# Patient Record
Sex: Female | Born: 1960 | Race: White | Hispanic: No | Marital: Single | Smoking: Never smoker
Health system: Southern US, Community
[De-identification: ages and names within clinical notes are randomized; demographics above are authoritative.]

## PROBLEM LIST (undated history)

## (undated) DIAGNOSIS — M1990 Unspecified osteoarthritis, unspecified site: Secondary | ICD-10-CM

## (undated) DIAGNOSIS — G35 Multiple sclerosis: Secondary | ICD-10-CM

## (undated) DIAGNOSIS — Z789 Other specified health status: Secondary | ICD-10-CM

## (undated) DIAGNOSIS — R350 Frequency of micturition: Secondary | ICD-10-CM

## (undated) DIAGNOSIS — R413 Other amnesia: Secondary | ICD-10-CM

## (undated) DIAGNOSIS — K269 Duodenal ulcer, unspecified as acute or chronic, without hemorrhage or perforation: Secondary | ICD-10-CM

## (undated) DIAGNOSIS — N319 Neuromuscular dysfunction of bladder, unspecified: Secondary | ICD-10-CM

## (undated) DIAGNOSIS — R269 Unspecified abnormalities of gait and mobility: Secondary | ICD-10-CM

## (undated) HISTORY — DX: Frequency of micturition: R35.0

## (undated) HISTORY — DX: Unspecified abnormalities of gait and mobility: R26.9

## (undated) HISTORY — DX: Neuromuscular dysfunction of bladder, unspecified: N31.9

## (undated) HISTORY — DX: Multiple sclerosis: G35

## (undated) HISTORY — DX: Other amnesia: R41.3

## (undated) HISTORY — PX: GALLBLADDER SURGERY: SHX652

## (undated) HISTORY — PX: CHOLECYSTECTOMY: SHX55

## (undated) HISTORY — DX: Unspecified osteoarthritis, unspecified site: M19.90

---

## 2013-04-19 DIAGNOSIS — G3184 Mild cognitive impairment, so stated: Secondary | ICD-10-CM | POA: Insufficient documentation

## 2013-04-19 DIAGNOSIS — R300 Dysuria: Secondary | ICD-10-CM | POA: Insufficient documentation

## 2013-07-05 ENCOUNTER — Ambulatory Visit: Payer: Self-pay | Admitting: Family Medicine

## 2013-09-24 ENCOUNTER — Encounter: Payer: Self-pay | Admitting: Neurology

## 2013-09-25 ENCOUNTER — Telehealth: Payer: Self-pay | Admitting: Neurology

## 2013-09-25 NOTE — Telephone Encounter (Signed)
ERROR

## 2013-09-26 ENCOUNTER — Ambulatory Visit (INDEPENDENT_AMBULATORY_CARE_PROVIDER_SITE_OTHER): Payer: Medicare HMO | Admitting: Neurology

## 2013-09-26 ENCOUNTER — Encounter: Payer: Self-pay | Admitting: Neurology

## 2013-09-26 VITALS — BP 118/80 | HR 66 | Ht 65.0 in | Wt 159.0 lb

## 2013-09-26 DIAGNOSIS — R269 Unspecified abnormalities of gait and mobility: Secondary | ICD-10-CM | POA: Insufficient documentation

## 2013-09-26 DIAGNOSIS — G35 Multiple sclerosis: Secondary | ICD-10-CM | POA: Insufficient documentation

## 2013-09-26 HISTORY — DX: Multiple sclerosis: G35

## 2013-09-26 HISTORY — DX: Unspecified abnormalities of gait and mobility: R26.9

## 2013-09-26 NOTE — Progress Notes (Signed)
Reason for visit: Multiple sclerosis  Adrienne Strickland is a 52 y.o. female  History of present illness:  Adrienne Strickland is a 52 year old left-handed white female with a history of multiple sclerosis that was initially diagnosed in 1995 following a bout of optic neuritis affecting the left eye. The patient indicates that she was initially placed on Avonex, but then she was changed to Betaseron because of some progression. The patient could not tolerate Betaseron, and she was switched to Copaxone, and she was having issues with progression on this medication as well. Eventually in November 2006, the patient was placed on Tysabri. 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 The patient went off the medication at that time, and within 3-4 months, the patient began having significant problems with progression of symptoms. The patient underwent MRI evaluation of the brain showing ring-enhancing lesions, and the patient had MRI evaluation of the cervical spine that revealed spinal cord lesions as well with some question of cord compression at C4 level. Cord signal was noted at this level as well. The patient has cortical atrophy and ventriculomegaly associated with her disease. Currently, the patient is on Tecfidera, and she has been on this medication for several months as she did not feel well on Gilenya that was started in January 2014. The patient has had some cognitive changes off of Tysabri, along with fatigue, spasticity the legs, and worsening of gait. The patient notes that her right leg will go into spasms, but she is only taking baclofen on an as-needed basis. The patient is using a cane in and outside the house, with occasional falls. The patient has some urinary incontinence, and she has blurring of vision  bilaterally. The patient reports no new numbness or weakness of the face, arms, or legs. The patient takes Ampyra for her gait. The patient is sent to this office for a second opinion regarding the treatment of her multiple sclerosis.  Past Medical History  Diagnosis Date  . MS (multiple sclerosis)   . Arthritis   . Multiple sclerosis 09/26/2013  . Abnormality of gait 09/26/2013  . Memory difficulty   . Neurogenic bladder     Past Surgical History  Procedure Laterality Date  . Gallbladder surgery      Family History  Problem Relation Age of Onset  . Seizures Mother   . Emphysema Father   . Heart disease Brother     Social history:  reports that she has never smoked. She has never used smokeless tobacco. She reports that  drinks alcohol. She reports that she does not use illicit drugs.  Medications:  Current Outpatient Prescriptions on File Prior to Visit  Medication Sig Dispense Refill  . baclofen (LIORESAL) 10 MG tablet Take 10 mg by mouth every 6 (six) hours.       Marland Kitchen dalfampridine (AMPYRA) 10 MG TB12 Take 10 mg by mouth 2 (two) times daily.      . Dimethyl Fumarate (TECFIDERA) 240 MG CPDR Take 240 mg by mouth 2 (two) times daily.       No current facility-administered medications on file prior to visit.      Allergies  Allergen Reactions  . Sulfa Antibiotics Other (See Comments)  . Tetanus Toxoids     ROS:  Out of a complete 14 system  review of symptoms, the patient complains only of the following symptoms, and all other reviewed systems are negative.  Fatigue Swelling in the legs Incontinence of bladder Flushing Runny nose  Blood pressure 118/80, pulse 66, height 5\' 5"  (1.651 m), weight 159 lb (72.122 kg).  Physical Exam  General: The patient is alert and cooperative at the time of the examination.  Head: Pupils are equal, round, and reactive to light. Discs are flat bilaterally. Some optic atrophy is seen on the right.  Neck: The neck is supple, no  carotid bruits are noted.  Respiratory: The respiratory examination is clear.  Cardiovascular: The cardiovascular examination reveals a regular rate and rhythm, no obvious murmurs or rubs are noted.  Skin: Extremities are with 1+ edema ankles bilaterally.  Neurologic Exam  Mental status:  Cranial nerves: Facial symmetry is present. There is good sensation of the face to pinprick and soft touch bilaterally. The strength of the facial muscles and the muscles to head turning and shoulder shrug are normal bilaterally. Speech is well enunciated, no aphasia or dysarthria is noted. Extraocular movements are full. Visual fields are full. Titubations of the head and upper body are noted.  Motor: The motor testing reveals 5 over 5 strength of all 4 extremities. Good symmetric motor tone is noted throughout.  Sensory: Sensory testing is intact to pinprick, soft touch, vibration sensation, and position sense on all 4 extremities, with the exception that there is some decrease in vibration sensation and position sensation of the right foot. No evidence of extinction is noted.  Coordination: Cerebellar testing reveals a mild intention tremor, dysmetria involving finger-nose-finger bilaterally, and heel-to-shin bilaterally.  Gait and station: Gait is associated with a wide-based, unsteady stance. The patient uses a cane for ambulation. Tandem gait is slightly unsteady. Romberg is negative. No drift is seen.  Reflexes: Deep tendon reflexes are symmetric, but are slightly increased bilaterally. Toes are upgoing bilaterally.   Assessment/Plan:  1. Multiple sclerosis  2. Memory disorder  3. Gait disorder  The patient has significant deficits associated with her multiple sclerosis with a very active MRI evaluation of the brain and cervical spinal cord in April 2014. The patient is now on Tecfidera, and she is tolerating the medication fairly well. The severe exacerbation seen previously this year may in  part have been related to the withdrawal from Tysabri. The patient will be sent for MRI evaluation of the brain and cervical spinal cord. If the patient does truly have spinal cord compression, surgery may be indicated. The patient is at high risk for PML with Tysabri therapy. Even so, Tysabri could be considered if the patient has significant worsening of disease, and she fully understands the risks involved with the use of this medication, and she is willing to accept these risks. The patient will followup through this office if needed.  Marlan Palau MD 09/26/2013 7:19 PM  [facility] [address] [place], [place] [address]  Phone [phone] Fax [phone]

## 2013-10-26 ENCOUNTER — Ambulatory Visit
Admission: RE | Admit: 2013-10-26 | Discharge: 2013-10-26 | Disposition: A | Discharge status: Home / Self Care | Payer: Medicare Other | Source: Ambulatory Visit | Attending: Neurology | Admitting: Neurology

## 2013-10-26 ENCOUNTER — Ambulatory Visit
Admission: RE | Admit: 2013-10-26 | Discharge: 2013-10-26 | Disposition: A | Discharge status: Home / Self Care | Payer: Medicare HMO | Source: Ambulatory Visit | Attending: Neurology | Admitting: Neurology

## 2013-10-26 DIAGNOSIS — R269 Unspecified abnormalities of gait and mobility: Secondary | ICD-10-CM

## 2013-10-26 DIAGNOSIS — G35 Multiple sclerosis: Secondary | ICD-10-CM

## 2013-10-26 MED ORDER — GADOBENATE DIMEGLUMINE 529 MG/ML IV SOLN
15.0000 mL | Freq: Once | INTRAVENOUS | Status: AC | PRN
  Administered 2013-10-26: 15 mL via INTRAVENOUS

## 2013-10-28 ENCOUNTER — Telehealth: Payer: Self-pay | Admitting: Neurology

## 2013-10-28 NOTE — Telephone Encounter (Signed)
I called patient. The MRI the cervical spine does show some moderate level spinal stenosis without critical cord compression. MRI the brain shows multiple enhancing lesions suggesting that the MS is rather active. The patient has been on Tecfidera for about 3 months. The patient indicates that the JC viral antibody index was extremely high, putting her at significant risk for PML. Going back on this medication could be done, but the patient may be sustaining a very high level of risk. The patient could not tolerate Gilenya secondary to cognitive issues, and switching to Aubagio probably will offer very little in improving disease progression. Would recommend potentially giving the patient Solu-Medrol injections once every 2 weeks or once a month. We'll continue to Tecfidera for now, considering repeating another MRI of the brain in 3 months with contrast.

## 2013-12-12 DIAGNOSIS — G35 Multiple sclerosis: Secondary | ICD-10-CM | POA: Diagnosis not present

## 2013-12-12 DIAGNOSIS — R9409 Abnormal results of other function studies of central nervous system: Secondary | ICD-10-CM | POA: Diagnosis not present

## 2013-12-13 DIAGNOSIS — G609 Hereditary and idiopathic neuropathy, unspecified: Secondary | ICD-10-CM | POA: Diagnosis not present

## 2013-12-13 DIAGNOSIS — G35 Multiple sclerosis: Secondary | ICD-10-CM | POA: Diagnosis not present

## 2013-12-13 DIAGNOSIS — N319 Neuromuscular dysfunction of bladder, unspecified: Secondary | ICD-10-CM | POA: Diagnosis not present

## 2013-12-13 DIAGNOSIS — F801 Expressive language disorder: Secondary | ICD-10-CM | POA: Diagnosis not present

## 2013-12-16 DIAGNOSIS — G609 Hereditary and idiopathic neuropathy, unspecified: Secondary | ICD-10-CM | POA: Diagnosis not present

## 2013-12-16 DIAGNOSIS — F801 Expressive language disorder: Secondary | ICD-10-CM | POA: Diagnosis not present

## 2013-12-16 DIAGNOSIS — G35 Multiple sclerosis: Secondary | ICD-10-CM | POA: Diagnosis not present

## 2013-12-16 DIAGNOSIS — N319 Neuromuscular dysfunction of bladder, unspecified: Secondary | ICD-10-CM | POA: Diagnosis not present

## 2013-12-17 DIAGNOSIS — F801 Expressive language disorder: Secondary | ICD-10-CM | POA: Diagnosis not present

## 2013-12-17 DIAGNOSIS — G35 Multiple sclerosis: Secondary | ICD-10-CM | POA: Diagnosis not present

## 2013-12-17 DIAGNOSIS — N319 Neuromuscular dysfunction of bladder, unspecified: Secondary | ICD-10-CM | POA: Diagnosis not present

## 2013-12-17 DIAGNOSIS — G609 Hereditary and idiopathic neuropathy, unspecified: Secondary | ICD-10-CM | POA: Diagnosis not present

## 2013-12-18 DIAGNOSIS — G35 Multiple sclerosis: Secondary | ICD-10-CM | POA: Diagnosis not present

## 2013-12-18 DIAGNOSIS — N319 Neuromuscular dysfunction of bladder, unspecified: Secondary | ICD-10-CM | POA: Diagnosis not present

## 2013-12-18 DIAGNOSIS — G609 Hereditary and idiopathic neuropathy, unspecified: Secondary | ICD-10-CM | POA: Diagnosis not present

## 2013-12-18 DIAGNOSIS — F801 Expressive language disorder: Secondary | ICD-10-CM | POA: Diagnosis not present

## 2013-12-20 DIAGNOSIS — G609 Hereditary and idiopathic neuropathy, unspecified: Secondary | ICD-10-CM | POA: Diagnosis not present

## 2013-12-20 DIAGNOSIS — F801 Expressive language disorder: Secondary | ICD-10-CM | POA: Diagnosis not present

## 2013-12-20 DIAGNOSIS — N319 Neuromuscular dysfunction of bladder, unspecified: Secondary | ICD-10-CM | POA: Diagnosis not present

## 2013-12-20 DIAGNOSIS — G35 Multiple sclerosis: Secondary | ICD-10-CM | POA: Diagnosis not present

## 2013-12-23 DIAGNOSIS — G35 Multiple sclerosis: Secondary | ICD-10-CM | POA: Diagnosis not present

## 2013-12-23 DIAGNOSIS — Z124 Encounter for screening for malignant neoplasm of cervix: Secondary | ICD-10-CM | POA: Diagnosis not present

## 2013-12-23 DIAGNOSIS — Z01419 Encounter for gynecological examination (general) (routine) without abnormal findings: Secondary | ICD-10-CM | POA: Diagnosis not present

## 2013-12-23 DIAGNOSIS — Z5181 Encounter for therapeutic drug level monitoring: Secondary | ICD-10-CM | POA: Diagnosis not present

## 2013-12-23 DIAGNOSIS — N319 Neuromuscular dysfunction of bladder, unspecified: Secondary | ICD-10-CM | POA: Diagnosis not present

## 2013-12-23 DIAGNOSIS — Z79899 Other long term (current) drug therapy: Secondary | ICD-10-CM | POA: Diagnosis not present

## 2013-12-23 DIAGNOSIS — R8789 Other abnormal findings in specimens from female genital organs: Secondary | ICD-10-CM | POA: Diagnosis not present

## 2013-12-24 DIAGNOSIS — F801 Expressive language disorder: Secondary | ICD-10-CM | POA: Diagnosis not present

## 2013-12-24 DIAGNOSIS — N319 Neuromuscular dysfunction of bladder, unspecified: Secondary | ICD-10-CM | POA: Diagnosis not present

## 2013-12-24 DIAGNOSIS — G35 Multiple sclerosis: Secondary | ICD-10-CM | POA: Diagnosis not present

## 2013-12-24 DIAGNOSIS — G609 Hereditary and idiopathic neuropathy, unspecified: Secondary | ICD-10-CM | POA: Diagnosis not present

## 2013-12-26 DIAGNOSIS — G35 Multiple sclerosis: Secondary | ICD-10-CM | POA: Diagnosis not present

## 2013-12-26 DIAGNOSIS — F801 Expressive language disorder: Secondary | ICD-10-CM | POA: Diagnosis not present

## 2013-12-26 DIAGNOSIS — G609 Hereditary and idiopathic neuropathy, unspecified: Secondary | ICD-10-CM | POA: Diagnosis not present

## 2013-12-26 DIAGNOSIS — N319 Neuromuscular dysfunction of bladder, unspecified: Secondary | ICD-10-CM | POA: Diagnosis not present

## 2013-12-30 DIAGNOSIS — G35 Multiple sclerosis: Secondary | ICD-10-CM | POA: Diagnosis not present

## 2013-12-30 DIAGNOSIS — G609 Hereditary and idiopathic neuropathy, unspecified: Secondary | ICD-10-CM | POA: Diagnosis not present

## 2013-12-30 DIAGNOSIS — N319 Neuromuscular dysfunction of bladder, unspecified: Secondary | ICD-10-CM | POA: Diagnosis not present

## 2013-12-30 DIAGNOSIS — F801 Expressive language disorder: Secondary | ICD-10-CM | POA: Diagnosis not present

## 2013-12-31 DIAGNOSIS — G35 Multiple sclerosis: Secondary | ICD-10-CM | POA: Diagnosis not present

## 2013-12-31 DIAGNOSIS — F801 Expressive language disorder: Secondary | ICD-10-CM | POA: Diagnosis not present

## 2013-12-31 DIAGNOSIS — N319 Neuromuscular dysfunction of bladder, unspecified: Secondary | ICD-10-CM | POA: Diagnosis not present

## 2013-12-31 DIAGNOSIS — N760 Acute vaginitis: Secondary | ICD-10-CM | POA: Diagnosis not present

## 2013-12-31 DIAGNOSIS — G609 Hereditary and idiopathic neuropathy, unspecified: Secondary | ICD-10-CM | POA: Diagnosis not present

## 2014-01-01 DIAGNOSIS — G609 Hereditary and idiopathic neuropathy, unspecified: Secondary | ICD-10-CM | POA: Diagnosis not present

## 2014-01-01 DIAGNOSIS — F801 Expressive language disorder: Secondary | ICD-10-CM | POA: Diagnosis not present

## 2014-01-01 DIAGNOSIS — G35 Multiple sclerosis: Secondary | ICD-10-CM | POA: Diagnosis not present

## 2014-01-01 DIAGNOSIS — N319 Neuromuscular dysfunction of bladder, unspecified: Secondary | ICD-10-CM | POA: Diagnosis not present

## 2014-01-03 DIAGNOSIS — N319 Neuromuscular dysfunction of bladder, unspecified: Secondary | ICD-10-CM | POA: Diagnosis not present

## 2014-01-03 DIAGNOSIS — G35 Multiple sclerosis: Secondary | ICD-10-CM | POA: Diagnosis not present

## 2014-01-03 DIAGNOSIS — G609 Hereditary and idiopathic neuropathy, unspecified: Secondary | ICD-10-CM | POA: Diagnosis not present

## 2014-01-03 DIAGNOSIS — F801 Expressive language disorder: Secondary | ICD-10-CM | POA: Diagnosis not present

## 2014-01-06 DIAGNOSIS — N319 Neuromuscular dysfunction of bladder, unspecified: Secondary | ICD-10-CM | POA: Diagnosis not present

## 2014-01-06 DIAGNOSIS — G35 Multiple sclerosis: Secondary | ICD-10-CM | POA: Diagnosis not present

## 2014-01-06 DIAGNOSIS — F801 Expressive language disorder: Secondary | ICD-10-CM | POA: Diagnosis not present

## 2014-01-06 DIAGNOSIS — G609 Hereditary and idiopathic neuropathy, unspecified: Secondary | ICD-10-CM | POA: Diagnosis not present

## 2014-01-07 DIAGNOSIS — F801 Expressive language disorder: Secondary | ICD-10-CM | POA: Diagnosis not present

## 2014-01-07 DIAGNOSIS — G609 Hereditary and idiopathic neuropathy, unspecified: Secondary | ICD-10-CM | POA: Diagnosis not present

## 2014-01-07 DIAGNOSIS — G35 Multiple sclerosis: Secondary | ICD-10-CM | POA: Diagnosis not present

## 2014-01-07 DIAGNOSIS — N319 Neuromuscular dysfunction of bladder, unspecified: Secondary | ICD-10-CM | POA: Diagnosis not present

## 2014-01-09 DIAGNOSIS — N319 Neuromuscular dysfunction of bladder, unspecified: Secondary | ICD-10-CM | POA: Diagnosis not present

## 2014-01-09 DIAGNOSIS — G35 Multiple sclerosis: Secondary | ICD-10-CM | POA: Diagnosis not present

## 2014-01-09 DIAGNOSIS — G609 Hereditary and idiopathic neuropathy, unspecified: Secondary | ICD-10-CM | POA: Diagnosis not present

## 2014-01-09 DIAGNOSIS — F801 Expressive language disorder: Secondary | ICD-10-CM | POA: Diagnosis not present

## 2014-01-10 DIAGNOSIS — R269 Unspecified abnormalities of gait and mobility: Secondary | ICD-10-CM | POA: Diagnosis not present

## 2014-01-10 DIAGNOSIS — G35 Multiple sclerosis: Secondary | ICD-10-CM | POA: Diagnosis not present

## 2014-01-10 DIAGNOSIS — Z139 Encounter for screening, unspecified: Secondary | ICD-10-CM | POA: Diagnosis not present

## 2014-01-13 DIAGNOSIS — N319 Neuromuscular dysfunction of bladder, unspecified: Secondary | ICD-10-CM | POA: Diagnosis not present

## 2014-01-13 DIAGNOSIS — G609 Hereditary and idiopathic neuropathy, unspecified: Secondary | ICD-10-CM | POA: Diagnosis not present

## 2014-01-13 DIAGNOSIS — G35 Multiple sclerosis: Secondary | ICD-10-CM | POA: Diagnosis not present

## 2014-01-13 DIAGNOSIS — F801 Expressive language disorder: Secondary | ICD-10-CM | POA: Diagnosis not present

## 2014-01-15 DIAGNOSIS — N319 Neuromuscular dysfunction of bladder, unspecified: Secondary | ICD-10-CM | POA: Diagnosis not present

## 2014-01-15 DIAGNOSIS — G35 Multiple sclerosis: Secondary | ICD-10-CM | POA: Diagnosis not present

## 2014-01-15 DIAGNOSIS — F801 Expressive language disorder: Secondary | ICD-10-CM | POA: Diagnosis not present

## 2014-01-15 DIAGNOSIS — G609 Hereditary and idiopathic neuropathy, unspecified: Secondary | ICD-10-CM | POA: Diagnosis not present

## 2014-01-16 DIAGNOSIS — F801 Expressive language disorder: Secondary | ICD-10-CM | POA: Diagnosis not present

## 2014-01-16 DIAGNOSIS — G35 Multiple sclerosis: Secondary | ICD-10-CM | POA: Diagnosis not present

## 2014-01-16 DIAGNOSIS — N319 Neuromuscular dysfunction of bladder, unspecified: Secondary | ICD-10-CM | POA: Diagnosis not present

## 2014-01-16 DIAGNOSIS — G609 Hereditary and idiopathic neuropathy, unspecified: Secondary | ICD-10-CM | POA: Diagnosis not present

## 2014-01-17 DIAGNOSIS — F801 Expressive language disorder: Secondary | ICD-10-CM | POA: Diagnosis not present

## 2014-01-17 DIAGNOSIS — N319 Neuromuscular dysfunction of bladder, unspecified: Secondary | ICD-10-CM | POA: Diagnosis not present

## 2014-01-17 DIAGNOSIS — G35 Multiple sclerosis: Secondary | ICD-10-CM | POA: Diagnosis not present

## 2014-01-17 DIAGNOSIS — G609 Hereditary and idiopathic neuropathy, unspecified: Secondary | ICD-10-CM | POA: Diagnosis not present

## 2014-01-20 DIAGNOSIS — F801 Expressive language disorder: Secondary | ICD-10-CM | POA: Diagnosis not present

## 2014-01-20 DIAGNOSIS — G609 Hereditary and idiopathic neuropathy, unspecified: Secondary | ICD-10-CM | POA: Diagnosis not present

## 2014-01-20 DIAGNOSIS — N319 Neuromuscular dysfunction of bladder, unspecified: Secondary | ICD-10-CM | POA: Diagnosis not present

## 2014-01-20 DIAGNOSIS — G35 Multiple sclerosis: Secondary | ICD-10-CM | POA: Diagnosis not present

## 2014-01-21 DIAGNOSIS — F801 Expressive language disorder: Secondary | ICD-10-CM | POA: Diagnosis not present

## 2014-01-21 DIAGNOSIS — G35 Multiple sclerosis: Secondary | ICD-10-CM | POA: Diagnosis not present

## 2014-01-21 DIAGNOSIS — G609 Hereditary and idiopathic neuropathy, unspecified: Secondary | ICD-10-CM | POA: Diagnosis not present

## 2014-01-21 DIAGNOSIS — N319 Neuromuscular dysfunction of bladder, unspecified: Secondary | ICD-10-CM | POA: Diagnosis not present

## 2014-01-22 DIAGNOSIS — F801 Expressive language disorder: Secondary | ICD-10-CM | POA: Diagnosis not present

## 2014-01-22 DIAGNOSIS — G35 Multiple sclerosis: Secondary | ICD-10-CM | POA: Diagnosis not present

## 2014-01-22 DIAGNOSIS — G609 Hereditary and idiopathic neuropathy, unspecified: Secondary | ICD-10-CM | POA: Diagnosis not present

## 2014-01-22 DIAGNOSIS — N319 Neuromuscular dysfunction of bladder, unspecified: Secondary | ICD-10-CM | POA: Diagnosis not present

## 2014-01-24 DIAGNOSIS — G35 Multiple sclerosis: Secondary | ICD-10-CM | POA: Diagnosis not present

## 2014-01-24 DIAGNOSIS — F801 Expressive language disorder: Secondary | ICD-10-CM | POA: Diagnosis not present

## 2014-01-24 DIAGNOSIS — N319 Neuromuscular dysfunction of bladder, unspecified: Secondary | ICD-10-CM | POA: Diagnosis not present

## 2014-01-24 DIAGNOSIS — G609 Hereditary and idiopathic neuropathy, unspecified: Secondary | ICD-10-CM | POA: Diagnosis not present

## 2014-01-30 DIAGNOSIS — F801 Expressive language disorder: Secondary | ICD-10-CM | POA: Diagnosis not present

## 2014-01-30 DIAGNOSIS — N319 Neuromuscular dysfunction of bladder, unspecified: Secondary | ICD-10-CM | POA: Diagnosis not present

## 2014-01-30 DIAGNOSIS — G609 Hereditary and idiopathic neuropathy, unspecified: Secondary | ICD-10-CM | POA: Diagnosis not present

## 2014-01-30 DIAGNOSIS — G35 Multiple sclerosis: Secondary | ICD-10-CM | POA: Diagnosis not present

## 2014-04-12 DIAGNOSIS — G35 Multiple sclerosis: Secondary | ICD-10-CM | POA: Diagnosis not present

## 2014-04-12 DIAGNOSIS — IMO0001 Reserved for inherently not codable concepts without codable children: Secondary | ICD-10-CM | POA: Diagnosis not present

## 2014-04-17 DIAGNOSIS — G35 Multiple sclerosis: Secondary | ICD-10-CM | POA: Diagnosis not present

## 2014-04-17 DIAGNOSIS — IMO0001 Reserved for inherently not codable concepts without codable children: Secondary | ICD-10-CM | POA: Diagnosis not present

## 2014-04-18 DIAGNOSIS — IMO0001 Reserved for inherently not codable concepts without codable children: Secondary | ICD-10-CM | POA: Diagnosis not present

## 2014-04-18 DIAGNOSIS — G35 Multiple sclerosis: Secondary | ICD-10-CM | POA: Diagnosis not present

## 2014-04-24 DIAGNOSIS — IMO0001 Reserved for inherently not codable concepts without codable children: Secondary | ICD-10-CM | POA: Diagnosis not present

## 2014-04-24 DIAGNOSIS — G35 Multiple sclerosis: Secondary | ICD-10-CM | POA: Diagnosis not present

## 2014-05-06 DIAGNOSIS — G35 Multiple sclerosis: Secondary | ICD-10-CM | POA: Diagnosis not present

## 2014-05-06 DIAGNOSIS — R269 Unspecified abnormalities of gait and mobility: Secondary | ICD-10-CM | POA: Diagnosis not present

## 2014-05-20 DIAGNOSIS — M79609 Pain in unspecified limb: Secondary | ICD-10-CM | POA: Insufficient documentation

## 2014-05-20 DIAGNOSIS — G35 Multiple sclerosis: Secondary | ICD-10-CM | POA: Diagnosis not present

## 2014-05-21 DIAGNOSIS — N319 Neuromuscular dysfunction of bladder, unspecified: Secondary | ICD-10-CM | POA: Diagnosis not present

## 2014-05-21 DIAGNOSIS — R198 Other specified symptoms and signs involving the digestive system and abdomen: Secondary | ICD-10-CM | POA: Diagnosis not present

## 2014-05-21 DIAGNOSIS — Z1211 Encounter for screening for malignant neoplasm of colon: Secondary | ICD-10-CM | POA: Diagnosis not present

## 2014-05-21 DIAGNOSIS — G35 Multiple sclerosis: Secondary | ICD-10-CM | POA: Diagnosis not present

## 2014-05-21 DIAGNOSIS — K5289 Other specified noninfective gastroenteritis and colitis: Secondary | ICD-10-CM | POA: Diagnosis not present

## 2014-05-21 DIAGNOSIS — Z01419 Encounter for gynecological examination (general) (routine) without abnormal findings: Secondary | ICD-10-CM | POA: Diagnosis not present

## 2014-05-22 ENCOUNTER — Other Ambulatory Visit: Payer: Self-pay | Admitting: Neurology

## 2014-05-22 DIAGNOSIS — G35 Multiple sclerosis: Secondary | ICD-10-CM

## 2014-05-27 ENCOUNTER — Other Ambulatory Visit: Payer: Self-pay | Admitting: Neurology

## 2014-05-27 DIAGNOSIS — G35 Multiple sclerosis: Secondary | ICD-10-CM

## 2014-05-28 ENCOUNTER — Emergency Department: Payer: Self-pay | Admitting: Emergency Medicine

## 2014-05-28 DIAGNOSIS — Z79899 Other long term (current) drug therapy: Secondary | ICD-10-CM | POA: Diagnosis not present

## 2014-05-28 DIAGNOSIS — G35 Multiple sclerosis: Secondary | ICD-10-CM | POA: Diagnosis not present

## 2014-05-28 DIAGNOSIS — R109 Unspecified abdominal pain: Secondary | ICD-10-CM | POA: Diagnosis not present

## 2014-05-28 DIAGNOSIS — K5900 Constipation, unspecified: Secondary | ICD-10-CM | POA: Diagnosis not present

## 2014-05-28 DIAGNOSIS — N319 Neuromuscular dysfunction of bladder, unspecified: Secondary | ICD-10-CM | POA: Diagnosis not present

## 2014-05-30 ENCOUNTER — Ambulatory Visit
Admission: RE | Admit: 2014-05-30 | Discharge: 2014-05-30 | Disposition: A | Discharge status: Home / Self Care | Payer: Medicare Other | Source: Ambulatory Visit | Attending: Neurology | Admitting: Neurology

## 2014-05-30 DIAGNOSIS — G35 Multiple sclerosis: Secondary | ICD-10-CM

## 2014-05-30 MED ORDER — GADOBENATE DIMEGLUMINE 529 MG/ML IV SOLN
13.0000 mL | Freq: Once | INTRAVENOUS | Status: AC | PRN
  Administered 2014-05-30: 13 mL via INTRAVENOUS

## 2014-09-09 DIAGNOSIS — R350 Frequency of micturition: Secondary | ICD-10-CM | POA: Diagnosis not present

## 2014-09-09 DIAGNOSIS — N319 Neuromuscular dysfunction of bladder, unspecified: Secondary | ICD-10-CM | POA: Diagnosis not present

## 2014-09-30 DIAGNOSIS — I10 Essential (primary) hypertension: Secondary | ICD-10-CM | POA: Diagnosis not present

## 2014-09-30 DIAGNOSIS — N319 Neuromuscular dysfunction of bladder, unspecified: Secondary | ICD-10-CM | POA: Diagnosis not present

## 2014-09-30 DIAGNOSIS — R350 Frequency of micturition: Secondary | ICD-10-CM | POA: Diagnosis not present

## 2014-11-18 DIAGNOSIS — Z1239 Encounter for other screening for malignant neoplasm of breast: Secondary | ICD-10-CM | POA: Diagnosis not present

## 2014-11-18 DIAGNOSIS — M79641 Pain in right hand: Secondary | ICD-10-CM | POA: Diagnosis not present

## 2014-11-18 DIAGNOSIS — Z23 Encounter for immunization: Secondary | ICD-10-CM | POA: Diagnosis not present

## 2014-12-01 DIAGNOSIS — G35 Multiple sclerosis: Secondary | ICD-10-CM | POA: Diagnosis not present

## 2014-12-23 DIAGNOSIS — G35 Multiple sclerosis: Secondary | ICD-10-CM | POA: Diagnosis not present

## 2014-12-23 DIAGNOSIS — R2681 Unsteadiness on feet: Secondary | ICD-10-CM | POA: Diagnosis not present

## 2015-01-20 DIAGNOSIS — M1990 Unspecified osteoarthritis, unspecified site: Secondary | ICD-10-CM | POA: Insufficient documentation

## 2015-02-27 ENCOUNTER — Inpatient Hospital Stay: Payer: Self-pay | Admitting: Internal Medicine

## 2015-02-27 DIAGNOSIS — K269 Duodenal ulcer, unspecified as acute or chronic, without hemorrhage or perforation: Secondary | ICD-10-CM

## 2015-02-27 HISTORY — DX: Duodenal ulcer, unspecified as acute or chronic, without hemorrhage or perforation: K26.9

## 2015-04-24 DIAGNOSIS — K08409 Partial loss of teeth, unspecified cause, unspecified class: Secondary | ICD-10-CM | POA: Insufficient documentation

## 2015-04-24 DIAGNOSIS — K0889 Other specified disorders of teeth and supporting structures: Secondary | ICD-10-CM | POA: Insufficient documentation

## 2015-04-24 DIAGNOSIS — K264 Chronic or unspecified duodenal ulcer with hemorrhage: Secondary | ICD-10-CM | POA: Insufficient documentation

## 2015-04-26 NOTE — Consult Note (Signed)
PATIENT NAME:  Adrienne Strickland, Adrienne Strickland MR#:  [id number] DATE OF BIRTH:  05-21-1961  DATE OF CONSULTATION:  02/27/2015  REFERRING PHYSICIAN:  Crissie Figures, MD CONSULTING PHYSICIAN:  Christena Deem, MD/Marabelle Cushman A. Arvilla Market, ANP (Adult Nurse Practitioner)  PRIMARY CARE PRACTITIONER:  Earna Coder. Hurd, MD  REASON FOR CONSULTATION: Hematemesis, anemia with blood loss.   HISTORY OF PRESENT ILLNESS: This 54 year old Caucasian female with a history of multiple sclerosis, chronic arthritis on chronic Etoladac and diclofenac reports a 1-week history of feeling weak. Two days ago, she developed acute episode of nausea followed quickly by vomiting red emesis x2. The first emesis was red. She did not have diarrhea initially. She did rest on Wednesday, continued to feel weak. She  woke up Thursday feeling a little better. She was able to eat a sandwich that evening. She went to bed and woke up at about 1:00 last night with acute episode of nausea and vomiting, more hematemesis, and this time she had horrible black diarrhea. The patient felt weak, dizzy, presyncopal and could not ambulate. EMS was called. She was found to have hypotension with pallor and diaphoresis. She was brought to the Emergency Room.   In the Emergency Room, she was found to have a hemoglobin of 7.3. She was given 2 liters of normal saline. Her vital signs improved. She has subsequently received 2 units of PRBC and her hemoglobin is now at 7.9. She has had no further vomiting or diarrhea since 1:00 this morning. She says she feels pretty well and is feeling stronger after the blood transfusion, although she is not ambulating.   The patient denies history of heartburn or reflux. She has never had a history of ulcers. No prior history of EGD. She did have a colonoscopy done 2 years ago in [place] and reports negative findings. She says she is not due for another one for 10 years. The patient does take chronic NSAIDs in the form of diclofenac as well as  Etodolac for many years. She has significant arthritis. The patient does not take a proton pump inhibitor.   PAST MEDICAL HISTORY:  1. 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 The patient took Tysabri for 6 years. She had to stop Tysabri secondary to a virus.  2. Chronic arthritis.  3. Neurogenic bladder on intermittent self-catheterization.   PAST SURGICAL HISTORY: Cholecystectomy.   ALLERGIES: SULFA AND TDAP.   FAMILY HISTORY: Father with gastric ulcers, neurofibromatosis, and he is deceased in his 107s with emphysema. Mother deceased in her 28s with blood pressure complications. She had a history of epilepsy as well as hypertension. To her knowledge, there is no family history of GI malignancies, ulcers, colon cancer, or colon polyps.   SOCIAL HISTORY: The patient lives with her significant other for 20 years. No children. She is disabled. She denies any tobacco history. She does drink a shot of whiskey mixed with [facility] about 3 to 4 times a week. She denies heavier alcohol use or substance abuse.   HOME MEDICATIONS:  1. B12 1000 mcg 1 daily.  2. Vitamin D3 1000 international units twice daily.  3. Ampyra 10 mg extended-release 1 tablet twice daily.  4. Diclofenac 50 mg one twice daily.  5. Tecfidera 240 mg delayed release 1 tablet twice daily.  6. Baclofen 10 mg 4 times daily.  7. Pirmella 7/7/7 triphasic 0.4/0.75 mg 1 daily.  8. Myrbetriq 25 mg extended-release daily.  9.   Etodolac  REVIEW OF SYSTEMS: Twelve systems reviewed. Pertinent positives noted for generalized weakness, recent orthostatic dizziness associated with hematemesis, black stool as noted in the history. The patient has various problems related to her multiple sclerosis with problems with speaking, stuttering, vision, balance, and she walks with a cane. She denies any chest pain. She has had mild dyspnea on exertion that started last week associated with nonspecific  weakness. She denies cough, fevers, chills, sputum production or unusual weight loss. Remaining systems otherwise negative.   LABORATORY DATA: Admission blood work with glucose 151, BUN 29, creatinine 0.93, albumin 2.5. Liver panel otherwise normal. WBC 23.7, now down to 16.2. Hemoglobin 7.3 and after 2 units PRBC up to 7.9. Platelet count normal, 375,000 to 239,000. EKG is normal. No imaging studies to report.   IMPRESSION:  A 54 year old female patient with history of multiple sclerosis, chronic arthritis on chronic nonsteroidal anti-inflammatory drugs, neurogenic bladder with intermittent self-catheterization. Presents with a 1-day history of hematemesis, melena, and 1-week history of nonspecific weakness and mild dyspnea on exertion. She was found to have significant hypotension, pallor, diaphoresis, and near syncope on arrival in the Emergency Room. She had an admission hemoglobin of 7.3 and was given 2 liters of fluid and 2 units of PRBC. Hemoglobin has only increased to 7.9.   In addition, she presents with leukocytosis, which was thought to be stress related. Her urinalysis is positive for bacteria with history of intermittent self-catheterization and urine culture is pending.   She is maintained now on pantoprazole IV, no antibiotics at this point, and she has IV hydration. She remains n.p.o. in preparation for upper endoscopy.   PLAN: This case was discussed with Dr. Marva Panda in collaborative care. He does recommend an urgent upper endoscopy this afternoon. I counseled the patient regarding the upper endoscopy. We discussed risks, benefits, and possible complications. She is looking forward to pursuing this to establish the etiology for her likely upper GI bleed. We discussed possible causes for her symptoms to include gastric ulcer, erosive gastritis, peptic ulcer disease. She is up-to-date on colonoscopy per her report with most recent colonoscopy 2 years ago. Further GI recommendations  pending clinical course. She will likely need to receive another unit or 2 of PRBCs and continue on proton pump inhibitor after discharge. The patient is aware that she will need to stop taking her nonsteroidal anti-inflammatory drugs and she voices understanding.   Thank you for this consultation. These services provided by Cala Bradford A. Arvilla Market,  ANP, working with Dr. Barnetta Chapel   ____________________________ Ranae Plumber. Arvilla Market, ANP (Adult Nurse Practitioner) kam:ah D: 02/27/2015 17:46:52 ET T: 02/27/2015 18:02:17 ET JOB#: [id number]  cc: Cala Bradford A. Arvilla Market, ANP (Adult Nurse Practitioner), <Dictator> Ranae Plumber Suzette Battiest, MSN, ANP-BC Adult Nurse Practitioner ELECTRONICALLY SIGNED 02/27/2015 19:21

## 2015-04-26 NOTE — Discharge Summary (Signed)
PATIENT NAME:  Adrienne Strickland, Adrienne Strickland MR#:  [id number] DATE OF BIRTH:  09/08/1961  DATE OF ADMISSION:  02/27/2015 DATE OF DISCHARGE:  03/06/2015  ADMITTING DIAGNOSIS: Gastrointestinal bleed.   DISCHARGE DIAGNOSES:  1.  Acute bleeding duodenal ulcer.  2.  Acute hemorrhagic anemia, status post 7 units of packed red blood cell transfusion during this admission, status post 2 esophagogastroduodenoscopies on the 4th as well as on 03/02/2015 by Dr. Marva Panda revealing bowel gastritis LA grade B erosive esophagitis, duodenal ulcer with signs of bleeding. Helicobacter pylori negative.  3.  Generalized weakness.  4.  Orthostatic hypotension due to bleeding.  5.  Ascites due to Escherichia coli, pansensitive.  6.  Right ear cerumen.  7. History of multiple sclerosis, arthritis, neurogenic bladder with intermittent self-catheterization.   DISCHARGE CONDITION: Stable.   DISCHARGE MEDICATIONS: The patient is to continue:  1.  Tecfidera 240 mg p.o. twice daily times. 2.  Ampyra 10 mg extended release every 12 hours.  3.  Baclofen 10 mg 4 times daily. 4.  Pirmella 7/7/7 triphasic oral tablet 1 tablet once a day.  5.  Vitamin D3 at 1000 units twice daily.  6.  Vitamin B12 at 1000 mcg p.o. daily.  7.  Myrbetriq 25 mg extended release once daily.  8.  Oxycodone 5 mg every 6 hours as needed.  9.  Protonix 40 mg twice daily.  10.  The patient is not to take diclofenac.   HOME OXYGEN: None.   DIET: The patient's diet will be full liquid diet the next 2 days then slowly advanced to a low-residue diet, but diet consistency at present thin liquids as well as pureed.   ACTIVITY LIMITATIONS: As tolerated.   FOLLOWUP APPOINTMENTS: Dr. Marva Panda in 2 weeks after discharge. Dr. Greggory Stallion in 2 days after discharge.   SERVICES: The patient is referred to home health physical therapy upon discharge.    CONSULTANTS: Dr. Marva Panda, care management, social work, Dr. Juliann Pulse, Dr. Excell Seltzer.   RADIOLOGIC STUDIES: None.    HOSPITAL COURSE: The patient is a 54 year old Caucasian female with history of MS, arthritis who was on diclofenac at home, presents to the hospital with complaints of upper gastrointestinal bleed. Please refer to Dr. Jae Dire admission note on 02/27/2015. On arrival to the hospital, the patient was complaining of vomiting of blood as well as passing black stools. She also felt dizzy and had presyncopal episode. She was found to be hypotensive on arrival to the Emergency Room requiring IV fluid administration. After 2 L of IV fluids, the patient's blood pressure somewhat stabilized and her heart rate somewhat improved. The hospital services were contacted for admission. Her hemoglobin level was found to be low. On arrival to the hospital vital signs, temperature was 97.6, pulse was 120, respiration rate was 22, blood pressure 90/54 initially, oxygen saturation 100% on room air. Physical exam was remarkable for guaiac positive stool. The patient's lab data done on arrival to the Emergency Room revealed an elevated BUN to 29, glucose 151, bicarbonate level (CO2) level was only 17. Lipase level was 210, albumin is 2.5 and total protein was 5.5, otherwise comprehensive metabolic panel was unremarkable. Troponin was less than 0.02. White blood cell count was elevated to 23.7, hemoglobin was 7.3, and platelet count was 372,000. Absolute neutrophil count was not checked. The patient's urinalysis revealed yellow cloudy urine with 2+ bacteria, 1 red blood cell, 3 white blood cells. The patient was admitted to the hospital for further evaluation. She was typed and crossed and transfused,  initially 4 units of packed red blood cells, because of her ongoing bleeding. She was taken to EGD suite on 02/27/2015. EGD done by Dr. Marva Panda revealed bowel gastritis, LA grade C erosive esophagitis, one circumferential duodenal ulcer with clean base was noted. The patient was advised to continue IV Protonix. She continued to have  problems with recurrent bleeds and was taken for a repeat upper gastrointestinal endoscopy on 03/02/2015 by Dr. Marva Panda. EGD done during that time revealed LA grade B esophagitis, bowel gastritis, also 1 partially obstructing duodenal ulcer with hematin/ eschar in base with no visible vessel. The patient was initiated on clear liquid diet. Just a few days prior to leaving the hospital with a clear liquid diet she did relatively well. Her hemoglobin level was followed along while she was in the hospital. She was advanced to full liquid diet by 03/05/2015 and tolerated this diet well with no recurrent bleeding. Her hemoglobin remained stable. She was advised to continue a full liquid diet for the next few days after discharge and then slowly advance it to low-residue diet. She is to continue PPIs, Protonix twice daily. During all her stay in the hospital time, she required 7 units of packed red blood cell transfusion. She was checked for Helicobacter pylori and it was negative.   She was noted to have intermittent orthostatic hypotension requiring IV fluid administration or packed red blood cell transfusion.   She was evaluated by physical therapist in regard to generalized weakness and recommended home health physical therapy.   The patient's urinalysis, as mentioned above, was abnormal. Her urine was cultured and it grew 100,000 colony-forming units of Escherichia coli, which were pansensitive. She was given antibiotics IV and her symptoms resolved.   The patient was complaining of some right ear cerumen and difficulty hearing. She was given Debrox solution and her right ear hearing improved.   In regard to her past medical history such as MS, arthritis, neurogenic bladder, no significant changes were done; however, the patient was advised not ever take nonsteroidal anti-inflammatory medications such as diclofenac, aspirin, ibuprofen, Advil, Motrin, so forth.   The patient was followed while she was in  the hospital by Dr. Marva Panda. She is to follow up with Dr. Marva Panda in the next few weeks after discharge to set up to repeat EGD in the next 2 months after discharge. It is recommended to follow the patient's hemoglobin level as outpatient and make decisions about is getting initiation of iron supplements as outpatient. On the day of discharge, temperature was 97.7, pulse was 78, respiration was 18, blood pressure 119/66, saturation was 99% on room air at rest.   TIME SPENT: 40 minutes.    ____________________________ Katharina Caper, MD rv:bm D: 03/06/2015 18:11:15 ET T: 03/07/2015 02:36:57 ET JOB#: [id number]  cc: Katharina Caper, MD, <Dictator> Earna Coder. Greggory Stallion, MD Christena Deem, MD  Bellany Elbaum Winona Legato MD ELECTRONICALLY SIGNED 03/29/2015 19:46

## 2015-04-26 NOTE — Consult Note (Signed)
Chief Complaint:  Subjective/Chief Complaint patietn seen twice earlier today, delayed entry.  some mild lower abdominal discomfort, one episode small amount of very dark maroon liquid effluent. Passing flatus earlier without stool.   no other abdominal discomfort.  no nausea. tolerating clears.   VITAL SIGNS/ANCILLARY NOTES: **Vital Signs.:   06-Mar-16 12:00  Temperature Temperature (F) 98.9  Temperature Source oral  Pulse Pulse 93  Respirations Respirations 15  Systolic BP Systolic BP 509  Diastolic BP (mmHg) Diastolic BP (mmHg) 47  Mean BP 67  Pulse Ox % Pulse Ox % 99  Pulse Ox Activity Level  At rest  Oxygen Delivery Room Air/ 21 %  *Intake and Output.:   06-Mar-16 00:00  Stool  scant dark maroon stool   Brief Assessment:  Cardiac Regular  mild tachycardia   Respiratory clear BS   Gastrointestinal details normal Soft  Nontender  Nondistended  No masses palpable  Bowel sounds normal   Lab Results: Routine Chem:  06-Mar-16 05:30   Glucose, Serum 90  BUN 18  Creatinine (comp)  0.57  Sodium, Serum 144  Potassium, Serum 3.6  Chloride, Serum  113  CO2, Serum  19  Calcium (Total), Serum  7.1  Anion Gap 12  Osmolality (calc) 288  eGFR (African American) >60  eGFR (Non-African American) >60 (eGFR values <32m/min/1.73 m2 may be an indication of chronic kidney disease (CKD). Calculated eGFR, using the MRDR Study equation, is useful in  patients with stable renal function. The eGFR calculation will not be reliable in acutely ill patients when serum creatinine is changing rapidly. It is not useful in patients on dialysis. The eGFR calculation may not be applicable to patients at the low and high extremes of body sizes, pregnant women, and vegetarians.)  Routine Hem:  04-Mar-16 00:54   Hemoglobin (CBC)  7.3    08:28   Hemoglobin (CBC)  7.8    15:04   Hemoglobin (CBC)  7.9    20:28   Hemoglobin (CBC)  7.7  05-Mar-16 00:52   Hemoglobin (CBC)  5.8    07:15    Hemoglobin (CBC)  7.9    12:22   Hemoglobin (CBC)  7.7 (Result(s) reported on 28 Feb 2015 at 12:40PM.)    20:49   Hemoglobin (CBC)  9.0 (Result(s) reported on 28 Feb 2015 at 09:04PM.)  06-Mar-16 05:30   WBC (CBC)  16.8  RBC (CBC)  2.74  Hemoglobin (CBC)  7.9  Hematocrit (CBC)  24.0  Platelet Count (CBC) 201  MCV 87  MCH 28.9  MCHC 33.1  RDW 14.1  Neutrophil % 85.9  Lymphocyte % 8.7  Monocyte % 4.4  Eosinophil % 0.6  Basophil % 0.4  Neutrophil #  14.4  Lymphocyte # 1.5  Monocyte # 0.7  Eosinophil # 0.1  Basophil # 0.1 (Result(s) reported on 01 Mar 2015 at 06:54AM.)   Assessment/Plan:  Assessment/Plan:  Assessment 1) upper GI bleeding, EGD showing posterior bulbar duodenal ulcer, non-bleeding at that time.  recurrent episode early sat am,not recurrent.    2) multiple sclerosis, chronic arthritis-was taking both etodolac and diclofenac at home without ppi prophylaxis   Plan 1) continue iv ppi drip as you are. 2) clears, no red or carbonation.  following, will recheck hgb this afternoon.   Electronic Signatures: SLoistine Simas(MD)  (Signed 06-Mar-16 17:20)  Authored: Chief Complaint, VITAL SIGNS/ANCILLARY NOTES, Brief Assessment, Lab Results, Assessment/Plan   Last Updated: 06-Mar-16 17:20 by SLoistine Simas(MD)

## 2015-04-26 NOTE — Consult Note (Signed)
Brief Consult Note: Diagnosis: Acute anemia with hematemesis and melena with NSAID use.   Patient was seen by consultant.   Consult note dictated.   Recommend to proceed with surgery or procedure.   Orders entered.   Comments: She reports weakness and very mild DOE last weak.  No gi symptoms until 2 days ago with sudden n/v red fluid twice. She did not eat and rested.  Yesterday, she felt better and ate a sandwich in the evening. She developed nausea/vomiting bloody emesis and black diarrhea. She felt too weak to walk and her boyfriend carried her to the ER. Admission hgb 7.3 and after 2 units PRBC, Hgb is only at 7.9. No further vomiting or diarrhea since 0100. She has no abdominal pian or tenderness and is feeling better. No CP or SOB. Abdomen is soft and lungs are CTA. She is very cheerful and alert. Known hx of MS. PLAN: She needs urgent EGD and this case was d/w Dr. Marva Panda in collaboration of care. Full consult note pending.  Electronic Signatures: Rowan Blase (NP)  (Signed 04-Mar-16 16:14)  Authored: Brief Consult Note   Last Updated: 04-Mar-16 16:14 by Rowan Blase (NP)

## 2015-04-26 NOTE — Consult Note (Signed)
Chief Complaint:  Subjective/Chief Complaint seen for gi bleeding.  events of last noght noted.  No repeat bleeding/bm/hematemesis since about 1am,  denies abdominalpain, no further emesis.  henmodynamically stable, tachycardia noted, possibly related to MS/treatment as well as current clinical situation.   VITAL SIGNS/ANCILLARY NOTES: **Vital Signs.:   05-Mar-16 10:00  Temperature Temperature (F) 98.2  Temperature Source oral  Pulse Pulse 92  Respirations Respirations 26  Systolic BP Systolic BP 774  Diastolic BP (mmHg) Diastolic BP (mmHg) 51  Mean BP 67  Pulse Ox % Pulse Ox % 99  Pulse Ox Activity Level  At rest  Oxygen Delivery Room Air/ 21 %  *Intake and Output.:   05-Mar-16 00:55  Stool  loose large dark red stool   Brief Assessment:  Cardiac Regular   Respiratory clear BS   Gastrointestinal details normal Soft  Nontender  Nondistended  No masses palpable  Bowel sounds normal   Additional Physical Exam DRE- watery dark burgundy/black, not fresh appearing effluent.   Lab Results: Routine Chem:  04-Mar-16 00:54   BUN  29  05-Mar-16 07:15   Glucose, Serum  110  BUN 16  Creatinine (comp)  0.50  Sodium, Serum 143  Potassium, Serum 4.0  Chloride, Serum  116  CO2, Serum  19  Calcium (Total), Serum  6.4  Anion Gap 8  Osmolality (calc) 287  eGFR (African American) >60  eGFR (Non-African American) >60 (eGFR values <22m/min/1.73 m2 may be an indication of chronic kidney disease (CKD). Calculated eGFR, using the MRDR Study equation, is useful in  patients with stable renal function. The eGFR calculation will not be reliable in acutely ill patients when serum creatinine is changing rapidly. It is not useful in patients on dialysis. The eGFR calculation may not be applicable to patients at the low and high extremes of body sizes, pregnant women, and vegetarians.)  Result Comment CALCIUM - RESULTS VERIFIED BY REPEAT TESTING.  - NOTIFIED OF CRITICAL VALUE  - HP TO  BRITTANY RUDD @0758  ON 02/28/15  - READ-BACK PROCESS PERFORMED.  Result(s) reported on 28 Feb 2015 at 07:55AM.  Routine Sero:  05-Mar-16 06:00   Occult Blood, Feces POSITIVE (Result(s) reported on 28 Feb 2015 at 07:45AM.)  Routine Hem:  04-Mar-16 00:54   Hemoglobin (CBC)  7.3    08:28   Hemoglobin (CBC)  7.8    15:04   Hemoglobin (CBC)  7.9    20:28   Hemoglobin (CBC)  7.7  05-Mar-16 00:52   Hemoglobin (CBC)  5.8  WBC (CBC)  14.9  RBC (CBC)  1.98  Hematocrit (CBC)  17.5  Platelet Count (CBC) 281  MCV 88  MCH 29.2  MCHC 33.1  RDW 14.1  Neutrophil % 72.1  Lymphocyte % 20.1  Monocyte % 6.0  Eosinophil % 0.6  Basophil % 1.2  Neutrophil #  10.7  Lymphocyte # 3.0  Monocyte # 0.9  Eosinophil # 0.1  Basophil #  0.2 (Result(s) reported on 28 Feb 2015 at 01:06AM.)    07:15   Hemoglobin (CBC)  7.9  WBC (CBC)  17.7  RBC (CBC)  2.79  Hematocrit (CBC)  24.4  Platelet Count (CBC) 196 (Result(s) reported on 28 Feb 2015 at 07:56AM.)  MCV 88  MCH 28.3  MCHC 32.3  RDW 13.9  Bands 4  Segmented Neutrophils 86  Lymphocytes 5  Monocytes 4  Metamyelocyte 1  Diff Comment 1 LARGE PLATELETS  Diff Comment 2 PLTS VARIED IN SIZE  Diff Comment 3 RBCs APPEAR NORMAL  Result(s) reported on 28 Feb 2015 at 07:56AM.    12:22   Hemoglobin (CBC)  7.7 (Result(s) reported on 28 Feb 2015 at 12:40PM.)   Assessment/Plan:  Assessment/Plan:  Assessment 1) GI bleed -DU noted on egd, but no active bleeding.  atypical orientation of the gastric antrum and duodenal bulb.  recurrent bleeding last night,  with repeat melena and reported hematemesis.  not recurrent.  Hemodynamically stable, note tachycardia.   2) multiple sclerosis, chronic arthritis-was taking both etodolac and diclofenac at home without ppi prophylaxis   Plan 1) will give one unit of prbc, 2 units on hold, I have asked surgery to consult . Continue ICU observation .  serial hgb, will recheck full cbc in 6 hours.   Electronic  Signatures: Loistine Simas (MD)  (Signed 05-Mar-16 13:06)  Authored: Chief Complaint, VITAL SIGNS/ANCILLARY NOTES, Brief Assessment, Lab Results, Assessment/Plan   Last Updated: 05-Mar-16 13:06 by Loistine Simas (MD)

## 2015-04-26 NOTE — Consult Note (Signed)
Brief Consult Note: Diagnosis: PUD/GI bleed.   Patient was seen by consultant.   Consult note dictated.   Recommend further assessment or treatment.   Discussed with Attending MD.   Comments: duodenal bulb ulcer. H/H stable this afternoon. Will follow. Discussed with Dr Marva Panda.  Electronic Signatures: Lattie Haw (MD)  (Signed 05-Mar-16 12:48)  Authored: Brief Consult Note   Last Updated: 05-Mar-16 12:48 by Lattie Haw (MD)

## 2015-04-26 NOTE — H&P (Signed)
PATIENT NAME:  Adrienne Strickland, CAPPARELLI MR#:  [id number] DATE OF BIRTH:  12-12-1961  DATE OF ADMISSION:  02/27/2015  REFERRING PHYSICIAN: Rebecka Apley, MD    PRIMARY CARE PRACTITIONER: Earna Coder. Greggory Stallion, MD, of [place].  PRIMARY NEUROLOGIST: Paulita Fujita. Malvin Johns, MD  ADMITTING PHYSICIAN: Crissie Figures, MD    CHIEF COMPLAINT:  1.  Vomiting of blood of 1 day duration.  2.  Passing dark/black stools of 1 day duration.   HISTORY OF PRESENT ILLNESS: A 54 year old Caucasian female with a history of multiple sclerosis, chronic arthritis on chronic etodolac usage, neurogenic bladder on intermittent self-catheterization, presents to the Emergency Room with the complaints of vomiting blood and passing dark or black stool for the past 1 day. The patient states that she started having nausea and had an episode of vomiting, which was dark-colored the night prior to the day of arrival, which resolved on its own, and again developed last night with another episode of vomiting with dark-colored material and also have been noted to have passing of block stools of 1 day duration. While she was trying to get up and walk, she felt dizzy and had a presyncopal episode, but did not lose consciousness. EMS was called on site who found the patient to be with low blood pressure and pale and diaphoretic. Hence, was brought to the Emergency Room for further evaluation. In the Emergency Room, the patient, on arrival, was found to be hypotensive but alert, awake, and oriented. Workup revealed anemia secondary to acute blood loss with hemoglobin of 7.3. The patient was given 2 L of IV normal saline, following which her blood pressure stabilized and her heart rate also improved. The patient is currently waiting for PRBC transfusions. No history of any abdominal pain. She did have 2 loose stools but denies any diarrhea. No history of recent fever or cough. No chest pain. No shortness of breath. No prior episodes of hematemesis or GI bleeds or  passing of melena. As earlier, the patient stated that she has been taking etodolac for the past 2-3 years for her chronic arthritis. At the current time, the patient is comfortably resting in the bed and denies any complaints such as chest pain, shortness of breath, abdominal pain, nausea or vomiting.   PAST MEDICAL HISTORY:  1.  Multiple sclerosis.  2.  Chronic arthritis.  3.  Neurogenic bladder on intermittent self-catheterization.    PAST SURGICAL HISTORY: Cholecystectomy.   ALLERGIES:  1.  SULFA.  2.  TDAP.    FAMILY HISTORY: Father with history of gastric ulcers and neurofibromatosis and sister with heart disease and multiple family members with neurofibromatosis.   SOCIAL HISTORY: She is married, lives with her husband at home, currently disabled. No history of any smoking. Occasional alcohol intake. Denies any substance abuse.    HOME MEDICATIONS: Currently not listed.   REVIEW OF SYSTEMS:  CONSTITUTIONAL: Negative for fever or chills. No fatigue. No generalized weakness.  EYES: Negative for blurred vision, double vision. No pain. No redness. No discharge.  EARS, NOSE, AND THROAT: Negative for tinnitus, ear pain, hearing loss, epistaxis, nasal discharge.  RESPIRATORY: Negative for cough, wheezing, dyspnea, hemoptysis, or painful respiration.  CARDIOVASCULAR: Negative for chest pain. She did have dizziness, almost near syncopal episode, as noted in the history of present illness when she stood up. Denies any dyspnea on exertion or pedal edema.  GASTROINTESTINAL: Positive for nausea, vomiting, and hematemesis, and positive for having black stools of 1 day duration. No abdominal pain.  GENITOURINARY: History of chronic neurogenic bladder and does intermittent self-catheterization. Denies any dysuria.  ENDOCRINE: Negative for polyuria, nocturia, heat or cold intolerance.  HEMATOLOGIC AND LYMPHATIC: Negative for easy bruising, bleeding, or swollen glands.  INTEGUMENTARY: Negative for  acne, skin rash, or lesions.  MUSCULOSKELETAL: History of chronic arthritis for which she takes etodolac for the past 2-3 years. Currently denies any arthritic pains.  NEUROLOGICAL: Negative for focal weakness, numbness. No history of CVA, TIA, or seizure disorder.  PSYCHIATRIC: Negative for anxiety, depression,   PHYSICAL EXAMINATION:  VITAL SIGNS: Temperature 97.6 degrees Fahrenheit; pulse rate on arrival 120 beats per minute, current pulse rate is 111, respirations on arrival 22, current respirations 19 per minute; blood pressure on arrival 90/54, current blood pressure 119/57; oxygen saturation 100% on room air.  GENERAL: Well-developed, well-nourished, alert, no acute distress, comfortably resting in the bed.  HEAD: Atraumatic, normocephalic.  EYES: Pupils are equal, react to light and accommodation. Conjunctival pallor present. No icterus. Extraocular movements intact.  NOSE: No drainage. No lesions.  EARS: No drainage. No external lesions.  ORAL CAVITY: No mucosal lesions. No exudates.  NECK: Supple. No JVD. No thyromegaly. No carotid bruit. Range of motion of neck within normal limits.  RESPIRATORY: Good respiratory effort. Not using accessory muscles of respiration. Bilateral vesicular breath sounds present. No rales or rhonchi.  CARDIOVASCULAR: S1, S2 regular. Tachycardia present. No murmurs, gallops, or clicks. Pulses equal at carotid, femoral, and pedal pulses. No peripheral edema.  GASTROINTESTINAL: Abdomen soft, nontender. No hepatosplenomegaly. Bowel sounds present and equal in all 4 quadrants. No guarding. No rigidity.  GENITOURINARY: Deferred. Guaiac-positive, per ED physician's note.  MUSCULOSKELETAL: No joint tenderness or effusion. Range of motion is adequate. Strength and tone equal bilaterally.  SKIN: Inspection within normal limits.  LYMPHATIC: No cervical lymphadenopathy.  VASCULAR: Good dorsalis pedis and posterior tibial pulses.  NEUROLOGICAL: Alert, awake, and  oriented x 3. Cranial nerves II-XII grossly intact. No sensory deficit. Motor strength 5/5 in both upper and lower extremities.  PSYCHIATRIC: Alert, awake, and oriented x 3. Judgment and insight adequate. Memory and mood within normal limits.   ANCILLARY DATA:  LABORATORY DATA: Urine pregnancy test negative. Serum glucose 151. BMP unremarkable. LFTs unremarkable, except for total protein 5.5, albumin 2.5. Troponin less than 0.02. WBC 23.7, hemoglobin 7.3, hematocrit 23.3, platelet count 375,000. Urinalysis: Cloudy, leukocyte esterase trace, nitrite negative, WBC 3, bacteria 2+.   EKG: Sinus tachycardia with ventricular rate of 111 beats per minute.   ASSESSMENT AND PLAN: A 54 year old Caucasian female with a history of multiple sclerosis, chronic arthritis on chronic nonsteroidal anti-inflammatory usage, neurogenic bladder on intermittent self-catheterization, presents with 1 day history of hematemesis associated with dark/black stools. Had an episode of presyncope at home while she was trying to get up and workup in the Emergency Room revealed upper gastrointestinal bleed with anemia secondary due to blood loss with hemoglobin and hematocrit of 7.3 and 23, hypotensive on arrival, but blood pressure improved with normal saline.   1.  Upper gastrointestinal bleed with hematemesis and black stools of 1 day duration in the setting of chronic nonsteroidal anti-inflammatory usage. The patient hypotensive initially, currently blood pressure stable with intravenous fluids. PLAN: Admit to telemetry, intravenous fluids, transfuse 2 units of packed red blood cells. We will keep her n.p.o. Protonix drip and monitor serial hemoglobin and hematocrit. Gastroenterology consultation for further evaluation.  2.  Presyncope secondary to upper gastrointestinal bleed, hypotension on arrival improved with normal saline. The patient is stable at present. PLAN:  Intravenous fluids, packed red blood cells transfusion, and monitor  blood pressure closely.  3.  Anemia secondary to blood loss from upper gastrointestinal bleed. PLAN: Packed red blood cells transfusions, serial hematocrit and hemoglobin monitoring and gastroenterology consultation for further gastrointestinal workup.  4.  History of multiple sclerosis. Stable on home medications. List of home medications, not known at this time. PLAN: Obtain list of home medications and restart accordingly.  5.  Neurogenic bladder on intermittent self-catheterization at home, now with Foley. Continue same.  6.  History of arthritis on chronic nonsteroidal anti-inflammatory usage. Stop all nonsteroidal anti-inflammatories because of acute gastrointestinal bleed. No arthritic pains at this time. Monitor.  7.  Leukocytosis, likely stress related. The patient was not having any illness prior to the episode. Urinalysis 2+ bacteria, most likely related to intermittent self-catheterization. PLAN: We will obtain urine culture and sensitivities and follow up temperatures and followup WBC.  8.  Deep vein thrombosis prophylaxis. Sequential compression devices. No heparin because of acute gastrointestinal bleed.  9.  Gastrointestinal prophylaxis. Protonix drip.   CODE STATUS: Full code.   TIME SPENT: 50 minutes.    ____________________________ Crissie Figures, MD enr:bm D: 02/27/2015 03:36:07 ET T: 02/27/2015 05:11:36 ET JOB#: [id number]  cc: Crissie Figures, MD, <Dictator> Earna Coder. Greggory Stallion, MD Crissie Figures MD ELECTRONICALLY SIGNED 02/27/2015 10:35

## 2015-04-26 NOTE — Consult Note (Signed)
Chief Complaint:  Subjective/Chief Complaint Recommend holding whatever meds are not critically necessary to reduce possibility of nausea/emesis.  This includes tecfidera for her Multiple sclerosis, which does have frequent GI side effects necessitation recommendation to take with food.  I did discuss this with Neurology on call, shoule be no problems holding this medicine for now.   Electronic Signatures: Barnetta Chapel (MD)  (Signed 05-Mar-16 17:05)  Authored: Chief Complaint   Last Updated: 05-Mar-16 17:05 by Barnetta Chapel (MD)

## 2015-04-26 NOTE — Consult Note (Signed)
Chief Complaint:  Subjective/Chief Complaint Please see full EGD report.  No active bleeding.  Severe ulcer second portion of the duodenum, cratered.   Edge of ulcer improved from last evaluation.   Strict npo, may have ice chips.  Continue ICU obs for 2 days. Serial hgb, transfuse as needed.  ATC ondansetron 4 mg iv q 6 hours for 36 to 48 hours.   No other lesions noted except minimal esophagitis.   If she rebleeds  surgery may be necessary.   Electronic Signatures: Barnetta Chapel (MD)  (Signed 07-Mar-16 17:54)  Authored: Chief Complaint   Last Updated: 07-Mar-16 17:54 by Barnetta Chapel (MD)

## 2015-04-26 NOTE — Consult Note (Signed)
Chief Complaint:  Subjective/Chief Complaint seen for upper GI bleeding.  denies abd pain or nausea.  no emesis or bm   VITAL SIGNS/ANCILLARY NOTES: **Vital Signs.:   09-Mar-16 15:24  Vital Signs Type Q 4hr  Temperature Temperature (F) 98.3  Celsius 36.8  Temperature Source oral  Pulse Pulse 88  Respirations Respirations 20  Systolic BP Systolic BP 865  Diastolic BP (mmHg) Diastolic BP (mmHg) 65  Mean BP 86  Pulse Ox % Pulse Ox % 98  Pulse Ox Activity Level  At rest  Oxygen Delivery Room Air/ 21 %   Brief Assessment:  Cardiac Regular   Respiratory clear BS   Gastrointestinal details normal Soft  Nontender  Nondistended  Bowel sounds normal   Lab Results: Routine Chem:  09-Mar-16 05:46   Glucose, Serum 79 (65-99 NOTE: New Reference Range  02/17/15)  BUN 10 (6-20 NOTE: New Reference Range  02/17/15)  Creatinine (comp) 0.52 (0.44-1.00 NOTE: New Reference Range  02/17/15)  Sodium, Serum 139 (135-145 NOTE: New Reference Range  02/17/15)  Potassium, Serum 3.5 (3.5-5.1 NOTE: New Reference Range  02/17/15)  Chloride, Serum 109 (101-111 NOTE: New Reference Range  02/17/15)  CO2, Serum 23 (22-32 NOTE: New Reference Range  02/17/15)  Calcium (Total), Serum  7.7 (8.9-10.3 NOTE: New Reference Range  02/17/15)  Anion Gap 7  eGFR (African American) >60  eGFR (Non-African American) >60 (eGFR values <6m/min/1.73 m2 may be an indication of chronic kidney disease (CKD). Calculated eGFR is useful in patients with stable renal function. The eGFR calculation will not be reliable in acutely ill patients when serum creatinine is changing rapidly. It is not useful in patients on dialysis. The eGFR calculation may not be applicable to patients at the low and high extremes of body sizes, pregnant women, and vegetarians.)  Routine Hem:  09-Mar-16 05:46   WBC (CBC) 9.0  RBC (CBC)  2.74  Hemoglobin (CBC)  8.5  Hematocrit (CBC)  24.6  Platelet Count (CBC) 235  MCV 90  MCH  30.9  MCHC 34.4  RDW 14.5  Neutrophil % 77.2  Lymphocyte % 14.4  Monocyte % 5.7  Eosinophil % 2.0  Basophil % 0.7  Neutrophil #  7.0  Lymphocyte # 1.3  Monocyte # 0.5  Eosinophil # 0.2  Basophil # 0.1 (Result(s) reported on 04 Mar 2015 at 08:27AM.)   Assessment/Plan:  Assessment/Plan:  Assessment 1) upper gi bleeeding in setting of severe DU second portion of the duodenum   no repeat melena over 48 hours.   Plan 1) will restart clears, no red or carbonation, will restart po meds, no asa or nsaids.  2) h pylori serology 3) continue ppi drip for now.   Electronic Signatures: SLoistine Simas(MD)  (Signed 09-Mar-16 16:45)  Authored: Chief Complaint, VITAL SIGNS/ANCILLARY NOTES, Brief Assessment, Lab Results, Assessment/Plan   Last Updated: 09-Mar-16 16:45 by SLoistine Simas(MD)

## 2015-04-26 NOTE — Consult Note (Signed)
Chief Complaint:  Subjective/Chief Complaint Patient noted to have 2 unit drop of hgb overnight, one bloody bm last evening.  Patietn transferred to icu this am, 2 unit prbc to be transfused, plans for repeat egd after tfx complete.  Patietn with another bloody stool this am on arrival to icu, denies abdominal pain or nausea, no emesis.   VITAL SIGNS/ANCILLARY NOTES: **Vital Signs.:   07-Mar-16 07:41  Vital Signs Type Pre-Blood  Celsius 36.9  Temperature Source oral  Pulse Pulse 100  Respirations Respirations 18  Systolic BP Systolic BP 800  Diastolic BP (mmHg) Diastolic BP (mmHg) 67  Mean BP 94  BP Source  if not from Vital Sign Device non-invasive  Oxygen Delivery Room Air/ 21 %   Brief Assessment:  Cardiac tachycardia   Respiratory clear BS   Gastrointestinal details normal Soft  Nontender  Nondistended  No masses palpable  Bowel sounds normal   Lab Results: Routine Chem:  04-Mar-16 00:54   BUN  29  05-Mar-16 07:15   BUN 16  06-Mar-16 05:30   BUN 18  07-Mar-16 05:56   Glucose, Serum  100  BUN  19  Creatinine (comp)  0.55  Sodium, Serum 142  Potassium, Serum 3.9  Chloride, Serum  110  CO2, Serum 23  Calcium (Total), Serum  7.5  Anion Gap 9  Osmolality (calc) 285  eGFR (African American) >60  eGFR (Non-African American) >60 (eGFR values <22m/min/1.73 m2 may be an indication of chronic kidney disease (CKD). Calculated eGFR, using the MRDR Study equation, is useful in  patients with stable renal function. The eGFR calculation will not be reliable in acutely ill patients when serum creatinine is changing rapidly. It is not useful in patients on dialysis. The eGFR calculation may not be applicable to patients at the low and high extremes of body sizes, pregnant women, and vegetarians.)  Routine Hem:  04-Mar-16 00:54   Hemoglobin (CBC)  7.3    08:28   Hemoglobin (CBC)  7.8    15:04   Hemoglobin (CBC)  7.9    20:28   Hemoglobin (CBC)  7.7  05-Mar-16 00:52    Hemoglobin (CBC)  5.8    07:15   Hemoglobin (CBC)  7.9    12:22   Hemoglobin (CBC)  7.7 (Result(s) reported on 28 Feb 2015 at 12:40PM.)    20:49   Hemoglobin (CBC)  9.0 (Result(s) reported on 28 Feb 2015 at 09:04PM.)  06-Mar-16 05:30   Hemoglobin (CBC)  7.9    18:00   Hemoglobin (CBC)  7.5 (Result(s) reported on 01 Mar 2015 at 0[facility])  07-Mar-16 05:56   WBC (CBC)  12.5  RBC (CBC)  2.00  Hemoglobin (CBC)  5.9  Hemoglobin (CBC)  5.9 (Result(s) reported on 02 Mar 2015 at 07:50AM.)  Hematocrit (CBC)  17.7  Platelet Count (CBC) 220 (Result(s) reported on 02 Mar 2015 at 08:10AM.)  MCV 89  MCH 29.6  MCHC 33.4  RDW  14.7  Bands 7  Segmented Neutrophils 72  Lymphocytes 12  Monocytes 6  Eosinophil 1  Metamyelocyte 1  Myelocyte 1  Diff Comment 1 POLYCHROMASIA  Diff Comment 2 ROULEAUX  Diff Comment 3 NORMAL PLT MORPHOLGY  Result(s) reported on 02 Mar 2015 at 08:10AM.   Assessment/Plan:  Assessment/Plan:  Assessment as above.   Electronic Signatures: SLoistine Simas(MD)  (Signed 07-Mar-16 09:56)  Authored: Chief Complaint, VITAL SIGNS/ANCILLARY NOTES, Brief Assessment, Lab Results, Assessment/Plan   Last Updated: 07-Mar-16 09:56 by SLoistine Simas(MD)

## 2015-04-26 NOTE — Consult Note (Signed)
Chief Complaint:  Subjective/Chief Complaint seen for melena, hematemesis.  Patient seen and examined, please see full GI consult and brief consult note.   Patietn brought to [facility] after several days of hematemesis and black stools.  last emesis over 16 hours ago, last stool also over 16 hours ago.  Has nad no abdomina pain.   Has been taking multiple nsaids at home regularly without PPI prophylaxis.   No previous h/o pudz. hemodynamically stable.   Stable hgb from this am. s/p 2 unit prbc.  On iv ppi bid.   I have discussed the risks benefits and complicatiosn of egd to include not limited to bleeding infection perofration and sedation and she wishes to proceed.   further recs to follow.   VITAL SIGNS/ANCILLARY NOTES: **Vital Signs.:   04-Mar-16 11:35  Vital Signs Type Routine  Temperature Temperature (F) 99.3  Temperature Source oral  Pulse Pulse 109  Respirations Respirations 18  Systolic BP Systolic BP 268  Diastolic BP (mmHg) Diastolic BP (mmHg) 67  Mean BP 85  Pulse Ox % Pulse Ox % 98  Pulse Ox Activity Level  At rest  Oxygen Delivery Room Air/ 21 %   Brief Assessment:  Cardiac Regular  mild tachycardia   Respiratory clear BS   Gastrointestinal details normal Soft  Nontender  Nondistended   Lab Results: Hepatic:  04-Mar-16 00:54   Bilirubin, Total 0.2  Alkaline Phosphatase 58  SGPT (ALT) 23  SGOT (AST) 24  Total Protein, Serum  5.5  Albumin, Serum  2.5  Routine BB:  04-Mar-16 00:54   Crossmatch Unit 1 Ready  Crossmatch Unit 2 Issued  Crossmatch Unit 3 Issued  Crossmatch Unit 4 Ready (Result(s) reported on 27 Feb 2015 at 05:15AM.)  ABO Group + Rh Type O Positive  Antibody Screen NEGATIVE (Result(s) reported on 27 Feb 2015 at 02:33AM.)  Cardiology:  04-Mar-16 01:05   Ventricular Rate 111  Atrial Rate 111  P-R Interval 100  QRS Duration 74  QT 316  QTc 429  P Axis 25  R Axis 74  T Axis 56  ECG interpretation Sinus tachycardia with short PR Otherwise normal  ECG No previous ECGs available ----------unconfirmed---------- Confirmed by OVERREAD, NOT ([id number]), editor PEARSON, BARBARA ([id number]) on 02/27/2015 8:01:21 AM  Routine Chem:  04-Mar-16 00:54   Glucose, Serum  151  BUN  29  Creatinine (comp) 0.93  Sodium, Serum 137  Potassium, Serum 4.1  Chloride, Serum  108  CO2, Serum  17  Calcium (Total), Serum  8.2  Osmolality (calc) 283  eGFR (African American) >60  eGFR (Non-African American) >60 (eGFR values <69m/min/1.73 m2 may be an indication of chronic kidney disease (CKD). Calculated eGFR, using the MRDR Study equation, is useful in  patients with stable renal function. The eGFR calculation will not be reliable in acutely ill patients when serum creatinine is changing rapidly. It is not useful in patients on dialysis. The eGFR calculation may not be applicable to patients at the low and high extremes of body sizes, pregnant women, and vegetarians.)  Result Comment POTASSIUM,CREATININE,BUN - Slight hemolysis, interpret results with AST,TOTAL PROTEIN - caution.  Result(s) reported on 27 Feb 2015 at 02:21AM.  Anion Gap 12  Lipase 210 (Result(s) reported on 27 Feb 2015 at 02:21AM.)  Cardiac:  04-Mar-16 00:54   Troponin I < 0.02 (0.00-0.05 0.05 ng/mL or less: NEGATIVE  Repeat testing in 3-6 hrs  if clinically indicated. >0.05 ng/mL: POTENTIAL  MYOCARDIAL INJURY. Repeat  testing in 3-6 hrs if  clinically indicated. NOTE: An increase or decrease  of 30% or more on serial  testing suggests a  clinically important change)  Routine UA:  04-Mar-16 00:54   Color (UA) Yellow  Clarity (UA) Cloudy  Glucose (UA) Negative  Bilirubin (UA) Negative  Ketones (UA) Negative  Specific Gravity (UA) 1.013  Blood (UA) Negative  pH (UA) 5.0  Protein (UA) Negative  Nitrite (UA) Negative  Leukocyte Esterase (UA) Trace (Result(s) reported on 27 Feb 2015 at 02:31AM.)  RBC (UA) 1 /HPF  WBC (UA) 3 /HPF  Bacteria (UA) 2+  Epithelial Cells (UA) 1 /HPF   Mucous (UA) PRESENT  Hyaline Cast (UA) 26 /LPF  Granular Cast (UA) 5 /LPF (Result(s) reported on 27 Feb 2015 at 02:31AM.)  Routine Hem:  04-Mar-16 00:54   WBC (CBC)  23.7  RBC (CBC)  2.58  Hemoglobin (CBC)  7.3  Hematocrit (CBC)  23.3  Platelet Count (CBC) 375 (Result(s) reported on 27 Feb 2015 at 01:54AM.)  MCV 90  MCH 28.5  MCHC  31.6  RDW 13.2    08:28   WBC (CBC)  16.5  RBC (CBC)  2.76  Hemoglobin (CBC)  7.8  Hematocrit (CBC)  23.9  Platelet Count (CBC) 235  MCV 87  MCH 28.4  MCHC 32.7  RDW 13.6  Neutrophil % 90.8  Lymphocyte % 5.5  Monocyte % 3.3  Eosinophil % 0.1  Basophil % 0.3  Neutrophil #  15.0  Lymphocyte #  0.9  Monocyte # 0.5  Eosinophil # 0.0  Basophil # 0.0 (Result(s) reported on 27 Feb 2015 at 09:11AM.)    15:04   WBC (CBC)  16.2  RBC (CBC)  2.79  Hemoglobin (CBC)  7.9  Hematocrit (CBC)  24.1  Platelet Count (CBC) 239  MCV 87  MCH 28.3  MCHC 32.6  RDW 13.4  Neutrophil % 85.0  Lymphocyte % 8.5  Monocyte % 5.0  Eosinophil % 0.2  Basophil % 1.3  Neutrophil #  13.8  Lymphocyte # 1.4  Monocyte # 0.8  Eosinophil # 0.0  Basophil #  0.2 (Result(s) reported on 27 Feb 2015 at 03:36PM.)   Assessment/Plan:  Assessment/Plan:  Assessment as above.   Electronic Signatures: Loistine Simas (MD)  (Signed 04-Mar-16 16:18)  Authored: Chief Complaint, VITAL SIGNS/ANCILLARY NOTES, Brief Assessment, Lab Results, Assessment/Plan   Last Updated: 04-Mar-16 16:18 by Loistine Simas (MD)

## 2015-04-26 NOTE — Consult Note (Signed)
PATIENT NAME:  Adrienne Strickland, TRAUGHBER MR#:  [id number] DATE OF BIRTH:  [birth year]  DATE OF CONSULTATION:  02/28/2015  CONSULTING PHYSICIAN:  Adah Salvage. Excell Seltzer, MD  CHIEF COMPLAINT: GI bleed.   HISTORY OF PRESENT ILLNESS: This is a patient with a history of a GI bleed. Dr. Marva Panda asked me to see the patient to be aware of her condition in case she could require surgical intervention. She has multiple sclerosis and is in the ICU. She has had some vomiting and some melena and a work-up has showed peptic ulcer disease with a duodenal bulb ulcer found by Dr. Marva Panda on EGD. She has been transfused as well. She has never had an episode like this before. Denies dizziness at this time.   PAST MEDICAL HISTORY: Multiple sclerosis, arthritis, neurogenic bladder.   PAST SURGICAL HISTORY: Cholecystectomy.   ALLERGIES: SULFA.   MEDICATIONS: See reconciliation.   FAMILY HISTORY: No history of cancers; however, her father had gastric ulcers at one time.   SOCIAL HISTORY: The patient is disabled. Does not smoke or drink.   REVIEW OF SYSTEMS: A complete system review was performed and negative with the exception of that mentioned in the HPI.   PHYSICAL EXAMINATION: GENERAL: A chronically ill-appearing female patient.  VITAL SIGNS: Temperature of 98.0, pulse of 95, respirations 15, blood pressure 114/35 and 100% room air saturation.  HEENT: Shows no scleral icterus.  NECK: No palpable neck nodes.  CHEST: Clear to auscultation.  CARDIAC: Regular rate and rhythm.  ABDOMEN: Soft, nondistended and nontender.  EXTREMITIES: Without edema.  INTEGUMENT: Shows no jaundice.   DIAGNOSTIC DATA: EGD results are reviewed with Dr. Marva Panda.  LABORATORY DATA: Hemoglobin and hematocrit is 7.9 and 24.4. Repeat hemoglobin is 7.7 and that is post-transfusion. White blood cell count is 14,000. Electrolytes are within normal limits although her CO2 is 19 and her calcium is 6.   ASSESSMENT AND PLAN: This is a patient with  duodenal bulb ulceration and peptic ulcer disease. She is currently being managed conservatively and is stable without signs of rebleeding. She has had a stable hemoglobin. I have recommended observation and will be available if necessary. This was discussed with Dr. Marva Panda.     ____________________________ Adah Salvage. Excell Seltzer, MD rec:TT D: 02/28/2015 17:39:56 ET T: 02/28/2015 17:45:41 ET JOB#: [id number]  cc: Adah Salvage. Excell Seltzer, MD, <Dictator> Lattie Haw MD ELECTRONICALLY SIGNED 03/01/2015 13:12

## 2015-04-26 NOTE — Consult Note (Signed)
Chief Complaint:  Subjective/Chief Complaint no evidence of recurrent bleeding for about 36 hours.  no n/v or abdominalpain.   VITAL SIGNS/ANCILLARY NOTES: **Vital Signs.:   08-Mar-16 16:00  Vital Signs Type Routine  Temperature Temperature (F) 98.3  Temperature Source oral  Pulse Pulse 75  Respirations Respirations 25  Systolic BP Systolic BP 096  Diastolic BP (mmHg) Diastolic BP (mmHg) 59  Mean BP 80  Pulse Ox % Pulse Ox % 100  Pulse Ox Activity Level  At rest  Oxygen Delivery Room Air/ 21 %   Brief Assessment:  Cardiac Regular   Respiratory clear BS   Gastrointestinal details normal Soft  Nontender  Nondistended   Lab Results: Routine BB:  08-Mar-16 00:15   ABO Group + Rh Type O Positive  Antibody Screen NEGATIVE (Result(s) reported on 03 Mar 2015 at 01:54AM.)  Routine Chem:  08-Mar-16 00:00   Glucose, Serum 83  BUN 15  Creatinine (comp) 0.63  Sodium, Serum 143  Potassium, Serum 3.6  Chloride, Serum  112  CO2, Serum 22  Calcium (Total), Serum  7.2  Anion Gap 9  Osmolality (calc) 285  eGFR (African American) >60  eGFR (Non-African American) >60 (eGFR values <50m/min/1.73 m2 may be an indication of chronic kidney disease (CKD). Calculated eGFR, using the MRDR Study equation, is useful in  patients with stable renal function. The eGFR calculation will not be reliable in acutely ill patients when serum creatinine is changing rapidly. It is not useful in patients on dialysis. The eGFR calculation may not be applicable to patients at the low and high extremes of body sizes, pregnant women, and vegetarians.)    14:38   Result Comment HGB - SMEAR SCANNED  Result(s) reported on 03 Mar 2015 at 05:03PM.  Routine Hem:  04-Mar-16 00:54   Hemoglobin (CBC)  7.3    08:28   Hemoglobin (CBC)  7.8    15:04   Hemoglobin (CBC)  7.9    20:28   Hemoglobin (CBC)  7.7  05-Mar-16 00:52   Hemoglobin (CBC)  5.8    07:15   Hemoglobin (CBC)  7.9    12:22   Hemoglobin  (CBC)  7.7 (Result(s) reported on 28 Feb 2015 at 12:40PM.)    20:49   Hemoglobin (CBC)  9.0 (Result(s) reported on 28 Feb 2015 at 09:04PM.)  06-Mar-16 05:30   Hemoglobin (CBC)  7.9    18:00   Hemoglobin (CBC)  7.5 (Result(s) reported on 01 Mar 2015 at 0[facility])  07-Mar-16 05:56   Hemoglobin (CBC)  5.9  Hemoglobin (CBC)  5.9 (Result(s) reported on 02 Mar 2015 at 07:50AM.)    16:10   Hemoglobin (CBC)  9.1  08-Mar-16 00:00   Hemoglobin (CBC)  8.6  WBC (CBC)  15.7  RBC (CBC)  2.94  Hematocrit (CBC)  26.0  Platelet Count (CBC) 203  MCV 89  MCH 29.1  MCHC 32.9  RDW 14.0  Neutrophil % 84.9  Lymphocyte % 9.4  Monocyte % 3.8  Eosinophil % 1.3  Basophil % 0.6  Neutrophil #  13.3  Lymphocyte # 1.5  Monocyte # 0.6  Eosinophil # 0.2  Basophil # 0.1 (Result(s) reported on 03 Mar 2015 at 12:31AM.)    14:38   Hemoglobin (CBC)  8.7   Assessment/Plan:  Assessment/Plan:  Assessment 1) upper gi bleeding from sever duodenal ulcer.  no bleeding over 36 hours.  still npo.  2) Multiple sclerosis   Plan 1) if no repeat bleeding through tomorrow am, will restart clears  no red or carbonation.   continue current.   Electronic Signatures: Loistine Simas (MD)  (Signed 08-Mar-16 19:42)  Authored: Chief Complaint, VITAL SIGNS/ANCILLARY NOTES, Brief Assessment, Lab Results, Assessment/Plan   Last Updated: 08-Mar-16 19:42 by Loistine Simas (MD)

## 2015-04-26 NOTE — Consult Note (Signed)
Chief Complaint:  Subjective/Chief Complaint seen for upper gi bleeding.  no nausea emesis, abdominal pain or bm since yesterday.   VITAL SIGNS/ANCILLARY NOTES: **Vital Signs.:   10-Mar-16 08:30  Vital Signs Type Q 4hr  Temperature Temperature (F) 97.5  Temperature Source oral  Pulse Pulse 82  Respirations Respirations 18  Systolic BP Systolic BP 124  Diastolic BP (mmHg) Diastolic BP (mmHg) 63  Mean BP 83  Pulse Ox % Pulse Ox % 99  Pulse Ox Activity Level  At rest  Oxygen Delivery Room Air/ 21 %   Brief Assessment:  Cardiac Regular   Respiratory clear BS   Gastrointestinal details normal Soft  Nontender  Nondistended  No masses palpable  Bowel sounds normal   Lab Results: General Ref:  09-Mar-16 05:46   Helicobacter pylori AB. IgG, IgA, IgM ========== TEST NAME ==========  ========= RESULTS =========  = REFERENCE RANGE =  HELICOBACTER P. AB PANEL  H pylori, IgM, IgG, IgA Ab H. pylori, IgM Abs              [   <9.0 units           ]           0.0-8.9                Negative          <9.0                                                Equivocal   9.0 - 11.0                                                Positive         >11.0                                                                      .                This test was developed and its performance                characteristics determined by [facility]. It has not been                cleared or approved by the Food and Drug Administration.                Results of this test are for investigational purposes                only. The result should not be used as a diagnostic                procedure without confirmation of the diagnosis by                another medically diagnostic product or procedure.               [facility]  No: [id number]           [address], [place], [place] [address]           Mila Homer, MD         [phone]   Result(s) reported on 05 Mar 2015 at 12:18PM.   Routine Chem:  08-Mar-16 00:00   BUN 15  09-Mar-16 05:46   BUN 10 (6-20 NOTE: New Reference Range  02/17/15)  Routine Hem:  10-Mar-16 04:22   Hemoglobin (CBC)  9.5 (Result(s) reported on 05 Mar 2015 at 05:49AM.)   Assessment/Plan:  Assessment/Plan:  Assessment 1) upper gi bleeding from sever duodenal ulcer.  no bleeding well over 48 hours.  will change diet to some full liquids.   Plan 1) as above.  of note, etiology is likely the nsaid use pta, h.pylori serology is negative.   Electronic Signatures: Barnetta Chapel (MD)  (Signed 10-Mar-16 18:50)  Authored: Chief Complaint, VITAL SIGNS/ANCILLARY NOTES, Brief Assessment, Lab Results, Assessment/Plan   Last Updated: 10-Mar-16 18:50 by Barnetta Chapel (MD)

## 2015-05-19 ENCOUNTER — Ambulatory Visit: Admission: RE | Admit: 2015-05-19 | Payer: Medicare HMO | Source: Ambulatory Visit | Admitting: Gastroenterology

## 2015-05-19 ENCOUNTER — Encounter: Admission: RE | Payer: Self-pay | Source: Ambulatory Visit

## 2015-05-19 SURGERY — EGD (ESOPHAGOGASTRODUODENOSCOPY)
Anesthesia: Monitor Anesthesia Care

## 2015-06-18 DIAGNOSIS — G894 Chronic pain syndrome: Secondary | ICD-10-CM | POA: Insufficient documentation

## 2015-07-03 ENCOUNTER — Other Ambulatory Visit: Payer: Self-pay | Admitting: Neurology

## 2015-07-03 DIAGNOSIS — G35 Multiple sclerosis: Secondary | ICD-10-CM

## 2015-07-14 ENCOUNTER — Ambulatory Visit
Admission: RE | Admit: 2015-07-14 | Discharge: 2015-07-14 | Disposition: A | Discharge status: Home / Self Care | Payer: Medicare HMO | Source: Ambulatory Visit | Attending: Neurology | Admitting: Neurology

## 2015-07-14 DIAGNOSIS — G35 Multiple sclerosis: Secondary | ICD-10-CM

## 2015-07-14 MED ORDER — GADOBENATE DIMEGLUMINE 529 MG/ML IV SOLN
13.0000 mL | Freq: Once | INTRAVENOUS | Status: AC | PRN
  Administered 2015-07-14: 13 mL via INTRAVENOUS

## 2015-08-08 ENCOUNTER — Ambulatory Visit
Admission: EM | Admit: 2015-08-08 | Discharge: 2015-08-08 | Disposition: A | Discharge status: Home / Self Care | Payer: Medicare HMO | Attending: Emergency Medicine | Admitting: Emergency Medicine

## 2015-08-08 ENCOUNTER — Encounter: Payer: Self-pay | Admitting: Emergency Medicine

## 2015-08-08 DIAGNOSIS — J029 Acute pharyngitis, unspecified: Secondary | ICD-10-CM | POA: Insufficient documentation

## 2015-08-08 DIAGNOSIS — N319 Neuromuscular dysfunction of bladder, unspecified: Secondary | ICD-10-CM | POA: Insufficient documentation

## 2015-08-08 DIAGNOSIS — G35 Multiple sclerosis: Secondary | ICD-10-CM | POA: Diagnosis not present

## 2015-08-08 LAB — RAPID STREP SCREEN (MED CTR MEBANE ONLY): Streptococcus, Group A Screen (Direct): NEGATIVE

## 2015-08-08 MED ORDER — HYDROCODONE-ACETAMINOPHEN 5-325 MG PO TABS: 1.0000 | ORAL_TABLET | Freq: Four times a day (QID) | ORAL | Status: DC | PRN

## 2015-08-08 MED ORDER — FLUTICASONE PROPIONATE 50 MCG/ACT NA SUSP: 2.0000 | Freq: Every day | NASAL | Status: AC

## 2015-08-08 NOTE — ED Provider Notes (Signed)
HPI  SUBJECTIVE:  Patient reports sore throat starting last night. Sx worse with swallowing.  Sx better with nothing. Has not tried anything for this - Fever  + Swollen neck glands    No Cough + rhinorrhea, PND No Myalgias No Headache No Rash     + sick contacts, family member with same sx but no known Recent Strep Exposure No Abdominal Pain No reflux sxs No Allergy sxs  No Breathing difficulty, voice changes No Drooling No Trismus No antipyretic in past 4-6 hrs Pt is not a smoker.   Past Medical History  Diagnosis Date  . MS (multiple sclerosis)   . Arthritis   . Multiple sclerosis 09/26/2013  . Abnormality of gait 09/26/2013  . Memory difficulty   . Neurogenic bladder     Past Surgical History  Procedure Laterality Date  . Gallbladder surgery      Family History  Problem Relation Age of Onset  . Seizures Mother   . Emphysema Father   . Heart disease Brother     Social History  Substance Use Topics  . Smoking status: Never Smoker   . Smokeless tobacco: Never Used  . Alcohol Use: Yes     Comment: 1-2 beers occasionally    No current facility-administered medications for this encounter.  Current outpatient prescriptions:  .  baclofen (LIORESAL) 10 MG tablet, Take 10 mg by mouth 2 (two) times daily. , Disp: , Rfl:  .  Cholecalciferol (VITAMIN D-3) 1000 UNITS CAPS, Take 1,000 capsules by mouth 2 (two) times daily. , Disp: , Rfl:  .  dalfampridine (AMPYRA) 10 MG TB12, Take 10 mg by mouth 2 (two) times daily., Disp: , Rfl:  .  Dimethyl Fumarate (TECFIDERA) 240 MG CPDR, Take 240 mg by mouth 2 (two) times daily., Disp: , Rfl:  .  donepezil (ARICEPT) 10 MG tablet, Take 10 mg by mouth at bedtime as needed., Disp: , Rfl:  .  ergocalciferol (VITAMIN D2) 50000 UNITS capsule, Take 50,000 Units by mouth once a week. On Wednesday, Disp: , Rfl:  .  fluticasone (FLONASE) 50 MCG/ACT nasal spray, Place 2 sprays into both nostrils daily., Disp: 16 g, Rfl: 0 .   HYDROcodone-acetaminophen (NORCO/VICODIN) 5-325 MG per tablet, Take 1 tablet by mouth every 6 (six) hours as needed for moderate pain., Disp: 12 tablet, Rfl: 0 .  memantine (NAMENDA) 10 MG tablet, Take 10 mg by mouth 2 (two) times daily., Disp: , Rfl:  .  mirabegron ER (MYRBETRIQ) 25 MG TB24 tablet, Take 25 mg by mouth daily., Disp: , Rfl:  .  oxycodone (OXY-IR) 5 MG capsule, Take 5 mg by mouth every 6 (six) hours as needed for pain., Disp: , Rfl:  .  pantoprazole (PROTONIX) 40 MG tablet, Take 40 mg by mouth daily., Disp: , Rfl:  .  vitamin B-12 (CYANOCOBALAMIN) 1000 MCG tablet, Take 1,000 mcg by mouth daily., Disp: , Rfl:   Allergies  Allergen Reactions  . Sulfa Antibiotics Other (See Comments)  . Tetanus Toxoids      ROS  As noted in HPI.   Physical Exam  BP 123/54 mmHg  Pulse 104  Temp(Src) 98 F (36.7 C) (Tympanic)  Resp 16  Ht  (1.651 m)  Wt 150 lb (68.04 kg)  BMI 24.96 kg/m2  SpO2 100%  Constitutional: Well developed, well nourished, no acute distress Eyes:  EOMI, conjunctiva normal bilaterally HENT: Normocephalic, atraumatic,mucus membranes moist. TM's normal. +  nasal congestion - sinus tenderness +  erythematous oropharynx -  enlarged tonsils - exudates. Uvula midline.  Respiratory: Normal inspiratory effort Cardiovascular: Normal rate regular rhythm no murmurs rubs gallops GI: nondistended no splenomegaly skin: No rash, skin intact Lymph: + Shotty cervical LN  Musculoskeletal: no deformities Neurologic: Alert & oriented x 3, no focal neuro deficits Psychiatric: Speech and behavior appropriate  ED Course   Medications - No data to display  Orders Placed This Encounter  Procedures  . Rapid strep screen    Standing Status: Standing     Number of Occurrences: 1     Standing Expiration Date:     Order Specific Question:  Patient immune status    Answer:  Normal  . Culture, group A strep ([facility] only)    Standing Status: Standing     Number of  Occurrences: 1     Standing Expiration Date:     Results for orders placed or performed during the hospital encounter of 08/08/15 (from the past 24 hour(s))  Rapid strep screen     Status: None   Collection Time: 08/08/15  3:26 PM  Result Value Ref Range   Streptococcus, Group A Screen (Direct) NEGATIVE NEGATIVE   No results found.  ED Clinical Impression  Pharyngitis  ED Assessment/Plan   Rapid strep negative. Obtaining throat culture to guide antibiotic results. Discussed this with patient. We'll contact them if culture is positive, and will call in Appropriate antibiotics. Patient home with Tylenol, Norco for severe pain. Patient unable to take NSAIDs due to GI ulcers. Patient to followup with PMD when necessary,  Discussed labs, MDM, plan and followup with patient. Discussed sn/sx that should prompt return to the UC or ED. Patient  agrees with plan.   *This clinic note was created using Dragon dictation software. Therefore, there may be occasional mistakes despite careful proofreading.     Domenick Gong, MD 08/08/15 1714

## 2015-08-08 NOTE — ED Notes (Signed)
Sore throat, congested for 1 day

## 2015-08-08 NOTE — Discharge Instructions (Signed)
your rapid strep was negative today, so we have sent off a throat culture.  We will contact you and call in the appropriate antibiotics if your culture comes back positive for an infection requiring antibiotic treatment.  Give [place] a working phone number.  If you were given a prescription for antibiotics, you may want to wait and fill it until you know the results of the culture.  Tylenol and ibuprofen together as needed for pain.  Make sure you drink plenty of extra fluids.  Some people find salt water gargles and  Traditional Medicinal's "Throat Coat" tea helpful. Take 5 mL of liquid Benadryl and 5 mL of Maalox. Mix it together, and then hold it in your mouth for as long as you can and then swallow. You may do this 4 times a day.    You may take [phone] mg of Tylenol as needed for pain. Norco for severe pain only. Do not exceed 4 g of Tylenol from all sources in one day. Too much Tylenol can hurt your liver.  Go to www.goodrx.com to look up your medications. This will give you a list of where you can find your prescriptions at the most affordable prices.

## 2015-08-11 LAB — CULTURE, GROUP A STREP (THRC)

## 2015-09-24 ENCOUNTER — Encounter: Payer: Self-pay | Admitting: *Deleted

## 2015-10-01 ENCOUNTER — Ambulatory Visit (INDEPENDENT_AMBULATORY_CARE_PROVIDER_SITE_OTHER): Payer: Medicare HMO | Admitting: Urology

## 2015-10-01 ENCOUNTER — Encounter: Payer: Self-pay | Admitting: Urology

## 2015-10-01 VITALS — BP 121/67 | HR 92 | Ht 65.0 in | Wt 146.6 lb

## 2015-10-01 DIAGNOSIS — N319 Neuromuscular dysfunction of bladder, unspecified: Secondary | ICD-10-CM

## 2015-10-01 DIAGNOSIS — R350 Frequency of micturition: Secondary | ICD-10-CM | POA: Diagnosis not present

## 2015-10-01 LAB — BLADDER SCAN AMB NON-IMAGING: Scan Result: 20

## 2015-10-01 MED ORDER — MIRABEGRON ER 25 MG PO TB24: 25.0000 mg | ORAL_TABLET | Freq: Two times a day (BID) | ORAL | Status: DC

## 2015-10-01 NOTE — Progress Notes (Signed)
10/01/2015 9:40 AM   Adrienne Strickland 1961-06-01 [id number]  Referring provider: Duanne Limerick, MD [address] [place], [place] [address]  Chief Complaint  Patient presents with  . Urinary Frequency    one year recheck    HPI: Patient is a 54 year old white female with multiple sclerosis who has alternating urinary retention and urinary urgency which is managed with CIC and Myrbetriq 25 mg daily who presents today for her yearly follow-up.  She is a difficult historian due to her sporadic thought and difficulty expressing emotions.  She states she is cathing more than 6 catheter she is given a day. She would not admit to how often she is cathing.  She is finding her daytime frequency is well managed with the Kirkpatrick 25 mg. She is has reduced her use of depends by half. She is still struggling with nighttime incontinence and was wondering if she could take her Bactrim twice daily.  She is not having dysuria, suprapubic pain or gross hematuria. She also has not had any recent fevers, chills, nausea or vomiting.  PMH: Past Medical History  Diagnosis Date  . Urinary frequency   . Arthritis   . Multiple sclerosis (HCC) 09/26/2013  . Abnormality of gait 09/26/2013  . Memory difficulty   . Neurogenic bladder     self caths  . HTN (hypertension)     Surgical History: Past Surgical History  Procedure Laterality Date  . Gallbladder surgery      Home Medications:    Medication List       This list is accurate as of: 10/01/15  9:40 AM.  Always use your most recent med list.               AMPYRA 10 MG Tb12  Generic drug:  dalfampridine  Take 10 mg by mouth 2 (two) times daily.     baclofen 10 MG tablet  Commonly known as:  LIORESAL  Take 10 mg by mouth 2 (two) times daily.     donepezil 10 MG tablet  Commonly known as:  ARICEPT  Take 10 mg by mouth at bedtime as needed.     ergocalciferol 50000 UNITS capsule  Commonly known as:  VITAMIN D2  Take 50,000  Units by mouth once a week. On Wednesday     fluticasone 50 MCG/ACT nasal spray  Commonly known as:  FLONASE  Place 2 sprays into both nostrils daily.     HYDROcodone-acetaminophen 5-325 MG tablet  Commonly known as:  NORCO/VICODIN  Take 1 tablet by mouth every 6 (six) hours as needed for moderate pain.     memantine 10 MG tablet  Commonly known as:  NAMENDA  Take 10 mg by mouth 2 (two) times daily.     mirabegron ER 25 MG Tb24 tablet  Commonly known as:  MYRBETRIQ  Take 1 tablet (25 mg total) by mouth 2 (two) times daily.     NORTREL 1/35 (28) tablet  Generic drug:  norethindrone-ethinyl estradiol 1/35  Take by mouth.     oxycodone 5 MG capsule  Commonly known as:  OXY-IR  Take 5 mg by mouth every 6 (six) hours as needed for pain.     pantoprazole 40 MG tablet  Commonly known as:  PROTONIX  Take 40 mg by mouth daily.     TECFIDERA 240 MG Cpdr  Generic drug:  Dimethyl Fumarate  Take 240 mg by mouth 2 (two) times daily.     vitamin B-12 1000  MCG tablet  Commonly known as:  CYANOCOBALAMIN  Take 1,000 mcg by mouth daily.     Vitamin D-3 1000 UNITS Caps  Take 1,000 capsules by mouth 2 (two) times daily.        Allergies:  Allergies  Allergen Reactions  . Sulfa Antibiotics Other (See Comments)  . Tetanus Toxoids     Family History: Family History  Problem Relation Age of Onset  . Seizures Mother   . Emphysema Father   . Strickland disease Brother   . Kidney disease Neg Hx   . Prostate cancer Neg Hx     Social History:  reports that she has never smoked. She has never used smokeless tobacco. She reports that she drinks alcohol. She reports that she does not use illicit drugs.  ROS: UROLOGY Frequent Urination?: Yes Hard to postpone urination?: Yes Burning/pain with urination?: No Get up at night to urinate?: Yes Leakage of urine?: Yes Urine stream starts and stops?: No Trouble starting stream?: No Do you have to strain to urinate?: No Blood in urine?:  No Urinary tract infection?: No Sexually transmitted disease?: No Injury to kidneys or bladder?: No Painful intercourse?: No Weak stream?: No Currently pregnant?: No Vaginal bleeding?: No Last menstrual period?: n  Gastrointestinal Nausea?: No Vomiting?: No Indigestion/heartburn?: No Diarrhea?: No Constipation?: No  Constitutional Fever: No Night sweats?: No Weight loss?: No Fatigue?: No  Skin Skin rash/lesions?: No Itching?: No  Eyes Blurred vision?: No Double vision?: No  Ears/Nose/Throat Sore throat?: No Sinus problems?: No  Hematologic/Lymphatic Swollen glands?: No Easy bruising?: No  Cardiovascular Leg swelling?: Yes Chest pain?: No  Respiratory Cough?: No Shortness of breath?: No  Endocrine Excessive thirst?: No  Musculoskeletal Back pain?: No Joint pain?: No  Neurological Headaches?: No Dizziness?: No  Psychologic Depression?: No Anxiety?: No  Physical Exam: BP 121/67 mmHg  Pulse 92  Ht 5\' 5"  (1.651 m)  Wt 146 lb 9.6 oz (66.497 kg)  BMI 24.40 kg/m2   Laboratory Data:  Pertinent Imaging: Results for Adrienne, Strickland (MRN [id number]) as of 10/01/2015 09:34  Ref. Range 10/01/2015 09:17  Scan Result Unknown 20    Assessment & Plan:    1. Urinary frequency:   Patient's daytime frequency is well-managed with Myrbetriq 25 mg. She would like to take another 25 mg before she goes to bed at night. I find this reasonable. I prescribed her Myrbetriq 25 mg twice daily.   She does manage her neurogenic bladder with CIC, so if she should go into retention due to the increased dose of her Myrbetriq she can relieve herself.  2. Neurogenic bladder:   Patient has multiple sclerosis. She is also cathing herself upwards of 6 times daily.  We have discussed trying to hold off cathing herself at the first urge and allowing the urge to become stronger.  She finds that too uncomfortable.  She is not having recurrent UTI's.  Perhaps with the increase of the  Myrbetriq 25 mg to twice daily, it will reduce the urge even more and she will have to cath herself less.  She will return to office in one year for PVR and symptom recheck.      Return in about 1 year (around 09/30/2016) for IPSS.  Michiel Cowboy, PA-C  [facility] [address] [place], [place] [address] [phone]

## 2015-10-09 ENCOUNTER — Telehealth: Payer: Self-pay | Admitting: Radiology

## 2015-10-09 NOTE — Telephone Encounter (Signed)
Pt recently increased dosage of Myrbetriq but pharmacy says it needs a prior auth for the increased dose. Please call [phone].

## 2016-01-14 DIAGNOSIS — M79661 Pain in right lower leg: Secondary | ICD-10-CM | POA: Insufficient documentation

## 2016-01-14 DIAGNOSIS — M79662 Pain in left lower leg: Secondary | ICD-10-CM | POA: Insufficient documentation

## 2016-01-19 ENCOUNTER — Telehealth: Payer: Self-pay | Admitting: Urology

## 2016-01-19 DIAGNOSIS — N3281 Overactive bladder: Secondary | ICD-10-CM

## 2016-01-19 NOTE — Telephone Encounter (Signed)
She is taking the 25 mg twice daily.  If we need a PA, she can have samples until we get the approval.

## 2016-01-19 NOTE — Telephone Encounter (Signed)
Pt called stating that Myrbetriq was set up to be prescribed as 2 tablets every day but the pharmacist is not aware of this.  The pharmacist told her that they need prior authorization to fill it for 2 tablets a day.  They can fill it as one 50 mg tablet if that's ok without prior authorization.  Pt wants to know which way she should take it.  Please call to advise.

## 2016-01-21 MED ORDER — MIRABEGRON ER 50 MG PO TB24: 50.0000 mg | ORAL_TABLET | Freq: Every day | ORAL | Status: DC

## 2016-01-21 NOTE — Telephone Encounter (Signed)
Spoke with pt in reference to myrbetriq twice a day. Pt stated that her insurance will not cover 2-25mg  tabs a day but they will cover 1-50mg  tab daily. Pt was ok with the 50mg  option. Please advise.

## 2016-01-21 NOTE — Telephone Encounter (Signed)
Okay to call in the Myrbetriq 50 mg daily.

## 2016-01-21 NOTE — Telephone Encounter (Signed)
Sent medication to pharmacy.   

## 2016-02-01 ENCOUNTER — Telehealth: Payer: Self-pay

## 2016-02-01 NOTE — Telephone Encounter (Signed)
LMOM- myrbetriq  does not require a PA and the medication is available at the amount listed on insurance plan.

## 2016-03-22 DIAGNOSIS — J301 Allergic rhinitis due to pollen: Secondary | ICD-10-CM | POA: Insufficient documentation

## 2016-03-28 ENCOUNTER — Telehealth: Payer: Self-pay | Admitting: Urology

## 2016-03-28 DIAGNOSIS — R350 Frequency of micturition: Secondary | ICD-10-CM

## 2016-03-28 NOTE — Telephone Encounter (Signed)
Pt called and had changed dosages about 6 months ago and would like to go back to lower dosage of Myrbetriq.  [phone]

## 2016-03-29 MED ORDER — MIRABEGRON ER 25 MG PO TB24: 25.0000 mg | ORAL_TABLET | Freq: Two times a day (BID) | ORAL | Status: DC

## 2016-03-29 NOTE — Telephone Encounter (Signed)
That would be fine.  Myrbetriq 25 mg daily.

## 2016-03-29 NOTE — Telephone Encounter (Signed)
Please advise 

## 2016-03-29 NOTE — Telephone Encounter (Signed)
Spoke with pt and made aware myrbetriq has been switched to 25mg . A new rx was sent to pharmacy. Pt voiced understanding.

## 2016-04-12 ENCOUNTER — Other Ambulatory Visit: Payer: Self-pay | Admitting: Neurology

## 2016-04-12 DIAGNOSIS — G35 Multiple sclerosis: Secondary | ICD-10-CM

## 2016-04-13 ENCOUNTER — Telehealth: Payer: Self-pay | Admitting: Urology

## 2016-04-13 NOTE — Telephone Encounter (Signed)
Spoke with [facility] who stated pt PA for myrbetriq 25mg  has been denied. [employer] stated the 50mg  tablet is approved tho. LMOM

## 2016-04-13 NOTE — Telephone Encounter (Signed)
Pt has been made aware of this. Thank you.

## 2016-04-13 NOTE — Telephone Encounter (Signed)
Ms. Gonser called saying the last time her Rx of Myrbetriq was filled, she didn't let [facility] pharmacy know she should be taking one 25mg  pill twice a day instead of continuing the 50mg  pill once a day. Due to their not having the Rx, they told her she needs an authorization before they refill it. The phone number to [facility] that she gave was [phone]. She'd like a phone call regarding this if needed.   Pt's ph# [phone] Thank you.

## 2016-04-29 DIAGNOSIS — K5900 Constipation, unspecified: Secondary | ICD-10-CM | POA: Insufficient documentation

## 2016-06-16 ENCOUNTER — Telehealth: Payer: Self-pay | Admitting: Urology

## 2016-06-16 NOTE — Telephone Encounter (Signed)
Patient received a call from [facility] pharmacy.  They are requiring that the instructions for Myrbetriq read "25mg  tablet once daily" to approve (instead of twice daily).  If there are any problems, please contact the patient.

## 2016-07-04 ENCOUNTER — Telehealth: Payer: Self-pay | Admitting: Urology

## 2016-07-04 NOTE — Telephone Encounter (Signed)
Pt called and would like 1 week supply of Myrbetriq 25 mg, but she takes 2 per day, so a total of 14, to get her through, til she gets her prescription.  She wants to know if she needs to cut back to 1 per day.  Please call pt [phone]

## 2016-07-06 NOTE — Telephone Encounter (Signed)
LMOM for patient to call us back, we are out of  samples at this time and would like to talk to her about the dosage.

## 2016-07-06 NOTE — Telephone Encounter (Signed)
Spoke with patient and let patient know that we do not have any samples of the 25 mg Myrbetriq. The 50mg  was approved and sent to [facility]. I will give patient two weeks of Myrbetriq 50mg  and she is only to take 1 pill per day. Patient understands. Samples up front for pick up.

## 2016-08-03 ENCOUNTER — Telehealth: Payer: Self-pay

## 2016-08-03 NOTE — Telephone Encounter (Signed)
PA for myrbetriq 25mg  was APPROVED.

## 2016-09-30 ENCOUNTER — Ambulatory Visit (INDEPENDENT_AMBULATORY_CARE_PROVIDER_SITE_OTHER): Payer: Medicare HMO | Admitting: Urology

## 2016-09-30 ENCOUNTER — Encounter: Payer: Self-pay | Admitting: Urology

## 2016-09-30 ENCOUNTER — Ambulatory Visit: Payer: Medicare HMO | Admitting: Urology

## 2016-09-30 VITALS — BP 134/61 | HR 74 | Ht 65.0 in | Wt 158.0 lb

## 2016-09-30 DIAGNOSIS — R351 Nocturia: Secondary | ICD-10-CM | POA: Diagnosis not present

## 2016-09-30 DIAGNOSIS — R350 Frequency of micturition: Secondary | ICD-10-CM

## 2016-09-30 DIAGNOSIS — N319 Neuromuscular dysfunction of bladder, unspecified: Secondary | ICD-10-CM | POA: Diagnosis not present

## 2016-09-30 LAB — BLADDER SCAN AMB NON-IMAGING: Scan Result: 43

## 2016-09-30 MED ORDER — MIRABEGRON ER 25 MG PO TB24: 25.0000 mg | ORAL_TABLET | Freq: Two times a day (BID) | ORAL | 4 refills | Status: DC

## 2016-09-30 NOTE — Progress Notes (Addendum)
09/30/2016 9:58 AM   Adrienne Strickland 1961/07/14 [id number]  Referring provider: Griffith Citron, MD No address on file  Chief Complaint  Patient presents with  . Follow-up    1 year urinary frequency and neurogenic bladder    HPI: Patient is a 55 year old Caucasian female with multiple sclerosis who has alternating urinary retention and urinary urgency which is managed with CIC and Myrbetriq 25 mg bid who presents today for her yearly follow-up.  Patient states that she is cathing herself off to 6 times daily.  She also wears depends continually. She is bothered a great deal by her accidental loss of small amounts of urine.  She is bothered quite a bit by having to get up every 3 hours to urinate at night.    She is no longer finding her daytime frequency is well managed with the Myrbetriq 25 mg twice daily.      She sets a night time alarm to get up every 3 hours to use the restroom in an effort to reduce night time leakage.    She is not having dysuria, suprapubic pain or gross hematuria. She also has not had any recent fevers, chills, nausea or vomiting.  She also has been recently diagnosed with memory issues and is currently on medications for this condition.   Patient states that she has had urinary incontinence for several years.  She admits to SUI and UI.      She does not have a history of nephrolithiasis, GU surgery or GU trauma.   She is not sexually active.    She is post menopausal.   She admits to constipation and diarrhea.   She had a recent CT with contrast in May 2017 and no worrisome GU findings were noted, no hydronephrosis, no stones or no masses.    She is drinking a lot of water daily.   She is drinking some caffeinated beverages daily.  She is drinking alcoholic beverages on occasion.    Her risk factors for incontinence are age, caffeine, depression, oral estrogens and memory issues.    She is taking opioids, OAB medication, antidepressants,  antipsychotics, skeletal muscle relaxants and/or oral estrogen.    Her PVR today was 43 mL.    PMH: Past Medical History:  Diagnosis Date  . Abnormality of gait 09/26/2013  . Arthritis   . HTN (hypertension)   . Memory difficulty   . Multiple sclerosis (HCC) 09/26/2013  . Multiple sclerosis (HCC)   . Neurogenic bladder    self caths  . Urinary frequency     Surgical History: Past Surgical History:  Procedure Laterality Date  . GALLBLADDER SURGERY      Home Medications:    Medication List       Accurate as of 09/30/16  9:58 AM. Always use your most recent med list.          AMPYRA 10 MG Tb12 Generic drug:  dalfampridine Take 10 mg by mouth 2 (two) times daily.   baclofen 10 MG tablet Commonly known as:  LIORESAL Take 10 mg by mouth 2 (two) times daily.   donepezil 10 MG tablet Commonly known as:  ARICEPT Take 10 mg by mouth at bedtime as needed.   ergocalciferol 50000 units capsule Commonly known as:  VITAMIN D2 Take 50,000 Units by mouth once a week. On Wednesday   fluticasone 50 MCG/ACT nasal spray Commonly known as:  FLONASE Place 2 sprays into both nostrils daily.   fluticasone 50 MCG/BLIST  diskus inhaler Commonly known as:  FLOVENT DISKUS Inhale into the lungs.   HYDROcodone-acetaminophen 5-325 MG tablet Commonly known as:  NORCO/VICODIN Take 1 tablet by mouth every 6 (six) hours as needed for moderate pain.   memantine 10 MG tablet Commonly known as:  NAMENDA Take 10 mg by mouth 2 (two) times daily.   mirabegron ER 25 MG Tb24 tablet Commonly known as:  MYRBETRIQ Take 1 tablet (25 mg total) by mouth 2 (two) times daily.   NORTREL 1/35 (28) tablet Generic drug:  norethindrone-ethinyl estradiol 1/35 Take by mouth.   oxycodone 5 MG capsule Commonly known as:  OXY-IR Take 5 mg by mouth every 6 (six) hours as needed for pain.   pantoprazole 40 MG tablet Commonly known as:  PROTONIX Take 40 mg by mouth daily.   polyethylene glycol powder  powder Commonly known as:  GLYCOLAX/MIRALAX Take 17g twice daily for constipation.   senna 8.6 MG tablet Commonly known as:  SENOKOT Take by mouth.   senna-docusate 8.6-50 MG tablet Commonly known as:  Senokot-S Take by mouth.   TECFIDERA 240 MG Cpdr Generic drug:  Dimethyl Fumarate Take 240 mg by mouth 2 (two) times daily.   vitamin B-12 1000 MCG tablet Commonly known as:  CYANOCOBALAMIN Take 1,000 mcg by mouth daily.   Vitamin D-3 1000 units Caps Take 1,000 capsules by mouth 2 (two) times daily.       Allergies:  Allergies  Allergen Reactions  . Sulfa Antibiotics Other (See Comments)  . Tetanus Toxoids     Family History: Family History  Problem Relation Age of Onset  . Seizures Mother   . Emphysema Father   . Heart disease Brother   . Kidney disease Neg Hx   . Prostate cancer Neg Hx   . Bladder Cancer Neg Hx     Social History:  reports that she has never smoked. She has never used smokeless tobacco. She reports that she drinks alcohol. She reports that she does not use drugs.  ROS: UROLOGY Frequent Urination?: No Hard to postpone urination?: No Burning/pain with urination?: No Get up at night to urinate?: No Leakage of urine?: No Urine stream starts and stops?: No Trouble starting stream?: No Do you have to strain to urinate?: No Blood in urine?: No Urinary tract infection?: No Sexually transmitted disease?: No Injury to kidneys or bladder?: No Painful intercourse?: No Weak stream?: No Currently pregnant?: No Vaginal bleeding?: No Last menstrual period?: n  Gastrointestinal Nausea?: No Vomiting?: No Indigestion/heartburn?: No Diarrhea?: Yes Constipation?: Yes  Constitutional Fever: No Night sweats?: No Weight loss?: No Fatigue?: No  Skin Skin rash/lesions?: No Itching?: No  Eyes Blurred vision?: No Double vision?: No  Ears/Nose/Throat Sore throat?: No Sinus problems?: No  Hematologic/Lymphatic Swollen glands?: No Easy  bruising?: No  Cardiovascular Leg swelling?: Yes Chest pain?: No  Respiratory Cough?: No Shortness of breath?: No  Endocrine Excessive thirst?: No  Musculoskeletal Back pain?: No Joint pain?: No  Neurological Headaches?: No Dizziness?: No  Psychologic Depression?: No Anxiety?: No  Physical Exam: BP 134/61   Pulse 74   Ht 5\' 5"  (1.651 m)   Wt 158 lb (71.7 kg)   BMI 26.29 kg/m   Constitutional: Well nourished. Alert and oriented, No acute distress. HEENT: [place] AT, moist mucus membranes. Trachea midline, no masses. Cardiovascular: No clubbing, cyanosis, or edema. Respiratory: Normal respiratory effort, no increased work of breathing. GI: Abdomen is soft, non tender, non distended, no abdominal masses. Liver and spleen not palpable.  No hernias appreciated.  Stool sample for occult testing is not indicated.   GU: No CVA tenderness.  No bladder fullness or masses.  Normal external genitalia, normal pubic hair distribution, no lesions.  Normal urethral meatus, no lesions, no prolapse, no discharge.   No urethral masses, tenderness and/or tenderness. No bladder fullness, tenderness or masses. Normal vagina mucosa, good estrogen effect, no discharge, no lesions, good pelvic support, no cystocele or rectocele noted.  No cervical motion tenderness.  Uterus is freely mobile and non-fixed.  No adnexal/parametria masses or tenderness noted.  Anus and perineum are without rashes or lesions.    Skin: No rashes, bruises or suspicious lesions. Lymph: No cervical or inguinal adenopathy. Neurologic: Grossly intact, no focal deficits, moving all 4 extremities. Psychiatric: Normal mood and affect.   Pertinent Imaging: EXAM: CT abdomen and pelvis with contrast DATE: 05/01/2016 10:36 AM ACCESSION: [id number] UN DICTATED: 05/01/2016 10:36 AM INTERPRETATION LOCATION: [facility]  CLINICAL INDICATION: 55 years old Female with ABDOMINAL PAIN, (specify site in comments)-[id number]  COMPARISON:  None  TECHNIQUE: A spiral CT scan was obtained with IV contrast from the lung bases to the pubic symphysis.Images were reconstructed in the axial plane. Coronal and sagittal reformatted images were also provided for further evaluation.  FINDINGS:  Lung bases are clear.  Hepatic fatty infiltration around the falciform ligament. Hepatomegaly measuring 18.6 cm craniocaudally mild biliary dilatation, likely related to cholecystectomy. Common bile duct tapers to sleep. Pancreas is unremarkable. The spleen, adrenal glands, and bilateral kidneys are unremarkable. Bladder is distended and unremarkable. Lobulated appearance of the uterus, which is anteflexed, which may represent fibroids. The left ovary is unremarkable. The right ovary is not well visualized. No free fluid in the pelvis.  The large and small bowel are normal caliber without evidence for obstruction. There are scattered colonic diverticuli without definitive evidence for acute diverticulitis. Unremarkable appendix. No ascites.  No abdominopelvic adenopathy.  Normal caliber abdominal aorta. The portal vein, SMV, and splenic vein are patent.  No acute osseous abnormality identified. There is moderate to severe lumbar degenerative disc disease greatest at L3-L4 and L1-L2.   IMPRESSION: -Colonic diverticulosis without definitive evidence of acute diverticulitis. -Lobulated uterus, indeterminate. This may represent fibroids however consider further evaluation with transvaginal ultrasound if clinically indicated. -Hepatomegaly.  Status Results Details   Results for CLEASTER, SHIFFER (MRN [id number]) as of 10/09/2016 15:59  Ref. Range 09/30/2016 09:27  Scan Result Unknown 43     Assessment & Plan:    1. Urinary frequency  - refills given for Myrbetriq 25 mg twice daily  - offered referral to PT- patient accepts and referral is made  - RTC in one year for PVR, OAB questionnaire and exam   2. Neurogenic bladder:   Patient has  multiple sclerosis. She is also cathing herself upwards of 6 times daily.  We have discussed trying to hold off cathing herself at the first urge and allowing the urge to become stronger.  She finds that too uncomfortable.  She is not having recurrent UTI's.  We did increase of the Myrbetriq 25 mg to twice daily and it provided relief initially.  She is being referred to PT.   3. Nocturia  - I explained to the patient that nocturia is often multi-factorial and difficult to treat.  Sleeping disorders, heart conditions, peripheral vascular disease, diabetes, an enlarged prostate for men, an urethral stricture causing bladder outlet obstruction and/or certain medications can contribute to nocturia.  - I have suggested that the patient avoid  caffeine after noon and alcohol in the evening.  He or she may also benefit from fluid restrictions after 6:00 in the evening and voiding just prior to bedtime.  - I have explained that research studies have showed that over 84% of patients with sleep apnea reported frequent nighttime urination.   With sleep apnea, oxygen decreases, carbon dioxide increases, the blood become more acidic, the heart rate drops and blood vessels in the lung constrict.  The body is then alerted that something is very wrong. The sleeper must wake enough to reopen the airway. By this time, the heart is racing and experiences a false signal of fluid overload. The heart excretes a hormone-like protein that tells the body to get rid of sodium and water, resulting in nocturia.  - I advised her not to set her alarm at night to urinate  - The patient may benefit from a discussion with his or her primary care physician to see if he or she has risk factors for sleep apnea or other sleep disturbances and obtaining a sleep study.    Return in about 1 year (around 09/30/2017), or PVR, OAB quest. and exam.  Michiel Cowboy, [facility]  [facility] [address] [place], [place] [address] [phone]

## 2017-03-31 ENCOUNTER — Other Ambulatory Visit: Payer: Self-pay | Admitting: Urology

## 2017-03-31 DIAGNOSIS — R350 Frequency of micturition: Secondary | ICD-10-CM

## 2017-09-27 NOTE — Progress Notes (Deleted)
09/29/2017 10:55 AM   Adrienne Strickland 1961/08/06 [id number]  Referring provider: Griffith Citron, MD No address on file  No chief complaint on file.   HPI: 56 yo WF with MS who has alternating retention and urgency which is managed with CIC and Myrbetriq 25 mg bid who presents today for yearly follow-up.  Background history Patient is a 56 year old Caucasian female with multiple sclerosis who has alternating urinary retention and urinary urgency which is managed with CIC and Myrbetriq 25 mg bid who presents today for her yearly follow-up.  Patient states that she is cathing herself off to 6 times daily.  She also wears depends continually. She is bothered a great deal by her accidental loss of small amounts of urine.  She is bothered quite a bit by having to get up every 3 hours to urinate at night.  She is no longer finding her daytime frequency is well managed with the Myrbetriq 25 mg twice daily.   She sets a night time alarm to get up every 3 hours to use the restroom in an effort to reduce night time leakage.  She is not having dysuria, suprapubic pain or gross hematuria. She also has not had any recent fevers, chills, nausea or vomiting.  She also has been recently diagnosed with memory issues and is currently on medications for this condition.   Patient states that she has had urinary incontinence for several years.  She admits to SUI and UI.   She does not have a history of nephrolithiasis, GU surgery or GU trauma.   She is not sexually active.    She is post menopausal.   She admits to constipation and diarrhea.   She had a recent CT with contrast in May 2017 and no worrisome GU findings were noted, no hydronephrosis, no stones or no masses.   She is drinking a lot of water daily.   She is drinking some caffeinated beverages daily.  She is drinking alcoholic beverages on occasion.   Her risk factors for incontinence are age, caffeine, depression, oral estrogens and memory issues.   She is  taking opioids, OAB medication, antidepressants, antipsychotics, skeletal muscle relaxants and/or oral estrogen.   Her PVR was 43 mL.  Patient has recently seen her neurologist and admitted to him that she is having a glass of wine or shot of vodka every other night.   She also admitted to drinking too much one night and losing her balance.  A contrast CT performed in 12/2016 noted no abnormalities within the kidneys or the bladder.  Today, patient is experiencing urgency x *** (***), frequency x *** (***), not/is restricting fluids to avoid visits to the restroom ***, not/is engaging in toilet mapping, incontinence x *** (***) and nocturia x *** (***).   Her PVR is ***.   Her BP is ***.   She's not had any recent dysuria, gross hematuria or suprapubic pain. She is on any recent fevers, chills, nausea or vomiting.  PMH: Past Medical History:  Diagnosis Date  . Abnormality of gait 09/26/2013  . Arthritis   . HTN (hypertension)   . Memory difficulty   . Multiple sclerosis (HCC) 09/26/2013  . Multiple sclerosis (HCC)   . Neurogenic bladder    self caths  . Urinary frequency     Surgical History: Past Surgical History:  Procedure Laterality Date  . GALLBLADDER SURGERY      Home Medications:  Allergies as of 09/29/2017  Reactions   Sulfa Antibiotics Other (See Comments)   Tetanus Toxoids       Medication List       Accurate as of 09/27/17 10:55 AM. Always use your most recent med list.          AMPYRA 10 MG Tb12 Generic drug:  dalfampridine Take 10 mg by mouth 2 (two) times daily.   baclofen 10 MG tablet Commonly known as:  LIORESAL Take 10 mg by mouth 2 (two) times daily.   donepezil 10 MG tablet Commonly known as:  ARICEPT Take 10 mg by mouth at bedtime as needed.   ergocalciferol 50000 units capsule Commonly known as:  VITAMIN D2 Take 50,000 Units by mouth once a week. On Wednesday   fluticasone 50 MCG/ACT nasal spray Commonly known as:  FLONASE Place 2  sprays into both nostrils daily.   fluticasone 50 MCG/BLIST diskus inhaler Commonly known as:  FLOVENT DISKUS Inhale into the lungs.   HYDROcodone-acetaminophen 5-325 MG tablet Commonly known as:  NORCO/VICODIN Take 1 tablet by mouth every 6 (six) hours as needed for moderate pain.   memantine 10 MG tablet Commonly known as:  NAMENDA Take 10 mg by mouth 2 (two) times daily.   MYRBETRIQ 25 MG Tb24 tablet Generic drug:  mirabegron ER TAKE 1 TABLET (25 MG TOTAL) BY MOUTH 2  TIMES DAILY.   NORTREL 1/35 (28) tablet Generic drug:  norethindrone-ethinyl estradiol 1/35 Take by mouth.   oxycodone 5 MG capsule Commonly known as:  OXY-IR Take 5 mg by mouth every 6 (six) hours as needed for pain.   pantoprazole 40 MG tablet Commonly known as:  PROTONIX Take 40 mg by mouth daily.   polyethylene glycol powder powder Commonly known as:  GLYCOLAX/MIRALAX Take 17g twice daily for constipation.   senna 8.6 MG tablet Commonly known as:  SENOKOT Take by mouth.   TECFIDERA 240 MG Cpdr Generic drug:  Dimethyl Fumarate Take 240 mg by mouth 2 (two) times daily.   vitamin B-12 1000 MCG tablet Commonly known as:  CYANOCOBALAMIN Take 1,000 mcg by mouth daily.   Vitamin D-3 1000 units Caps Take 1,000 capsules by mouth 2 (two) times daily.       Allergies:  Allergies  Allergen Reactions  . Sulfa Antibiotics Other (See Comments)  . Tetanus Toxoids     Family History: Family History  Problem Relation Age of Onset  . Seizures Mother   . Emphysema Father   . Heart disease Brother   . Kidney disease Neg Hx   . Prostate cancer Neg Hx   . Bladder Cancer Neg Hx     Social History:  reports that she has never smoked. She has never used smokeless tobacco. She reports that she drinks alcohol. She reports that she does not use drugs.  ROS:                                        Physical Exam: There were no vitals taken for this visit.  Constitutional: Well  nourished. Alert and oriented, No acute distress. HEENT: [place] AT, moist mucus membranes. Trachea midline, no masses. Cardiovascular: No clubbing, cyanosis, or edema. Respiratory: Normal respiratory effort, no increased work of breathing. GI: Abdomen is soft, non tender, non distended, no abdominal masses. Liver and spleen not palpable.  No hernias appreciated.  Stool sample for occult testing is not indicated.   GU: No  CVA tenderness.  No bladder fullness or masses.  Normal external genitalia, normal pubic hair distribution, no lesions.  Normal urethral meatus, no lesions, no prolapse, no discharge.   No urethral masses, tenderness and/or tenderness. No bladder fullness, tenderness or masses. Normal vagina mucosa, good estrogen effect, no discharge, no lesions, good pelvic support, no cystocele or rectocele noted.  No cervical motion tenderness.  Uterus is freely mobile and non-fixed.  No adnexal/parametria masses or tenderness noted.  Anus and perineum are without rashes or lesions.    Skin: No rashes, bruises or suspicious lesions. Lymph: No cervical or inguinal adenopathy. Neurologic: Grossly intact, no focal deficits, moving all 4 extremities. Psychiatric: Normal mood and affect.   Pertinent Imaging: Result Impression  1. No CT examination of the patient's left lower quadrant pain. 2. Mild to moderate intra and extrahepatic biliary ductal dilatation status post cholecystectomy.  Result Narrative  EXAM: CT abdomen and pelvis with contrast DATE: 01/17/2017 12:35 PM ACCESSION: [id number] UN DICTATED: 01/17/2017 12:50 PM INTERPRETATION LOCATION: [facility]  CLINICAL INDICATION: 56 years old Female with ABDOMINAL PAIN, (specify site in comments)-left lower quadrantR10.32-Abdominal pain, acute, left lower quadrant  COMPARISON: 05/01/2016  TECHNIQUE: A spiral CT scan was obtained with IV contrast from the lung bases to the pubic symphysis.Images were reconstructed in the axial plane. Coronal  and sagittal reformatted images were also provided for further evaluation.  FINDINGS: Heart is normal in size. No pericardial effusion is identified. Lung bases are clear. No pleural effusion.  Liver demonstrates a normal size and contour. No focal hepatic lesions are identified. Hepatic and portal veins are patent. Gallbladder is surgically absent. Mild to moderate intra and extrahepatic biliary ductal dilatation is identified with the common bile duct measuring 1.2 cm in caliber, tapering distally at the ampulla.  Spleen is normal in size. Adrenal glands are unremarkable. Pancreas is unremarkable. Kidneys demonstrate symmetric cortical enhancement bilaterally without evidence of hydronephrosis or nephrolithiasis.  The aorta, celiac, SMA, bilateral renal arteries, and IMA are patent and of normal caliber. No free fluid or free air is identified. No pathologically enlarged adenopathy is identified.  The bowel demonstrates a normal caliber and is without evidence of bowel obstruction or focal bowel wall thickening.  Uterus is unremarkable. Ovaries are not well visualized. Bladder is decompressed.   No worrisome osseous lesions.  Other Result Information  Interface, Rad Results In - 01/17/2017  1:05 PM EST EXAM: CT abdomen and pelvis with contrast DATE: 01/17/2017 12:35 PM ACCESSION: [id number] UN DICTATED: 01/17/2017 12:50 PM INTERPRETATION LOCATION: [facility]  CLINICAL INDICATION: 56 years old Female with ABDOMINAL PAIN, (specify site in comments)-left lower quadrantR10.32-Abdominal pain, acute, left lower quadrant    COMPARISON: 05/01/2016  TECHNIQUE: A spiral CT scan was obtained with IV contrast from the lung bases to the pubic symphysis.  Images were reconstructed in the axial plane. Coronal and sagittal reformatted images were also provided for further evaluation.  FINDINGS: Heart is normal in size. No pericardial effusion is identified. Lung bases are clear. No pleural  effusion.  Liver demonstrates a normal size and contour. No focal hepatic lesions are identified. Hepatic and portal veins are patent. Gallbladder is surgically absent. Mild to moderate intra and extrahepatic biliary ductal dilatation is identified with the common bile duct measuring 1.2 cm in caliber, tapering distally at the ampulla.  Spleen is normal in size. Adrenal glands are unremarkable. Pancreas is unremarkable. Kidneys demonstrate symmetric cortical enhancement bilaterally without evidence of hydronephrosis or nephrolithiasis.  The aorta, celiac, SMA, bilateral renal arteries,  and IMA are patent and of normal caliber. No free fluid or free air is identified. No pathologically enlarged adenopathy is identified.  The bowel demonstrates a normal caliber and is without evidence of bowel obstruction or focal bowel wall thickening.  Uterus is unremarkable. Ovaries are not well visualized. Bladder is decompressed.   No worrisome osseous lesions.   IMPRESSION: 1. No CT examination of the patient's left lower quadrant pain. 2. Mild to moderate intra and extrahepatic biliary ductal dilatation status post cholecystectomy.       Assessment & Plan:    1. Urinary frequency  - refills given for Myrbetriq 25 mg twice daily  - offered referral to PT- patient accepts and referral is made  - RTC in one year for PVR, OAB questionnaire and exam   2. Neurogenic bladder:   Patient has multiple sclerosis. She is also cathing herself upwards of 6 times daily.  We have discussed trying to hold off cathing herself at the first urge and allowing the urge to become stronger.  She finds that too uncomfortable.  She is not having recurrent UTI's.  We did increase of the Myrbetriq 25 mg to twice daily and it provided relief initially.  She is being referred to PT.   3. Nocturia  - I explained to the patient that nocturia is often multi-factorial and difficult to treat.  Sleeping disorders, heart  conditions, peripheral vascular disease, diabetes, an enlarged prostate for men, an urethral stricture causing bladder outlet obstruction and/or certain medications can contribute to nocturia.  - I have suggested that the patient avoid caffeine after noon and alcohol in the evening.  He or she may also benefit from fluid restrictions after 6:00 in the evening and voiding just prior to bedtime.  - I have explained that research studies have showed that over 84% of patients with sleep apnea reported frequent nighttime urination.   With sleep apnea, oxygen decreases, carbon dioxide increases, the blood become more acidic, the heart rate drops and blood vessels in the lung constrict.  The body is then alerted that something is very wrong. The sleeper must wake enough to reopen the airway. By this time, the heart is racing and experiences a false signal of fluid overload. The heart excretes a hormone-like protein that tells the body to get rid of sodium and water, resulting in nocturia.  - I advised her not to set her alarm at night to urinate  - The patient may benefit from a discussion with his or her primary care physician to see if he or she has risk factors for sleep apnea or other sleep disturbances and obtaining a sleep study.  4. Alcohol abuse  - patient advised that alcohol is irritative to the bladder and to reduce her consumption     No Follow-up on file.  Michiel Cowboy, PA-C  [facility] [address] [place], [place] [address] [phone]

## 2017-09-29 ENCOUNTER — Ambulatory Visit: Payer: Self-pay | Admitting: Urology

## 2017-09-29 ENCOUNTER — Other Ambulatory Visit: Payer: Self-pay | Admitting: Neurology

## 2017-09-29 DIAGNOSIS — G35 Multiple sclerosis: Secondary | ICD-10-CM

## 2017-10-06 ENCOUNTER — Ambulatory Visit: Payer: Medicare HMO

## 2017-10-10 ENCOUNTER — Ambulatory Visit: Payer: Medicare HMO

## 2017-10-12 ENCOUNTER — Other Ambulatory Visit: Payer: Self-pay | Admitting: Neurology

## 2017-10-12 DIAGNOSIS — G35 Multiple sclerosis: Secondary | ICD-10-CM

## 2017-10-24 ENCOUNTER — Ambulatory Visit
Admission: RE | Admit: 2017-10-24 | Discharge: 2017-10-24 | Disposition: A | Discharge status: Home / Self Care | Payer: Medicare HMO | Source: Ambulatory Visit | Attending: Neurology | Admitting: Neurology

## 2017-10-24 ENCOUNTER — Ambulatory Visit: Payer: Medicare HMO

## 2017-10-24 DIAGNOSIS — M4802 Spinal stenosis, cervical region: Secondary | ICD-10-CM | POA: Insufficient documentation

## 2017-10-24 DIAGNOSIS — M47812 Spondylosis without myelopathy or radiculopathy, cervical region: Secondary | ICD-10-CM | POA: Insufficient documentation

## 2017-10-24 DIAGNOSIS — G35 Multiple sclerosis: Secondary | ICD-10-CM

## 2017-10-24 DIAGNOSIS — M5021 Other cervical disc displacement,  high cervical region: Secondary | ICD-10-CM | POA: Diagnosis not present

## 2017-10-24 DIAGNOSIS — G319 Degenerative disease of nervous system, unspecified: Secondary | ICD-10-CM | POA: Insufficient documentation

## 2017-10-24 MED ORDER — GADOBENATE DIMEGLUMINE 529 MG/ML IV SOLN
13.0000 mL | Freq: Once | INTRAVENOUS | Status: AC | PRN
  Administered 2017-10-24: 13 mL via INTRAVENOUS

## 2017-10-26 NOTE — Progress Notes (Signed)
10/27/2017 10:04 AM   Adrienne Strickland 1961-09-04 [id number]  Referring provider: Griffith Citron, MD No address on file  Chief Complaint  Patient presents with  . Urinary Frequency    1year    HPI: 56 yo WF with MS who has alternating retention and urgency which is managed with CIC and Myrbetriq 25 mg bid who presents today for yearly follow-up.  Background history Patient is a 56 year old Caucasian female with multiple sclerosis who has alternating urinary retention and urinary urgency which is managed with CIC and Myrbetriq 25 mg bid who presents today for her yearly follow-up.  Patient states that she is cathing herself off to 6 times daily.  She also wears depends continually. She is bothered a great deal by her accidental loss of small amounts of urine.  She is bothered quite a bit by having to get up every 3 hours to urinate at night.  She is no longer finding her daytime frequency is well managed with the Myrbetriq 25 mg twice daily.   She sets a night time alarm to get up every 3 hours to use the restroom in an effort to reduce night time leakage.  She is not having dysuria, suprapubic pain or gross hematuria. She also has not had any recent fevers, chills, nausea or vomiting.  She also has been recently diagnosed with memory issues and is currently on medications for this condition.   Patient states that she has had urinary incontinence for several years.  She admits to SUI and UI.   She does not have a history of nephrolithiasis, GU surgery or GU trauma.   She is not sexually active.    She is post menopausal.   She admits to constipation and diarrhea.   She had a recent CT with contrast in May 2017 and no worrisome GU findings were noted, no hydronephrosis, no stones or no masses.   She is drinking a lot of water daily.   She is drinking some caffeinated beverages daily.  She is drinking alcoholic beverages on occasion.   Her risk factors for incontinence are age, caffeine,  depression, oral estrogens and memory issues.   She is taking opioids, OAB medication, antidepressants, antipsychotics, skeletal muscle relaxants and/or oral estrogen.   Her PVR was 43 mL.  Patient has recently seen her neurologist and admitted to him that she is having a glass of wine or shot of vodka every other night.   She also admitted to drinking too much one night and losing her balance.  A contrast CT performed in 12/2016 noted no abnormalities within the kidneys or the bladder.  Today, patient is experiencing urgency x 0-3, frequency x 8 or more, is restricting fluids to avoid visits to the restroom, is engaging in toilet mapping, incontinence x 4-7 and nocturia x 0-3.   Her PVR is 43 mL.   Her BP is 138/69.   She's not had any recent dysuria, gross hematuria or suprapubic pain. She is on any recent fevers, chills, nausea or vomiting.  Her main complaints are frequency, urgency and intermittency.  She is CIC 6 to 8 times daily.  She is not getting large amounts of urine out when she caths.    PMH: Past Medical History:  Diagnosis Date  . Abnormality of gait 09/26/2013  . Arthritis   . HTN (hypertension)   . Memory difficulty   . Multiple sclerosis (HCC) 09/26/2013  . Multiple sclerosis (HCC)   . Neurogenic bladder  self caths  . Urinary frequency     Surgical History: Past Surgical History:  Procedure Laterality Date  . GALLBLADDER SURGERY      Home Medications:  Allergies as of 10/27/2017      Reactions   Sulfa Antibiotics Other (See Comments)   Tetanus Toxoids       Medication List       Accurate as of 10/27/17 10:04 AM. Always use your most recent med list.          AMPYRA 10 MG Tb12 Generic drug:  dalfampridine Take 10 mg by mouth 2 (two) times daily.   baclofen 10 MG tablet Commonly known as:  LIORESAL Take 10 mg by mouth 2 (two) times daily.   donepezil 10 MG tablet Commonly known as:  ARICEPT Take 10 mg by mouth at bedtime as needed.     ergocalciferol 50000 units capsule Commonly known as:  VITAMIN D2 Take 50,000 Units by mouth once a week. On Wednesday   fluticasone 50 MCG/ACT nasal spray Commonly known as:  FLONASE Place 2 sprays into both nostrils daily.   fluticasone 50 MCG/BLIST diskus inhaler Commonly known as:  FLOVENT DISKUS Inhale into the lungs.   HYDROcodone-acetaminophen 5-325 MG tablet Commonly known as:  NORCO/VICODIN Take 1 tablet by mouth every 6 (six) hours as needed for moderate pain.   memantine 10 MG tablet Commonly known as:  NAMENDA Take 10 mg by mouth 2 (two) times daily.   MYRBETRIQ 25 MG Tb24 tablet Generic drug:  mirabegron ER TAKE 1 TABLET (25 MG TOTAL) BY MOUTH 2  TIMES DAILY.   NORTREL 1/35 (28) tablet Generic drug:  norethindrone-ethinyl estradiol 1/35 Take by mouth.   oxycodone 5 MG capsule Commonly known as:  OXY-IR Take 5 mg by mouth every 6 (six) hours as needed for pain.   pantoprazole 40 MG tablet Commonly known as:  PROTONIX Take 40 mg by mouth daily.   polyethylene glycol powder powder Commonly known as:  GLYCOLAX/MIRALAX Take 17g twice daily for constipation.   senna 8.6 MG tablet Commonly known as:  SENOKOT Take by mouth.   TECFIDERA 240 MG Cpdr Generic drug:  Dimethyl Fumarate Take 240 mg by mouth 2 (two) times daily.   vitamin B-12 1000 MCG tablet Commonly known as:  CYANOCOBALAMIN Take 1,000 mcg by mouth daily.   Vitamin D-3 1000 units Caps Take 1,000 capsules by mouth 2 (two) times daily.       Allergies:  Allergies  Allergen Reactions  . Sulfa Antibiotics Other (See Comments)  . Tetanus Toxoids     Family History: Family History  Problem Relation Age of Onset  . Seizures Mother   . Emphysema Father   . Heart disease Brother   . Kidney disease Neg Hx   . Prostate cancer Neg Hx   . Bladder Cancer Neg Hx     Social History:  reports that she has never smoked. She has never used smokeless tobacco. She reports that she drinks  alcohol. She reports that she does not use drugs.  ROS: UROLOGY Frequent Urination?: Yes Hard to postpone urination?: Yes Burning/pain with urination?: No Get up at night to urinate?: Yes Leakage of urine?: No Urine stream starts and stops?: Yes Trouble starting stream?: No Do you have to strain to urinate?: No Blood in urine?: No Urinary tract infection?: No Sexually transmitted disease?: No Injury to kidneys or bladder?: No Painful intercourse?: No Weak stream?: No Currently pregnant?: No Vaginal bleeding?: No  Gastrointestinal Nausea?: No  Vomiting?: No Indigestion/heartburn?: No Diarrhea?: No Constipation?: No  Constitutional Fever: No Night sweats?: No Weight loss?: No Fatigue?: No  Skin Skin rash/lesions?: No Itching?: No  Eyes Blurred vision?: No Double vision?: No  Ears/Nose/Throat Sore throat?: No Sinus problems?: No  Hematologic/Lymphatic Swollen glands?: No Easy bruising?: No  Cardiovascular Leg swelling?: No Chest pain?: No  Respiratory Cough?: No Shortness of breath?: No  Endocrine Excessive thirst?: No  Musculoskeletal Back pain?: No Joint pain?: No  Neurological Headaches?: No Dizziness?: No  Psychologic Depression?: No Anxiety?: No  Physical Exam: BP 138/69   Pulse 88   Ht 5\' 5"  (1.651 m)   Wt 145 lb (65.8 kg)   BMI 24.13 kg/m   Constitutional: Well nourished. Alert and oriented, No acute distress. HEENT: [place] AT, moist mucus membranes. Trachea midline, no masses. Cardiovascular: No clubbing, cyanosis, or edema. Respiratory: Normal respiratory effort, no increased work of breathing. GI: Abdomen is soft, non tender, non distended, no abdominal masses. Liver and spleen not palpable.  No hernias appreciated.  Stool sample for occult testing is not indicated.   GU: No CVA tenderness.  No bladder fullness or masses.  Normal external genitalia, normal pubic hair distribution, no lesions.  Normal urethral meatus, no lesions,  no prolapse, no discharge.   No urethral masses, tenderness and/or tenderness. No bladder fullness, tenderness or masses. Normal vagina mucosa, good estrogen effect, no discharge, no lesions, good pelvic support, no cystocele or rectocele noted.  No cervical motion tenderness.  Uterus is freely mobile and non-fixed.  No adnexal/parametria masses or tenderness noted.  Anus and perineum are without rashes or lesions.    Skin: No rashes, bruises or suspicious lesions. Lymph: No cervical or inguinal adenopathy. Neurologic: Grossly intact, no focal deficits, moving all 4 extremities. Psychiatric: Normal mood and affect.   Pertinent Imaging: Result Impression  1. No CT examination of the patient's left lower quadrant pain. 2. Mild to moderate intra and extrahepatic biliary ductal dilatation status post cholecystectomy.  Result Narrative  EXAM: CT abdomen and pelvis with contrast DATE: 01/17/2017 12:35 PM ACCESSION: [id number] UN DICTATED: 01/17/2017 12:50 PM INTERPRETATION LOCATION: [facility]  CLINICAL INDICATION: 56 years old Female with ABDOMINAL PAIN, (specify site in comments)-left lower quadrantR10.32-Abdominal pain, acute, left lower quadrant  COMPARISON: 05/01/2016  TECHNIQUE: A spiral CT scan was obtained with IV contrast from the lung bases to the pubic symphysis.Images were reconstructed in the axial plane. Coronal and sagittal reformatted images were also provided for further evaluation.  FINDINGS: Heart is normal in size. No pericardial effusion is identified. Lung bases are clear. No pleural effusion.  Liver demonstrates a normal size and contour. No focal hepatic lesions are identified. Hepatic and portal veins are patent. Gallbladder is surgically absent. Mild to moderate intra and extrahepatic biliary ductal dilatation is identified with the common bile duct measuring 1.2 cm in caliber, tapering distally at the ampulla.  Spleen is normal in size. Adrenal glands are  unremarkable. Pancreas is unremarkable. Kidneys demonstrate symmetric cortical enhancement bilaterally without evidence of hydronephrosis or nephrolithiasis.  The aorta, celiac, SMA, bilateral renal arteries, and IMA are patent and of normal caliber. No free fluid or free air is identified. No pathologically enlarged adenopathy is identified.  The bowel demonstrates a normal caliber and is without evidence of bowel obstruction or focal bowel wall thickening.  Uterus is unremarkable. Ovaries are not well visualized. Bladder is decompressed.   No worrisome osseous lesions.  Other Result Information  Interface, Rad Results In - 01/17/2017  1:05  PM EST EXAM: CT abdomen and pelvis with contrast DATE: 01/17/2017 12:35 PM ACCESSION: [id number] UN DICTATED: 01/17/2017 12:50 PM INTERPRETATION LOCATION: [facility]  CLINICAL INDICATION: 56 years old Female with ABDOMINAL PAIN, (specify site in comments)-left lower quadrantR10.32-Abdominal pain, acute, left lower quadrant    COMPARISON: 05/01/2016  TECHNIQUE: A spiral CT scan was obtained with IV contrast from the lung bases to the pubic symphysis.  Images were reconstructed in the axial plane. Coronal and sagittal reformatted images were also provided for further evaluation.  FINDINGS: Heart is normal in size. No pericardial effusion is identified. Lung bases are clear. No pleural effusion.  Liver demonstrates a normal size and contour. No focal hepatic lesions are identified. Hepatic and portal veins are patent. Gallbladder is surgically absent. Mild to moderate intra and extrahepatic biliary ductal dilatation is identified with the common bile duct measuring 1.2 cm in caliber, tapering distally at the ampulla.  Spleen is normal in size. Adrenal glands are unremarkable. Pancreas is unremarkable. Kidneys demonstrate symmetric cortical enhancement bilaterally without evidence of hydronephrosis or nephrolithiasis.  The aorta, celiac, SMA, bilateral  renal arteries, and IMA are patent and of normal caliber. No free fluid or free air is identified. No pathologically enlarged adenopathy is identified.  The bowel demonstrates a normal caliber and is without evidence of bowel obstruction or focal bowel wall thickening.  Uterus is unremarkable. Ovaries are not well visualized. Bladder is decompressed.   No worrisome osseous lesions.   IMPRESSION: 1. No CT examination of the patient's left lower quadrant pain. 2. Mild to moderate intra and extrahepatic biliary ductal dilatation status post cholecystectomy.    Assessment & Plan:    1. Urinary frequency  - refills given for Myrbetriq 25 mg twice daily  - offered referral to PT- patient accepts and referral is made - she did not accept the referral and has not seen PT and does not want a referral at this time  - RTC for cystoscopy as she is still CIC somewhat excessively even with the Myrbetriq bid to ensure there is nothing inside the bladder causing irritation  2. Neurogenic bladder  - managing with CIC and Myrbetriq 25 mg to twice daily   - deferred PT  3. Nocturia  - she did not pursue a sleep study and she is drinking alcohol at night  - advised her the alcohol has detrimental effects on the sleep cycle and advised her to not have alcohol before bed  4. Alcohol abuse  - patient advised that alcohol is irritative to the bladder and to reduce her consumption     Return for cystoscopy .  Michiel CowboySHANNON Shundra Wirsing, PA-C  [facility] [address], [address] [place], [place] [address] [phone]

## 2017-10-27 ENCOUNTER — Ambulatory Visit (INDEPENDENT_AMBULATORY_CARE_PROVIDER_SITE_OTHER): Payer: Medicare HMO | Admitting: Urology

## 2017-10-27 ENCOUNTER — Encounter: Payer: Self-pay | Admitting: Urology

## 2017-10-27 VITALS — BP 138/69 | HR 88 | Ht 65.0 in | Wt 145.0 lb

## 2017-10-27 DIAGNOSIS — F1010 Alcohol abuse, uncomplicated: Secondary | ICD-10-CM | POA: Diagnosis not present

## 2017-10-27 DIAGNOSIS — N319 Neuromuscular dysfunction of bladder, unspecified: Secondary | ICD-10-CM

## 2017-10-27 DIAGNOSIS — R351 Nocturia: Secondary | ICD-10-CM | POA: Diagnosis not present

## 2017-10-27 DIAGNOSIS — R350 Frequency of micturition: Secondary | ICD-10-CM | POA: Diagnosis not present

## 2017-10-27 LAB — BLADDER SCAN AMB NON-IMAGING

## 2017-10-27 MED ORDER — MIRABEGRON ER 25 MG PO TB24: ORAL_TABLET | ORAL | 3 refills | Status: DC

## 2017-11-10 ENCOUNTER — Encounter: Payer: Self-pay | Admitting: Urology

## 2017-11-10 ENCOUNTER — Ambulatory Visit: Payer: Medicare HMO | Admitting: Urology

## 2017-11-10 VITALS — BP 107/63 | HR 96 | Ht 65.0 in | Wt 165.0 lb

## 2017-11-10 DIAGNOSIS — R350 Frequency of micturition: Secondary | ICD-10-CM | POA: Diagnosis not present

## 2017-11-10 DIAGNOSIS — N319 Neuromuscular dysfunction of bladder, unspecified: Secondary | ICD-10-CM

## 2017-11-10 LAB — URINALYSIS, COMPLETE
Bilirubin, UA: NEGATIVE
Glucose, UA: NEGATIVE
Ketones, UA: NEGATIVE
Leukocytes, UA: NEGATIVE
Nitrite, UA: POSITIVE — AB
Protein, UA: NEGATIVE
Specific Gravity, UA: 1.025 (ref 1.005–1.030)
Urobilinogen, Ur: 1 mg/dL (ref 0.2–1.0)
pH, UA: 5.5 (ref 5.0–7.5)

## 2017-11-10 LAB — MICROSCOPIC EXAMINATION
Epithelial Cells (non renal): NONE SEEN /hpf (ref 0–10)
WBC, UA: NONE SEEN /hpf (ref 0–?)

## 2017-11-10 MED ORDER — LIDOCAINE HCL 2 % EX GEL
1.0000 "application " | Freq: Once | CUTANEOUS | Status: AC
  Administered 2017-11-10: 1 via URETHRAL

## 2017-11-10 MED ORDER — LIDOCAINE HCL 2 % IJ SOLN: 50.0000 mL | Freq: Once | INTRAMUSCULAR | Status: DC

## 2017-11-10 MED ORDER — CIPROFLOXACIN HCL 500 MG PO TABS
500.0000 mg | ORAL_TABLET | Freq: Once | ORAL | Status: AC
  Administered 2017-11-10: 500 mg via ORAL

## 2017-11-10 NOTE — Progress Notes (Signed)
   11/10/17  CC:  Chief Complaint  Patient presents with  . Cysto    HPI: Pleasant 56 year old female with a neurogenic bladder currently on clean intermittent catheterization.  She catheterizes up to every 3 hours and gets up at night to catheterize otherwise she will have episodes of incontinence.  She is currently on Myrbetriq 25 mg.  She is irritated on how frequently she voids and also is bothered by her episodes of incontinence.  She wears depends for this.  Blood pressure 107/63, pulse 96, height 5\' 5"  (1.651 m), weight 165 lb (74.8 kg). NED. A&Ox3.   No respiratory distress   Abd soft, NT, ND Normal external genitalia with patent urethral meatus  Cystoscopy Procedure Note  Patient identification was confirmed, informed consent was obtained, and patient was prepped using Betadine solution.  Lidocaine jelly was administered per urethral meatus.    Preoperative abx where received prior to procedure.    Procedure: - Flexible cystoscope introduced, without any difficulty.   - Thorough search of the bladder revealed:    normal urethral meatus    normal urothelium    no stones    no ulcers     no tumors    no urethral polyps    moderate  - Ureteral orifices were normal in position and appearance.  Post-Procedure: - Patient tolerated the procedure well  Assessment/ Plan:  1. Neurogenic bladder- Cystoscopy unremarkable today We discussed today that she would be an excellent candidate for Botox.  We did use a higher dose for neurogenic bladder with the goal of bladder paralysis to stop her incontinence episodes.  She would continue to self cath possibly with decreased frequency and improved continence.  Risk of Botox were discussed in detail including failure, hematuria, infection, amongst others.  She was given information about the medication today.  She would like to proceed with Botox injection.  We will plan on doing the first injection in the operating room and if  she does well, subsequent injections of the office.  Vanna Scotland, MD

## 2017-11-10 NOTE — Addendum Note (Signed)
Addended by: Vanna Scotland on: 11/10/2017 10:04 AM   Modules accepted: Orders

## 2017-11-12 LAB — CULTURE, URINE COMPREHENSIVE

## 2017-11-13 ENCOUNTER — Telehealth: Payer: Self-pay

## 2017-11-13 MED ORDER — CEPHALEXIN 500 MG PO CAPS: 500.0000 mg | ORAL_CAPSULE | Freq: Two times a day (BID) | ORAL | 0 refills | Status: DC

## 2017-11-13 NOTE — Telephone Encounter (Signed)
-----   Message from Vanna Scotland, MD sent at 11/13/2017  1:22 PM EST ----- Please let this patient know that we checked a urine culture based on her urinalysis in the office.  Although she is not particularly symptomatic, some of her urgency symptoms may be related to overgrowth of bacteria, E. coli.  We will go ahead and treat with Keflex 500 mg 3 times daily times 7 days to see if this helps with her symptoms.  We will need to recheck a urine culture prior to Botox to ensure that her infection has been adequately treated.  Please arrange for repeat culture once abx completed.    Vanna Scotland, MD

## 2017-11-13 NOTE — Telephone Encounter (Signed)
Spoke with pt in reference to keflex and needing to check urine again after completing abx. Pt voiced understanding. abx sent to pharmacy.

## 2017-11-14 ENCOUNTER — Other Ambulatory Visit: Payer: Self-pay | Admitting: Radiology

## 2017-11-14 DIAGNOSIS — N319 Neuromuscular dysfunction of bladder, unspecified: Secondary | ICD-10-CM

## 2017-11-15 ENCOUNTER — Telehealth: Payer: Self-pay

## 2017-11-15 NOTE — Telephone Encounter (Signed)
PA for myrbetriq has been DENIED!  

## 2017-11-20 ENCOUNTER — Other Ambulatory Visit: Payer: Self-pay | Admitting: Urology

## 2017-11-20 ENCOUNTER — Other Ambulatory Visit: Payer: Self-pay | Admitting: Radiology

## 2017-11-20 ENCOUNTER — Telehealth: Payer: Self-pay | Admitting: Radiology

## 2017-11-20 NOTE — Telephone Encounter (Signed)
LMOM multiple times without a return call. Mailed surgery information to pt's home address.

## 2017-11-21 ENCOUNTER — Other Ambulatory Visit: Payer: Self-pay | Admitting: Radiology

## 2017-11-21 NOTE — Telephone Encounter (Signed)
Pt returned call, giving a new phone number. Discussed instructions for surgery scheduled with Dr Apolinar Junes on 12/04/17. Questions answered. Pt voices understanding.

## 2017-11-23 ENCOUNTER — Encounter
Admission: RE | Admit: 2017-11-23 | Discharge: 2017-11-23 | Disposition: A | Discharge status: Home / Self Care | Payer: Medicare HMO | Source: Ambulatory Visit | Attending: Urology | Admitting: Urology

## 2017-11-23 ENCOUNTER — Encounter: Payer: Self-pay | Admitting: *Deleted

## 2017-11-23 ENCOUNTER — Other Ambulatory Visit: Payer: Self-pay

## 2017-11-23 DIAGNOSIS — G35 Multiple sclerosis: Secondary | ICD-10-CM | POA: Diagnosis not present

## 2017-11-23 DIAGNOSIS — N319 Neuromuscular dysfunction of bladder, unspecified: Secondary | ICD-10-CM | POA: Diagnosis not present

## 2017-11-23 DIAGNOSIS — R269 Unspecified abnormalities of gait and mobility: Secondary | ICD-10-CM | POA: Diagnosis not present

## 2017-11-23 DIAGNOSIS — R350 Frequency of micturition: Secondary | ICD-10-CM | POA: Insufficient documentation

## 2017-11-23 DIAGNOSIS — Z01812 Encounter for preprocedural laboratory examination: Secondary | ICD-10-CM | POA: Insufficient documentation

## 2017-11-23 HISTORY — DX: Other specified health status: Z78.9

## 2017-11-23 LAB — URINALYSIS, ROUTINE W REFLEX MICROSCOPIC
Bilirubin Urine: NEGATIVE
Glucose, UA: NEGATIVE mg/dL
Hgb urine dipstick: NEGATIVE
Ketones, ur: NEGATIVE mg/dL
Leukocytes, UA: NEGATIVE
Nitrite: NEGATIVE
Protein, ur: NEGATIVE mg/dL
Specific Gravity, Urine: 1.012 (ref 1.005–1.030)
pH: 6 (ref 5.0–8.0)

## 2017-11-23 NOTE — Patient Instructions (Signed)
Your procedure is scheduled on: Monday, December 04, 2017 Report to Same Day Surgery on the 2nd floor in the [facility]. To find out your arrival time, please call [phone] between 1PM - 3PM on: Friday, December 01, 2017  REMEMBER: Instructions that are not followed completely may result in serious medical risk, up to and including death; or upon the discretion of your surgeon and anesthesiologist your surgery may need to be rescheduled.  Do not eat food after midnight the night before your procedure.  No gum chewing or hard candies.  You may however, drink CLEAR liquids up to 2 hours before you are scheduled to arrive at the hospital for your procedure.  Do not drink clear liquids within 2 hours of the start of your surgery.  Clear liquids include: - water  - apple juice without pulp - clear gatorade - black coffee or tea (Do NOT add anything to the coffee or tea) Do NOT drink anything that is not on this list.  No Alcohol for 24 hours before or after surgery.  No Smoking including e-cigarettes for 24 hours prior to surgery. No chewable tobacco products for at least 6 hours prior to surgery. No nicotine patches on the day of surgery.  Notify your doctor if there is any change in your medical condition (cold, fever, infection).  Do not wear jewelry, make-up, hairpins, clips or nail polish.  Do not wear lotions, powders, or perfumes. You may wear deodorant.  Do not shave 48 hours prior to surgery.   Contacts and dentures may not be worn into surgery.  Do not bring valuables to the hospital. [facility] is not responsible for any belongings or valuables.   TAKE THESE MEDICATIONS THE MORNING OF SURGERY WITH A SIP OF WATER:  1.  Baclofen 2.  Ampyra 3.  Protonix (take one tablet the night before surgery and one tablet the morning of surgery - helps to prevent nausea after surgery)  NOW!! Stop Anti-inflammatories such as Advil, Aleve, Ibuprofen, Motrin, Naproxen, Naprosyn,  Goodie powder, or aspirin products. (May take Tylenol or Acetaminophen if needed.)  NOW!! Stop ANY OVER THE COUNTER supplements until after surgery. (May continue Vitamin D and Vitamin B.)  If you are being discharged the day of surgery, you will not be allowed to drive home. You will need someone to drive you home and stay with you that night.   If you are taking public transportation, you will need to have a responsible adult to with you.  Please call the number above if you have any questions about these instructions.

## 2017-11-24 LAB — URINE CULTURE: Culture: NO GROWTH

## 2017-11-27 ENCOUNTER — Inpatient Hospital Stay: Admission: RE | Admit: 2017-11-27 | Payer: Medicare HMO | Source: Ambulatory Visit

## 2017-11-29 ENCOUNTER — Other Ambulatory Visit: Payer: Self-pay | Admitting: Radiology

## 2017-12-04 ENCOUNTER — Ambulatory Visit: Admission: RE | Admit: 2017-12-04 | Payer: Medicare HMO | Source: Ambulatory Visit | Admitting: Urology

## 2017-12-04 ENCOUNTER — Encounter: Admission: RE | Payer: Self-pay | Source: Ambulatory Visit

## 2017-12-04 SURGERY — BOTOX INJECTION
Anesthesia: Choice

## 2017-12-11 ENCOUNTER — Other Ambulatory Visit: Payer: Self-pay | Admitting: Radiology

## 2017-12-11 DIAGNOSIS — N319 Neuromuscular dysfunction of bladder, unspecified: Secondary | ICD-10-CM

## 2017-12-12 MED ORDER — CEFAZOLIN SODIUM-DEXTROSE 1-4 GM/50ML-% IV SOLN
1.0000 g | INTRAVENOUS | Status: AC
  Administered 2017-12-13: 1 g via INTRAVENOUS

## 2017-12-13 ENCOUNTER — Encounter
Admission: RE | Disposition: A | Discharge status: Home / Self Care | Payer: Self-pay | Source: Ambulatory Visit | Attending: Urology

## 2017-12-13 ENCOUNTER — Encounter: Payer: Self-pay | Admitting: Certified Registered Nurse Anesthetist

## 2017-12-13 ENCOUNTER — Ambulatory Visit: Payer: Medicare HMO | Admitting: Certified Registered Nurse Anesthetist

## 2017-12-13 ENCOUNTER — Other Ambulatory Visit: Payer: Self-pay

## 2017-12-13 ENCOUNTER — Ambulatory Visit
Admission: RE | Admit: 2017-12-13 | Discharge: 2017-12-13 | Disposition: A | Discharge status: Home / Self Care | Payer: Medicare HMO | Source: Ambulatory Visit | Attending: Urology | Admitting: Urology

## 2017-12-13 DIAGNOSIS — N3941 Urge incontinence: Secondary | ICD-10-CM | POA: Insufficient documentation

## 2017-12-13 DIAGNOSIS — N319 Neuromuscular dysfunction of bladder, unspecified: Secondary | ICD-10-CM | POA: Diagnosis not present

## 2017-12-13 DIAGNOSIS — R269 Unspecified abnormalities of gait and mobility: Secondary | ICD-10-CM | POA: Insufficient documentation

## 2017-12-13 DIAGNOSIS — M1990 Unspecified osteoarthritis, unspecified site: Secondary | ICD-10-CM | POA: Insufficient documentation

## 2017-12-13 DIAGNOSIS — G35 Multiple sclerosis: Secondary | ICD-10-CM | POA: Insufficient documentation

## 2017-12-13 HISTORY — PX: BOTOX INJECTION: SHX5754

## 2017-12-13 HISTORY — PX: CYSTOSCOPY: SHX5120

## 2017-12-13 LAB — POCT PREGNANCY, URINE: Preg Test, Ur: NEGATIVE

## 2017-12-13 SURGERY — BOTOX INJECTION
Anesthesia: General | Wound class: Clean Contaminated

## 2017-12-13 MED ORDER — CEFAZOLIN SODIUM-DEXTROSE 1-4 GM/50ML-% IV SOLN
INTRAVENOUS | Status: AC
  Filled 2017-12-13: qty 50

## 2017-12-13 MED ORDER — MIDAZOLAM HCL 2 MG/2ML IJ SOLN
INTRAMUSCULAR | Status: AC
  Filled 2017-12-13: qty 2

## 2017-12-13 MED ORDER — SODIUM CHLORIDE 0.9 % IJ SOLN
INTRAMUSCULAR | Status: AC
  Filled 2017-12-13: qty 30

## 2017-12-13 MED ORDER — ONDANSETRON HCL 4 MG/2ML IJ SOLN
INTRAMUSCULAR | Status: DC | PRN
  Administered 2017-12-13: 4 mg via INTRAVENOUS

## 2017-12-13 MED ORDER — FENTANYL CITRATE (PF) 100 MCG/2ML IJ SOLN: 25.0000 ug | INTRAMUSCULAR | Status: DC | PRN

## 2017-12-13 MED ORDER — PROPOFOL 10 MG/ML IV BOLUS
INTRAVENOUS | Status: DC | PRN
  Administered 2017-12-13: 20 mg via INTRAVENOUS
  Administered 2017-12-13: 130 mg via INTRAVENOUS
  Administered 2017-12-13: 50 mg via INTRAVENOUS

## 2017-12-13 MED ORDER — FENTANYL CITRATE (PF) 100 MCG/2ML IJ SOLN
INTRAMUSCULAR | Status: DC | PRN
  Administered 2017-12-13: 25 ug via INTRAVENOUS
  Administered 2017-12-13: 50 ug via INTRAVENOUS
  Administered 2017-12-13: 25 ug via INTRAVENOUS

## 2017-12-13 MED ORDER — LIDOCAINE HCL (PF) 2 % IJ SOLN
INTRAMUSCULAR | Status: AC
  Filled 2017-12-13: qty 10

## 2017-12-13 MED ORDER — FUROSEMIDE 10 MG/ML IJ SOLN
INTRAMUSCULAR | Status: AC
  Filled 2017-12-13: qty 4

## 2017-12-13 MED ORDER — FENTANYL CITRATE (PF) 100 MCG/2ML IJ SOLN
INTRAMUSCULAR | Status: AC
  Filled 2017-12-13: qty 2

## 2017-12-13 MED ORDER — DEXAMETHASONE SODIUM PHOSPHATE 10 MG/ML IJ SOLN
INTRAMUSCULAR | Status: DC | PRN
  Administered 2017-12-13: 10 mg via INTRAVENOUS

## 2017-12-13 MED ORDER — PROPOFOL 10 MG/ML IV BOLUS
INTRAVENOUS | Status: AC
  Filled 2017-12-13: qty 20

## 2017-12-13 MED ORDER — DEXAMETHASONE SODIUM PHOSPHATE 10 MG/ML IJ SOLN
INTRAMUSCULAR | Status: AC
  Filled 2017-12-13: qty 1

## 2017-12-13 MED ORDER — MIDAZOLAM HCL 2 MG/2ML IJ SOLN
INTRAMUSCULAR | Status: DC | PRN
  Administered 2017-12-13: 1 mg via INTRAVENOUS

## 2017-12-13 MED ORDER — ONDANSETRON HCL 4 MG/2ML IJ SOLN: 4.0000 mg | Freq: Once | INTRAMUSCULAR | Status: DC | PRN

## 2017-12-13 MED ORDER — ONDANSETRON HCL 4 MG/2ML IJ SOLN
INTRAMUSCULAR | Status: AC
  Filled 2017-12-13: qty 2

## 2017-12-13 MED ORDER — ONABOTULINUMTOXINA 100 UNITS IJ SOLR
200.0000 [IU] | Freq: Once | INTRAMUSCULAR | Status: DC
  Filled 2017-12-13: qty 200

## 2017-12-13 MED ORDER — SODIUM CHLORIDE 0.9 % IJ SOLN
INTRAMUSCULAR | Status: DC | PRN
  Administered 2017-12-13: 8 mL via SUBMUCOSAL

## 2017-12-13 MED ORDER — LACTATED RINGERS IV SOLN
INTRAVENOUS | Status: DC
  Administered 2017-12-13: 14:00:00 via INTRAVENOUS

## 2017-12-13 MED ORDER — LIDOCAINE HCL (CARDIAC) 20 MG/ML IV SOLN
INTRAVENOUS | Status: DC | PRN
  Administered 2017-12-13: 100 mg via INTRAVENOUS

## 2017-12-13 SURGICAL SUPPLY — 15 items
BAG DRAIN CYSTO-URO LG1000N (MISCELLANEOUS) ×2 IMPLANT
BRUSH SCRUB EZ 1% IODOPHOR (MISCELLANEOUS) ×2 IMPLANT
CATH URETL 5X70 OPEN END (CATHETERS) ×2 IMPLANT
GLOVE BIO SURGEON STRL SZ 6.5 (GLOVE) ×2 IMPLANT
GOWN STRL REUS W/ TWL LRG LVL3 (GOWN DISPOSABLE) ×2 IMPLANT
GOWN STRL REUS W/TWL LRG LVL3 (GOWN DISPOSABLE) ×2
NEEDLE INJETAK FLEX 70CM BOTOX (NEEDLE) ×2 IMPLANT
PACK CYSTO AR (MISCELLANEOUS) ×2 IMPLANT
SENSORWIRE 0.038 NOT ANGLED (WIRE) ×4
SET CYSTO W/LG BORE CLAMP LF (SET/KITS/TRAYS/PACK) ×2 IMPLANT
SOL .9 NS 3000ML IRR  AL (IV SOLUTION) ×1
SOL .9 NS 3000ML IRR UROMATIC (IV SOLUTION) ×1 IMPLANT
SURGILUBE 2OZ TUBE FLIPTOP (MISCELLANEOUS) ×2 IMPLANT
WATER STERILE IRR 1000ML POUR (IV SOLUTION) ×2 IMPLANT
WIRE SENSOR 0.038 NOT ANGLED (WIRE) ×2 IMPLANT

## 2017-12-13 NOTE — Anesthesia Procedure Notes (Signed)
Procedure Name: LMA Insertion Date/Time: 12/13/2017 3:49 PM Performed by: Ginger CarneMichelet, Kirin Pastorino, CRNA Pre-anesthesia Checklist: Patient identified, Emergency Drugs available, Suction available, Patient being monitored and Timeout performed Patient Re-evaluated:Patient Re-evaluated prior to induction Oxygen Delivery Method: Circle system utilized Preoxygenation: Pre-oxygenation with 100% oxygen Induction Type: IV induction LMA: LMA inserted LMA Size: 3.5 and 3.0 Number of attempts: 2 Placement Confirmation: positive ETCO2 and breath sounds checked- equal and bilateral Tube secured with: Tape Dental Injury: Teeth and Oropharynx as per pre-operative assessment

## 2017-12-13 NOTE — Op Note (Signed)
Date of procedure: 12/13/17  Preoperative diagnosis:  1. Neurogenic bladder  2. Urge incontinence   Postoperative diagnosis:  1. Same  Procedure: 1. Cystoscopy 2. Injection of Botox in the bladder  Surgeon: Vanna Scotland, MD  Anesthesia: General  Complications: None  Intraoperative findings: Unremarkable bladder  EBL: Minimal  Specimens: None  Drains: None  Indication: Adrienne Strickland is a 57 y.o. patient with neurogenic bladder on clean intermittent catheterization who has episodes of urge incontinence which is quite bothersome..  After reviewing the management options for treatment, she elected to proceed with the above surgical procedure(s). We have discussed the potential benefits and risks of the procedure, side effects of the proposed treatment, the likelihood of the patient achieving the goals of the procedure, and any potential problems that might occur during the procedure or recuperation. Informed consent has been obtained.  Description of procedure:  The patient was taken to the operating room and general anesthesia was induced.  The patient was placed in the dorsal lithotomy position, prepped and draped in the usual sterile fashion, and preoperative antibiotics were administered. A preoperative time-out was performed.   A 21 French cystoscope was advanced per urethra into the bladder.  The bladder was carefully inspected and was noted to be an otherwise unremarkable.  There were no lesions, tumors, or ulcerations.  No erythema.  The trigone was in normal anatomic position.  A Botox injection needle was used to inject a total of 200 units with 10 injections in 3 rows just beyond the trigone.  31st injection was performed to flush the needle.  Hemostasis was adequate, the bladder was drained and the scope was removed.  She reversed from anesthesia, and taken the PACU in stable condition.  There were no complications of this case.  Plan: We will reassess her bladder  symptoms in 4 weeks.  Vanna Scotland, M.D.

## 2017-12-13 NOTE — Anesthesia Preprocedure Evaluation (Addendum)
Anesthesia Evaluation  Patient identified by MRN, date of birth, ID band Patient awake    Reviewed: Allergy & Precautions, NPO status , Patient's Chart, lab work & pertinent test results  Airway Mallampati: II  TM Distance: <3 FB     Dental  (+) Teeth Intact, Chipped   Pulmonary neg pulmonary ROS,    Pulmonary exam normal        Cardiovascular negative cardio ROS Normal cardiovascular exam     Neuro/Psych Memory difficulty, MS, and neurogenic bladder, and abnormality of gait  Neuromuscular disease negative psych ROS   GI/Hepatic negative GI ROS, Neg liver ROS,   Endo/Other  negative endocrine ROS  Renal/GU  Bladder dysfunction      Musculoskeletal  (+) Arthritis , Osteoarthritis,    Abdominal Normal abdominal exam  (+)   Peds negative pediatric ROS (+)  Hematology negative hematology ROS (+)   Anesthesia Other Findings Past Medical History: 09/26/2013: Abnormality of gait No date: Arthritis No date: Memory difficulty 09/26/2013: Multiple sclerosis (HCC) No date: Multiple sclerosis (HCC) No date: Neurogenic bladder     Comment:  self caths No date: Self-catheterizes urinary bladder No date: Urinary frequency  Reproductive/Obstetrics                            Anesthesia Physical Anesthesia Plan  ASA: III  Anesthesia Plan: General   Post-op Pain Management:    Induction: Intravenous  PONV Risk Score and Plan:   Airway Management Planned: LMA and Oral ETT  Additional Equipment:   Intra-op Plan:   Post-operative Plan: Extubation in OR  Informed Consent: I have reviewed the patients History and Physical, chart, labs and discussed the procedure including the risks, benefits and alternatives for the proposed anesthesia with the patient or authorized representative who has indicated his/her understanding and acceptance.   Dental advisory given  Plan Discussed with: CRNA and  Surgeon  Anesthesia Plan Comments:         Anesthesia Quick Evaluation

## 2017-12-13 NOTE — Anesthesia Post-op Follow-up Note (Signed)
Anesthesia QCDR form completed.        

## 2017-12-13 NOTE — H&P (Signed)
   11/10/17  --> updated 12/23/17 without change RRR CTAB  CC:     Chief Complaint  Patient presents with  . Cysto    HPI: Pleasant 56 year old female with a neurogenic bladder currently on clean intermittent catheterization.  She catheterizes up to every 3 hours and gets up at night to catheterize otherwise she will have episodes of incontinence.  She is currently on Myrbetriq 25 mg.  She is irritated on how frequently she voids and also is bothered by her episodes of incontinence.  She wears depends for this.  Blood pressure 107/63, pulse 96, height 5\' 5"  (1.651 m), weight 165 lb (74.8 kg). NED. A&Ox3.   No respiratory distress   Abd soft, NT, ND Normal external genitalia with patent urethral meatus  Cystoscopy Procedure Note  Patient identification was confirmed, informed consent was obtained, and patient was prepped using Betadine solution.  Lidocaine jelly was administered per urethral meatus.    Preoperative abx where received prior to procedure.    Procedure: - Flexible cystoscope introduced, without any difficulty.   - Thorough search of the bladder revealed:    normal urethral meatus    normal urothelium    no stones    no ulcers     no tumors    no urethral polyps    moderate  - Ureteral orifices were normal in position and appearance.  Post-Procedure: - Patient tolerated the procedure well  Assessment/ Plan:  1. Neurogenic bladder- Cystoscopy unremarkable today We discussed today that she would be an excellent candidate for Botox.  We did use a higher dose for neurogenic bladder with the goal of bladder paralysis to stop her incontinence episodes.  She would continue to self cath possibly with decreased frequency and improved continence.  Risk of Botox were discussed in detail including failure, hematuria, infection, amongst others.  She was given information about the medication today.  She would like to proceed with Botox injection.  We  will plan on doing the first injection in the operating room and if she does well, subsequent injections of the office.  Vanna Scotland, MD

## 2017-12-13 NOTE — Transfer of Care (Signed)
Immediate Anesthesia Transfer of Care Note  Patient: Adrienne Strickland  Procedure(s) Performed: BOTOX INJECTION (N/A ) CYSTOSCOPY (N/A )  Patient Location: PACU  Anesthesia Type:General  Level of Consciousness: sedated  Airway & Oxygen Therapy: Patient Spontanous Breathing and Patient connected to face mask oxygen  Post-op Assessment: Report given to RN and Post -op Vital signs reviewed and stable  Post vital signs: Reviewed and stable  Last Vitals:  Vitals:   12/13/17 1357 12/13/17 1632  BP: 120/71 128/66  Pulse: 77 100  Resp: 16 (!) 21  Temp: 36.6 C 36.9 C  SpO2: 100% 100%    Last Pain:  Vitals:   12/13/17 1357  TempSrc: Oral  PainSc:          Complications: No apparent anesthesia complications

## 2017-12-13 NOTE — Discharge Instructions (Signed)
Botulinum Toxin Bladder Injection  A botulinum toxin bladder injection is a procedure to treat an overactive bladder. During the procedure, a drug called botulinum toxin is injected into the bladder through a long, thin needle. This drug relaxes the bladder muscles and reduces overactivity.  You may need this procedure if your medicines are not working or you cannot take them. The procedure may be repeated as needed.  Tell a health care provider about:  · Any allergies you have.  · All medicines you are taking, including vitamins, herbs, eye drops, creams, and over-the-counter medicines.  · Any problems you or family members have had with anesthetic medicines.  · Any blood disorders you have.  · Any surgeries you have had.  · Other medical conditions you have.  · Any previous reactions to a botulinum toxin injection.  · Any symptoms of urinary tract infection. These include chills, fever, a burning feeling when passing urine, and needing to pass urine often.  What are the risks?  Generally this is a safe procedure. However, problems may occur, including:  · Not being able to pass urine. If this happens, you may need to have your bladder emptied with a thin tube (bladder catheter).  · Bleeding.  · Urinary tract infection.  · Allergic reaction to the botulinum toxin.  · Pain or burning when passing urine.  · Damage to other structures or organs.    What happens before the procedure?  · Ask your health care provider about:  ? Changing or stopping your regular medicines. This is especially important if you are taking diabetes medicines or blood thinners.  ? Taking medicines such as aspirin and ibuprofen. These medicines can thin your blood. Do not take these medicines before your procedure if your health care provider instructs you not to.  · Follow instructions from your health care provider about eating or drinking restrictions.  · You may be given antibiotic medicine to help prevent infection.   · Plan to have someone take you home after the procedure.  What happens during the procedure?  · You will be asked to empty your bladder.  · To reduce your risk of infection:  ? Your health care team will wash or sanitize their hands.  ? Your skin will be washed with soap.  · An IV tube will be inserted into one of your veins.  · You will be given one or more of the following:  ? A medicine to help you relax (sedative).  ? A medicine to numb the area (local anesthetic).  ? A medicine to make you fall asleep (general anesthetic).  · A long, thin scope called a cystoscope will be passed into your bladder through the tube that carries urine from your bladder (urethra).  · The cystoscope will be used to fill your bladder with water.  · A long needle will be passed through the cystoscope and into the bladder.  · The botulinum toxin will be injected into your bladder. It may be injected into multiple areas of your bladder.  · Your bladder will be emptied, and the cystoscope will be removed.  The procedure may vary among health care providers and hospitals.  What happens after the procedure?  · Your blood pressure, heart rate, breathing rate, and blood oxygen level will be monitored often until the medicines you were given have worn off.  · Do not drive for 24 hours if you received a sedative.  This information is not intended to replace advice   given to you by your health care provider. Make sure you discuss any questions you have with your health care provider.  Document Released: 09/02/2015 Document Revised: 05/19/2016 Document Reviewed: 06/10/2015  Elsevier Interactive Patient Education © 2018 [employer]

## 2017-12-13 NOTE — Anesthesia Postprocedure Evaluation (Signed)
Anesthesia Post Note  Patient: Adrienne Strickland  Procedure(s) Performed: BOTOX INJECTION (N/A ) CYSTOSCOPY (N/A )  Patient location during evaluation: PACU Anesthesia Type: General Level of consciousness: awake and alert and oriented Pain management: pain level controlled Vital Signs Assessment: post-procedure vital signs reviewed and stable Respiratory status: spontaneous breathing, nonlabored ventilation and respiratory function stable Cardiovascular status: blood pressure returned to baseline and stable Postop Assessment: no signs of nausea or vomiting Anesthetic complications: no     Last Vitals:  Vitals:   12/13/17 1706 12/13/17 1744  BP: 121/78 (!) 128/56  Pulse: 83 78  Resp: 20   Temp: 36.9 C   SpO2: 100% 100%    Last Pain:  Vitals:   12/13/17 1702  TempSrc:   PainSc: 0-No pain                 Eryca Bolte

## 2017-12-13 NOTE — Progress Notes (Signed)
No complaints   States wants to go home

## 2017-12-14 ENCOUNTER — Encounter: Payer: Self-pay | Admitting: Urology

## 2018-01-02 ENCOUNTER — Ambulatory Visit: Payer: Medicare HMO | Admitting: Urology

## 2018-01-09 ENCOUNTER — Encounter: Payer: Self-pay | Admitting: Urology

## 2018-01-09 ENCOUNTER — Ambulatory Visit: Payer: Medicare HMO | Admitting: Urology

## 2018-01-09 ENCOUNTER — Ambulatory Visit (INDEPENDENT_AMBULATORY_CARE_PROVIDER_SITE_OTHER): Payer: Medicare HMO | Admitting: Urology

## 2018-01-09 VITALS — BP 140/66 | HR 94 | Ht 65.0 in | Wt 150.0 lb

## 2018-01-09 DIAGNOSIS — N319 Neuromuscular dysfunction of bladder, unspecified: Secondary | ICD-10-CM

## 2018-01-09 DIAGNOSIS — R3915 Urgency of urination: Secondary | ICD-10-CM | POA: Diagnosis not present

## 2018-01-09 NOTE — Progress Notes (Signed)
01/09/2018 6:01 PM   Adrienne Strickland 07-16-1961 [id number]  Referring provider: Sharilyn Sites, MD [address] [facility] [place], [place] [address]  Chief Complaint  Patient presents with  . f/u botox    HPI: 57 year old female with MS, neurogenic bladder who presents today status post Botox injection to the bladder on 12/13/2017.  She manages her bladder with clean intermittent catheterization.  She voids very rarely spontaneous on her own.  She was very bothered by urinary urgency without the ability to void.  This is only relieved by cathing.  She reports that since having the Botox (200 international units), her urinary urgency has decreased dramatically.  She is now able to sleep through most of the night without having to get up cath.  She is extremely pleased with her result.  She is interested in pursuing Botox again in the future.   PMH: Past Medical History:  Diagnosis Date  . Abnormality of gait 09/26/2013  . Arthritis   . Memory difficulty   . Multiple sclerosis (HCC) 09/26/2013  . Multiple sclerosis (HCC)   . Neurogenic bladder    self caths  . Self-catheterizes urinary bladder   . Urinary frequency     Surgical History: Past Surgical History:  Procedure Laterality Date  . BOTOX INJECTION N/A 12/13/2017   Procedure: BOTOX INJECTION;  Surgeon: Vanna Scotland, MD;  Location: [facility] ORS;  Service: Urology;  Laterality: N/A;  . CYSTOSCOPY N/A 12/13/2017   Procedure: CYSTOSCOPY;  Surgeon: Vanna Scotland, MD;  Location: [facility] ORS;  Service: Urology;  Laterality: N/A;  . GALLBLADDER SURGERY      Home Medications:  Allergies as of 01/09/2018      Reactions   Sulfa Antibiotics Nausea And Vomiting   Tetanus Toxoids Swelling   Extreme swelling at injection site      Medication List        Accurate as of 01/09/18 11:59 PM. Always use your most recent med list.          AMPYRA 10 MG Tb12 Generic drug:  dalfampridine Take 10 mg by mouth  2 (two) times daily.   baclofen 10 MG tablet Commonly known as:  LIORESAL Take 10 mg by mouth 2 (two) times daily.   donepezil 10 MG tablet Commonly known as:  ARICEPT Take 10 mg by mouth at bedtime.   fluticasone 50 MCG/ACT nasal spray Commonly known as:  FLONASE Place 2 sprays into both nostrils daily.   memantine 10 MG tablet Commonly known as:  NAMENDA Take 10 mg by mouth 2 (two) times daily.   mirabegron ER 25 MG Tb24 tablet Commonly known as:  MYRBETRIQ TAKE 1 TABLET (25 MG TOTAL) BY MOUTH 2  TIMES DAILY.   NORTREL 1/35 (28) tablet Generic drug:  norethindrone-ethinyl estradiol 1/35 Take 1 tablet by mouth daily.   oxycodone 5 MG capsule Commonly known as:  OXY-IR Take 5 mg by mouth every 6 (six) hours.   pantoprazole 40 MG tablet Commonly known as:  PROTONIX Take 40 mg by mouth daily.   polyethylene glycol powder powder Commonly known as:  GLYCOLAX/MIRALAX Take 17g daily for constipation.   senna 8.6 MG tablet Commonly known as:  SENOKOT Take 1 tablet by mouth 2 (two) times daily.   TECFIDERA 240 MG Cpdr Generic drug:  Dimethyl Fumarate Take 240 mg by mouth 2 (two) times daily.   vitamin B-12 1000 MCG tablet Commonly known as:  CYANOCOBALAMIN Take 1,000 mcg by mouth daily.   Vitamin D-3  1000 units Caps Take 1,000 Units by mouth daily.       Allergies:  Allergies  Allergen Reactions  . Sulfa Antibiotics Nausea And Vomiting  . Tetanus Toxoids Swelling    Extreme swelling at injection site    Family History: Family History  Problem Relation Age of Onset  . Seizures Mother   . Emphysema Father   . Heart disease Brother   . Kidney disease Neg Hx   . Prostate cancer Neg Hx   . Bladder Cancer Neg Hx     Social History:  reports that  has never smoked. she has never used smokeless tobacco. She reports that she drinks alcohol. She reports that she does not use drugs.  ROS: UROLOGY Frequent Urination?: No Hard to postpone urination?:  No Burning/pain with urination?: No Get up at night to urinate?: No Leakage of urine?: No Urine stream starts and stops?: No Trouble starting stream?: No Do you have to strain to urinate?: No Blood in urine?: No Urinary tract infection?: No Sexually transmitted disease?: No Injury to kidneys or bladder?: No Painful intercourse?: No Weak stream?: No Currently pregnant?: No Vaginal bleeding?: No Last menstrual period?: n  Gastrointestinal Nausea?: No Vomiting?: No Indigestion/heartburn?: No Diarrhea?: Yes Constipation?: No  Constitutional Fever: No Night sweats?: No Weight loss?: No Fatigue?: No  Skin Skin rash/lesions?: No Itching?: No  Eyes Blurred vision?: No Double vision?: No  Ears/Nose/Throat Sore throat?: No Sinus problems?: No  Hematologic/Lymphatic Swollen glands?: No Easy bruising?: No  Cardiovascular Leg swelling?: No Chest pain?: No  Respiratory Cough?: No Shortness of breath?: No  Endocrine Excessive thirst?: No  Musculoskeletal Back pain?: No Joint pain?: No  Neurological Headaches?: No Dizziness?: No  Psychologic Depression?: No Anxiety?: No  Physical Exam: BP 140/66   Pulse 94   Ht 5\' 5"  (1.651 m)   Wt 150 lb (68 kg)   BMI 24.96 kg/m   Constitutional:  Alert and oriented, No acute distress.  Relating with walker. HEENT: [place] AT, moist mucus membranes.  Trachea midline, no masses. Cardiovascular: No clubbing, cyanosis, or edema. Respiratory: Normal respiratory effort, no increased work of breathing. Neurologic: Grossly intact, no focal deficits, moving all 4 extremities.  Occasional word finding difficulties. Psychiatric: Normal mood and affect.  Laboratory Data: N/a  Urinalysis n/a  Pertinent Imaging: N/a  Assessment & Plan:    1. Neurogenic bladder Managed by [facility] Doing extremely well with Botox for urinary urgency/frequency Will likely want to continue this medication We will tentatively plan to repeat this  in the office once this particular dose has worn off, 200 IU  2. Urinary urgency As above  Return in about 4 months (around 05/09/2018) for recheck urinary symptoms.  Vanna Scotland, MD  [facility] [address] [place], [place] [address] [phone]

## 2018-01-15 ENCOUNTER — Encounter: Payer: Self-pay | Admitting: Urology

## 2018-03-08 ENCOUNTER — Encounter: Payer: Self-pay | Admitting: Emergency Medicine

## 2018-03-08 ENCOUNTER — Other Ambulatory Visit: Payer: Self-pay

## 2018-03-08 ENCOUNTER — Ambulatory Visit
Admission: EM | Admit: 2018-03-08 | Discharge: 2018-03-08 | Disposition: A | Discharge status: Home / Self Care | Payer: Medicare HMO | Attending: Family Medicine | Admitting: Family Medicine

## 2018-03-08 DIAGNOSIS — N390 Urinary tract infection, site not specified: Secondary | ICD-10-CM | POA: Diagnosis not present

## 2018-03-08 DIAGNOSIS — R319 Hematuria, unspecified: Secondary | ICD-10-CM

## 2018-03-08 LAB — URINALYSIS, COMPLETE (UACMP) WITH MICROSCOPIC
Bilirubin Urine: NEGATIVE
Glucose, UA: NEGATIVE mg/dL
Leukocytes, UA: NEGATIVE
Nitrite: POSITIVE — AB
Protein, ur: NEGATIVE mg/dL
Specific Gravity, Urine: 1.015 (ref 1.005–1.030)
pH: 6.5 (ref 5.0–8.0)

## 2018-03-08 MED ORDER — CEPHALEXIN 500 MG PO CAPS: 500.0000 mg | ORAL_CAPSULE | Freq: Two times a day (BID) | ORAL | 0 refills | Status: AC

## 2018-03-08 NOTE — Discharge Instructions (Signed)
Take medication as prescribed. Rest. Drink plenty of fluids.Monitor and avoid triggers.  Follow up with your primary care physician this week as needed. Return to Urgent care for new or worsening concerns.

## 2018-03-08 NOTE — ED Provider Notes (Signed)
[facility] ____________________________________________  Time seen: Approximately 1030 AM  I have reviewed the triage vital signs and the nursing notes.   HISTORY  Chief Complaint Dysuria   HPI Adrienne Strickland is a 57 y.o. female past medical history of MS and neurogenic bladder who primarily self caths with intermittent voiding presenting for evaluation of burning with urination present this morning.  States she was urinating normally and felt burning irritation consistent with previous UTIs.  States no change in self cath or technique.  States sexually active with same partner, denies vaginal discharge, vaginal discomfort or concern of STDs.  Reports a potential trigger is a sex toy that she and her partner have but other than that denies any other known trigger.  Reports some generalized mild achy back pain. denies flank pain or abdominal pain.  Denies fevers, vomiting, diarrhea or other complaints.  Reports otherwise feels well.  Denies recent UTI.  Denies past resistant to antibiotics. Denies chest pain, shortness of breath, abdominal pain,  or rash. Denies recent sickness. Denies recent antibiotic use.   Sharilyn Sites, MD: PCP   Past Medical History:  Diagnosis Date  . Abnormality of gait 09/26/2013  . Arthritis   . Memory difficulty   . Multiple sclerosis (HCC) 09/26/2013  . Multiple sclerosis (HCC)   . Neurogenic bladder    self caths  . Self-catheterizes urinary bladder   . Urinary frequency     Patient Active Problem List   Diagnosis Date Noted  . Urinary frequency 10/01/2015  . Neurogenic bladder 10/01/2015  . Multiple sclerosis (HCC) 09/26/2013  . Abnormality of gait 09/26/2013    Past Surgical History:  Procedure Laterality Date  . BOTOX INJECTION N/A 12/13/2017   Procedure: BOTOX INJECTION;  Surgeon: Vanna Scotland, MD;  Location: [facility] ORS;  Service: Urology;  Laterality: N/A;  . CYSTOSCOPY N/A 12/13/2017   Procedure: CYSTOSCOPY;  Surgeon:  Vanna Scotland, MD;  Location: [facility] ORS;  Service: Urology;  Laterality: N/A;  . GALLBLADDER SURGERY       No current facility-administered medications for this encounter.   Current Outpatient Medications:  .  baclofen (LIORESAL) 10 MG tablet, Take 10 mg by mouth 2 (two) times daily. , Disp: , Rfl:  .  Cholecalciferol (VITAMIN D-3) 1000 UNITS CAPS, Take 1,000 Units by mouth daily. , Disp: , Rfl:  .  dalfampridine (AMPYRA) 10 MG TB12, Take 10 mg by mouth 2 (two) times daily., Disp: , Rfl:  .  Dimethyl Fumarate (TECFIDERA) 240 MG CPDR, Take 240 mg by mouth 2 (two) times daily., Disp: , Rfl:  .  donepezil (ARICEPT) 10 MG tablet, Take 10 mg by mouth at bedtime. , Disp: , Rfl:  .  fluticasone (FLONASE) 50 MCG/ACT nasal spray, Place 2 sprays into both nostrils daily., Disp: 16 g, Rfl: 0 .  memantine (NAMENDA) 10 MG tablet, Take 10 mg by mouth 2 (two) times daily., Disp: , Rfl:  .  mirabegron ER (MYRBETRIQ) 25 MG TB24 tablet, TAKE 1 TABLET (25 MG TOTAL) BY MOUTH 2  TIMES DAILY. (Patient taking differently: Take 25 mg by mouth daily. ), Disp: 180 tablet, Rfl: 3 .  norethindrone-ethinyl estradiol 1/35 (NORTREL 1/35, 28,) tablet, Take 1 tablet by mouth daily. , Disp: , Rfl:  .  oxycodone (OXY-IR) 5 MG capsule, Take 5 mg by mouth every 6 (six) hours. , Disp: , Rfl:  .  pantoprazole (PROTONIX) 40 MG tablet, Take 40 mg by mouth daily., Disp: , Rfl:  .  polyethylene glycol  powder (GLYCOLAX/MIRALAX) powder, Take 17g daily for constipation., Disp: , Rfl:  .  senna (SENOKOT) 8.6 MG tablet, Take 1 tablet by mouth 2 (two) times daily. , Disp: , Rfl:  .  vitamin B-12 (CYANOCOBALAMIN) 1000 MCG tablet, Take 1,000 mcg by mouth daily., Disp: , Rfl:  .  cephALEXin (KEFLEX) 500 MG capsule, Take 1 capsule (500 mg total) by mouth 2 (two) times daily for 7 days., Disp: 14 capsule, Rfl: 0  Allergies Sulfa antibiotics and Tetanus toxoids  Family History  Problem Relation Age of Onset  . Seizures Mother   . Emphysema  Father   . Heart disease Brother   . Kidney disease Neg Hx   . Prostate cancer Neg Hx   . Bladder Cancer Neg Hx     Social History Social History   Tobacco Use  . Smoking status: Never Smoker  . Smokeless tobacco: Never Used  Substance Use Topics  . Alcohol use: Yes    Alcohol/week: 0.0 oz    Comment: drinks everyday 2 beers  . Drug use: No    Review of Systems Constitutional: No fever/chills ENT: No sore throat. Cardiovascular: Denies chest pain. Respiratory: Denies shortness of breath. Gastrointestinal: No abdominal pain.  No nausea, no vomiting.  No diarrhea. Genitourinary: Positive for dysuria. Musculoskeletal: As above.  Skin: Negative for rash.   ____________________________________________   PHYSICAL EXAM:  VITAL SIGNS: ED Triage Vitals  Enc Vitals Group     BP 03/08/18 1023 (!) 118/54     Pulse Rate 03/08/18 1023 90     Resp 03/08/18 1023 20     Temp 03/08/18 1023 98 F (36.7 C)     Temp Source 03/08/18 1023 Oral     SpO2 03/08/18 1023 100 %     Weight 03/08/18 1024 155 lb (70.3 kg)     Height 03/08/18 1024 5\' 5"  (1.651 m)     Head Circumference --      Peak Flow --      Pain Score 03/08/18 1023 0     Pain Loc --      Pain Edu? --      Excl. in GC? --     Constitutional: Alert and oriented. Well appearing and in no acute distress. Cardiovascular: Normal rate, regular rhythm. Grossly normal heart sounds.  Good peripheral circulation. Respiratory: Normal respiratory effort without tachypnea nor retractions. Breath sounds are clear and equal bilaterally. No wheezes, rales, rhonchi. Gastrointestinal: Soft and nontender No CVA tenderness. Musculoskeletal:  No midline cervical, thoracic or lumbar tenderness to palpation.  Neurologic:  Normal speech and language. Skin:  Skin is warm, dry and intact. No rash noted. Psychiatric: Mood and affect are normal. Speech and behavior are normal. Patient exhibits appropriate insight and judgment     ___________________________________________   LABS (all labs ordered are listed, but only abnormal results are displayed)  Labs Reviewed  URINALYSIS, COMPLETE (UACMP) WITH MICROSCOPIC - Abnormal; Notable for the following components:      Result Value   APPearance HAZY (*)    Hgb urine dipstick TRACE (*)    Ketones, ur TRACE (*)    Nitrite POSITIVE (*)    Squamous Epithelial / LPF 0-5 (*)    Bacteria, UA MANY (*)    All other components within normal limits  URINE CULTURE     PROCEDURES Procedures     INITIAL IMPRESSION / ASSESSMENT AND PLAN / ED COURSE  Pertinent labs & imaging results that were available during my care of  the patient were reviewed by me and considered in my medical decision making (see chart for details).  Well-appearing patient.  No acute distress.  Urinalysis reviewed.  Will culture urine.  Treat UTI with oral cephalexin.  Encourage rest, fluids, supportive care, monitoring for triggers and avoiding.  Follow-up with urology and primary care as needed.Discussed indication, risks and benefits of medications with patient.  Discussed follow up with Primary care physician this week. Discussed follow up and return parameters including no resolution or any worsening concerns. Patient verbalized understanding and agreed to plan.   ____________________________________________   FINAL CLINICAL IMPRESSION(S) / ED DIAGNOSES  Final diagnoses:  Urinary tract infection with hematuria, site unspecified     ED Discharge Orders        Ordered    cephALEXin (KEFLEX) 500 MG capsule  2 times daily     03/08/18 1139       Note: This dictation was prepared with Dragon dictation along with smaller phrase technology. Any transcriptional errors that result from this process are unintentional.         Renford Dills, NP 03/08/18 1154

## 2018-03-08 NOTE — ED Triage Notes (Signed)
Patient in today c/o dysuria x today. Patient has MS and Neurogenic bladder. Patient self caths herself.

## 2018-03-10 ENCOUNTER — Telehealth (HOSPITAL_COMMUNITY): Payer: Self-pay | Admitting: Internal Medicine

## 2018-03-10 MED ORDER — CIPROFLOXACIN HCL 500 MG PO TABS: 500.0000 mg | ORAL_TABLET | Freq: Two times a day (BID) | ORAL | 0 refills | Status: AC

## 2018-03-10 NOTE — Telephone Encounter (Signed)
Clinical staff, please let patient know that urine culture was positive for Enterobacter germ, resistant to cephalexin prescription given at the urgent care visit but sensitive to cipro.  Stop cephalexin.  Rx cipro sent to the pharmacy of record, [facility] on [address] in [place].  Recheck or followup with your primary care provider for further evaluation if symptoms are not improving.  LM

## 2018-03-11 LAB — URINE CULTURE: Culture: >=100000 — AB

## 2018-04-27 ENCOUNTER — Ambulatory Visit: Payer: Medicare HMO | Admitting: Urology

## 2018-05-18 ENCOUNTER — Encounter: Payer: Self-pay | Admitting: *Deleted

## 2018-05-22 ENCOUNTER — Ambulatory Visit: Payer: Medicare HMO | Admitting: Anesthesiology

## 2018-05-22 ENCOUNTER — Ambulatory Visit
Admission: RE | Admit: 2018-05-22 | Discharge: 2018-05-22 | Disposition: A | Discharge status: Home / Self Care | Payer: Medicare HMO | Source: Ambulatory Visit | Attending: Gastroenterology | Admitting: Gastroenterology

## 2018-05-22 ENCOUNTER — Encounter
Admission: RE | Disposition: A | Discharge status: Home / Self Care | Payer: Self-pay | Source: Ambulatory Visit | Attending: Gastroenterology

## 2018-05-22 ENCOUNTER — Encounter: Payer: Self-pay | Admitting: *Deleted

## 2018-05-22 DIAGNOSIS — M1990 Unspecified osteoarthritis, unspecified site: Secondary | ICD-10-CM | POA: Insufficient documentation

## 2018-05-22 DIAGNOSIS — Z881 Allergy status to other antibiotic agents status: Secondary | ICD-10-CM | POA: Diagnosis not present

## 2018-05-22 DIAGNOSIS — Z79899 Other long term (current) drug therapy: Secondary | ICD-10-CM | POA: Diagnosis not present

## 2018-05-22 DIAGNOSIS — K6389 Other specified diseases of intestine: Secondary | ICD-10-CM | POA: Diagnosis not present

## 2018-05-22 DIAGNOSIS — R413 Other amnesia: Secondary | ICD-10-CM | POA: Insufficient documentation

## 2018-05-22 DIAGNOSIS — Z888 Allergy status to other drugs, medicaments and biological substances status: Secondary | ICD-10-CM | POA: Insufficient documentation

## 2018-05-22 DIAGNOSIS — K5730 Diverticulosis of large intestine without perforation or abscess without bleeding: Secondary | ICD-10-CM | POA: Insufficient documentation

## 2018-05-22 DIAGNOSIS — Z882 Allergy status to sulfonamides status: Secondary | ICD-10-CM | POA: Insufficient documentation

## 2018-05-22 DIAGNOSIS — R194 Change in bowel habit: Secondary | ICD-10-CM | POA: Insufficient documentation

## 2018-05-22 DIAGNOSIS — G35 Multiple sclerosis: Secondary | ICD-10-CM | POA: Diagnosis not present

## 2018-05-22 HISTORY — DX: Duodenal ulcer, unspecified as acute or chronic, without hemorrhage or perforation: K26.9

## 2018-05-22 HISTORY — PX: COLONOSCOPY WITH PROPOFOL: SHX5780

## 2018-05-22 LAB — POCT PREGNANCY, URINE: Preg Test, Ur: NEGATIVE

## 2018-05-22 SURGERY — COLONOSCOPY WITH PROPOFOL
Anesthesia: General

## 2018-05-22 MED ORDER — LIDOCAINE HCL (CARDIAC) PF 100 MG/5ML IV SOSY
PREFILLED_SYRINGE | INTRAVENOUS | Status: DC | PRN
  Administered 2018-05-22: 80 mg via INTRAVENOUS

## 2018-05-22 MED ORDER — SODIUM CHLORIDE 0.9 % IV SOLN
INTRAVENOUS | Status: DC
  Administered 2018-05-22: 08:00:00 via INTRAVENOUS

## 2018-05-22 MED ORDER — LIDOCAINE HCL (PF) 1 % IJ SOLN
INTRAMUSCULAR | Status: AC
  Filled 2018-05-22: qty 2

## 2018-05-22 MED ORDER — PROPOFOL 10 MG/ML IV BOLUS
INTRAVENOUS | Status: DC | PRN
  Administered 2018-05-22: 50 mg via INTRAVENOUS
  Administered 2018-05-22: 100 mg via INTRAVENOUS
  Administered 2018-05-22: 80 mg via INTRAVENOUS

## 2018-05-22 MED ORDER — PROPOFOL 10 MG/ML IV BOLUS
INTRAVENOUS | Status: AC
  Filled 2018-05-22: qty 40

## 2018-05-22 MED ORDER — SODIUM CHLORIDE 0.9 % IV SOLN: INTRAVENOUS | Status: DC

## 2018-05-22 MED ORDER — PROPOFOL 500 MG/50ML IV EMUL
INTRAVENOUS | Status: DC | PRN
  Administered 2018-05-22: 100 ug/kg/min via INTRAVENOUS

## 2018-05-22 NOTE — Anesthesia Post-op Follow-up Note (Signed)
Anesthesia QCDR form completed.        

## 2018-05-22 NOTE — Anesthesia Postprocedure Evaluation (Signed)
Anesthesia Post Note  Patient: Adrienne Strickland  Procedure(s) Performed: COLONOSCOPY WITH PROPOFOL (N/A )  Patient location during evaluation: Endoscopy Anesthesia Type: General Level of consciousness: awake and alert Pain management: pain level controlled Vital Signs Assessment: post-procedure vital signs reviewed and stable Respiratory status: spontaneous breathing, nonlabored ventilation, respiratory function stable and patient connected to nasal cannula oxygen Cardiovascular status: blood pressure returned to baseline and stable Postop Assessment: no apparent nausea or vomiting Anesthetic complications: no     Last Vitals:  Vitals:   05/22/18 0920 05/22/18 0930  BP: 118/65 131/66  Pulse: 74 75  Resp: 15 13  Temp:    SpO2: 100% 100%    Last Pain:  Vitals:   05/22/18 0900  TempSrc: Tympanic  PainSc:                  Itzae Miralles S

## 2018-05-22 NOTE — Anesthesia Preprocedure Evaluation (Signed)
Anesthesia Evaluation  Patient identified by MRN, date of birth, ID band Patient awake    Reviewed: Allergy & Precautions, NPO status , Patient's Chart, lab work & pertinent test results, reviewed documented beta blocker date and time   Airway Mallampati: II  TM Distance: >3 FB     Dental  (+) Chipped, Missing   Pulmonary           Cardiovascular      Neuro/Psych    GI/Hepatic PUD,   Endo/Other    Renal/GU      Musculoskeletal   Abdominal   Peds  Hematology   Anesthesia Other Findings MS.  Reproductive/Obstetrics                             Anesthesia Physical Anesthesia Plan  ASA: III  Anesthesia Plan: General   Post-op Pain Management:    Induction: Intravenous  PONV Risk Score and Plan:   Airway Management Planned:   Additional Equipment:   Intra-op Plan:   Post-operative Plan:   Informed Consent: I have reviewed the patients History and Physical, chart, labs and discussed the procedure including the risks, benefits and alternatives for the proposed anesthesia with the patient or authorized representative who has indicated his/her understanding and acceptance.     Plan Discussed with: CRNA  Anesthesia Plan Comments:         Anesthesia Quick Evaluation

## 2018-05-22 NOTE — H&P (Signed)
Outpatient short stay form Pre-procedure 05/22/2018 8:11 AM Adrienne Deem MD  Primary Physician: Sedalia Muta, NP  Reason for visit: Colonoscopy  History of present illness: Patient is a 57 year old female presenting today as above.  He has had some changes in bowel habits with alternating diarrhea and constipation.  She does not have any healing of incomplete defecation.  She does have a neurogenic bladder secondary to her multiple sclerosis.  She did tolerate her prep well.  She takes no aspirin or blood thinning agent.    Current Facility-Administered Medications:  .  0.9 %  sodium chloride infusion, , Intravenous, Continuous, Adrienne Deem, MD .  0.9 %  sodium chloride infusion, , Intravenous, Continuous, Adrienne Deem, MD .  lidocaine (PF) (XYLOCAINE) 1 % injection, , , ,   Medications Prior to Admission  Medication Sig Dispense Refill Last Dose  . baclofen (LIORESAL) 10 MG tablet Take 10 mg by mouth 2 (two) times daily.    05/21/2018 at Unknown time  . Cholecalciferol (VITAMIN D-3) 1000 UNITS CAPS Take 1,000 Units by mouth daily.    Past Week at Unknown time  . dalfampridine (AMPYRA) 10 MG TB12 Take 10 mg by mouth 2 (two) times daily.   05/21/2018 at Unknown time  . Dimethyl Fumarate (TECFIDERA) 240 MG CPDR Take 240 mg by mouth 2 (two) times daily.   05/21/2018 at Unknown time  . donepezil (ARICEPT) 10 MG tablet Take 10 mg by mouth at bedtime.    05/21/2018 at Unknown time  . memantine (NAMENDA) 10 MG tablet Take 10 mg by mouth 2 (two) times daily.   05/21/2018 at Unknown time  . mirabegron ER (MYRBETRIQ) 25 MG TB24 tablet TAKE 1 TABLET (25 MG TOTAL) BY MOUTH 2  TIMES DAILY. (Patient taking differently: Take 25 mg by mouth daily. ) 180 tablet 3 05/21/2018 at Unknown time  . norethindrone-ethinyl estradiol 1/35 (NORTREL 1/35, 28,) tablet Take 1 tablet by mouth daily.    05/21/2018 at Unknown time  . oxycodone (OXY-IR) 5 MG capsule Take 5 mg by mouth every 6 (six) hours.     05/22/2018 at 0600  . pantoprazole (PROTONIX) 40 MG tablet Take 40 mg by mouth daily.   05/21/2018 at Unknown time  . polyethylene glycol powder (GLYCOLAX/MIRALAX) powder Take 17g daily for constipation.   05/21/2018 at Unknown time  . senna (SENOKOT) 8.6 MG tablet Take 1 tablet by mouth 2 (two) times daily.    Past Week at Unknown time  . vitamin B-12 (CYANOCOBALAMIN) 1000 MCG tablet Take 1,000 mcg by mouth daily.   Past Week at Unknown time  . fluticasone (FLONASE) 50 MCG/ACT nasal spray Place 2 sprays into both nostrils daily. 16 g 0 03/08/2018 at Unknown time  . fluticasone (FLOVENT DISKUS) 50 MCG/BLIST diskus inhaler Inhale 1 puff into the lungs 2 (two) times daily.   Not Taking at Unknown time     Allergies  Allergen Reactions  . Sulfa Antibiotics Nausea And Vomiting  . Tetanus Toxoids Swelling    Extreme swelling at injection site     Past Medical History:  Diagnosis Date  . Abnormality of gait 09/26/2013  . Arthritis   . Duodenal ulcer 02/27/2015  . Memory difficulty   . Multiple sclerosis (HCC) 09/26/2013  . Multiple sclerosis (HCC)   . Neurogenic bladder    self caths  . Self-catheterizes urinary bladder   . Urinary frequency     Review of systems:      Physical Exam  Heart and lungs: Without rub or gallop, lungs are bilaterally clear.    HEENT: Normocephalic atraumatic eyes are anicteric    Other:    Pertinant exam for procedure: Soft nontender nondistended bowel sounds positive normoactive    Planned proceedures: Colonoscopy and indicated procedures. I have discussed the risks benefits and complications of procedures to include not limited to bleeding, infection, perforation and the risk of sedation and the patient wishes to proceed.    Adrienne Deem, MD Gastroenterology 05/22/2018  8:11 AM

## 2018-05-22 NOTE — Op Note (Addendum)
[facility] Gastroenterology Patient Name: Adrienne Strickland Procedure Date: 05/22/2018 8:01 AM MRN: [id number] Account #: [id number] Date of Birth: [birth year] Admit Type: Outpatient Age: 57 Room: [facility] ENDO ROOM 2 Gender: Female Note Status: Finalized Procedure:            Colonoscopy Indications:          Change in bowel habits, Diarrhea Providers:            Christena Deem, MD Referring MD:         Margaretmary Eddy. Heflin MD, MD (Referring MD) Medicines:            Monitored Anesthesia Care Complications:        No immediate complications. Procedure:            Pre-Anesthesia Assessment:                       - ASA Grade Assessment: III - A patient with severe                        systemic disease.                       After obtaining informed consent, the colonoscope was                        passed under direct vision. Throughout the procedure,                        the patient's blood pressure, pulse, and oxygen                        saturations were monitored continuously. The Olympus                        PCF-H180AL colonoscope ( S#: [id number] ) was introduced                        through the anus and advanced to the the cecum,                        identified by appendiceal orifice and ileocecal valve.                        The patient tolerated the procedure well. The quality                        of the bowel preparation was good. Findings:      Multiple medium-mouthed diverticula were found in the sigmoid colon,       descending colon, transverse colon and ascending colon.      Biopsies for histology were taken with a cold forceps from the right       colon and left colon for evaluation of microscopic colitis.      A diffuse area of mild melanosis was found in the entire colon. Biopsies       were taken with a cold forceps for histology.      The digital rectal exam was normal.      The retroflexed view of the distal rectum and anal verge was  normal and       showed no anal or rectal  abnormalities. Impression:           - Diverticulosis in the sigmoid colon, in the                        descending colon, in the transverse colon and in the                        ascending colon.                       - Melanosis in the colon. Biopsied.                       - The distal rectum and anal verge are normal on                        retroflexion view.                       - Biopsies were taken with a cold forceps from the                        right colon and left colon for evaluation of                        microscopic colitis. Recommendation:       - Discharge patient to home.                       - Use Citrucel one tablespoon PO daily daily. Procedure Code(s):    --- Professional ---                       [phone], Colonoscopy, flexible; with biopsy, single or                        multiple Diagnosis Code(s):    --- Professional ---                       K63.89, Other specified diseases of intestine                       R19.4, Change in bowel habit                       R19.7, Diarrhea, unspecified                       K57.30, Diverticulosis of large intestine without                        perforation or abscess without bleeding CPT copyright 2017 American Medical Association. All rights reserved. The codes documented in this report are preliminary and upon coder review may  be revised to meet current compliance requirements. Christena Deem, MD 05/22/2018 9:02:58 AM This report has been signed electronically. Number of Addenda: 0 Note Initiated On: 05/22/2018 8:01 AM Scope Withdrawal Time: 0 hours 10 minutes 42 seconds  Total Procedure Duration: 0 hours 34 minutes 1 second       [facility]

## 2018-05-22 NOTE — Transfer of Care (Signed)
Immediate Anesthesia Transfer of Care Note  Patient: Adrienne Strickland  Procedure(s) Performed: COLONOSCOPY WITH PROPOFOL (N/A )  Patient Location: PACU  Anesthesia Type:General  Level of Consciousness: awake and oriented  Airway & Oxygen Therapy: Patient Spontanous Breathing and Patient connected to nasal cannula oxygen  Post-op Assessment: Report given to RN  Post vital signs: Reviewed and stable  Last Vitals:  Vitals Value Taken Time  BP 114/59 05/22/2018  9:05 AM  Temp    Pulse 80 05/22/2018  9:05 AM  Resp 18 05/22/2018  9:05 AM  SpO2 100 % 05/22/2018  9:05 AM  Vitals shown include unvalidated device data.  Last Pain:  Vitals:   05/22/18 0745  PainSc: 0-No pain         Complications: No apparent anesthesia complications

## 2018-05-23 ENCOUNTER — Encounter: Payer: Self-pay | Admitting: Gastroenterology

## 2018-05-23 LAB — SURGICAL PATHOLOGY

## 2018-10-11 ENCOUNTER — Other Ambulatory Visit: Payer: Self-pay | Admitting: Neurology

## 2018-10-11 DIAGNOSIS — G35 Multiple sclerosis: Secondary | ICD-10-CM

## 2018-10-31 ENCOUNTER — Ambulatory Visit: Payer: Medicare HMO

## 2018-10-31 ENCOUNTER — Other Ambulatory Visit: Payer: Medicare HMO

## 2018-11-06 ENCOUNTER — Ambulatory Visit
Admission: RE | Admit: 2018-11-06 | Discharge: 2018-11-06 | Disposition: A | Discharge status: Home / Self Care | Payer: Medicare HMO | Source: Ambulatory Visit | Attending: Neurology | Admitting: Neurology

## 2018-11-06 DIAGNOSIS — G35 Multiple sclerosis: Secondary | ICD-10-CM | POA: Diagnosis not present

## 2018-11-06 DIAGNOSIS — M50321 Other cervical disc degeneration at C4-C5 level: Secondary | ICD-10-CM | POA: Insufficient documentation

## 2018-11-06 MED ORDER — GADOBUTROL 1 MMOL/ML IV SOLN
7.0000 mL | Freq: Once | INTRAVENOUS | Status: AC | PRN
  Administered 2018-11-06: 7 mL via INTRAVENOUS

## 2018-12-07 ENCOUNTER — Other Ambulatory Visit: Payer: Self-pay

## 2018-12-07 ENCOUNTER — Encounter: Payer: Self-pay | Admitting: Emergency Medicine

## 2018-12-07 ENCOUNTER — Ambulatory Visit
Admission: EM | Admit: 2018-12-07 | Discharge: 2018-12-07 | Disposition: A | Discharge status: Home / Self Care | Payer: Medicare HMO | Attending: Family Medicine | Admitting: Family Medicine

## 2018-12-07 DIAGNOSIS — M5431 Sciatica, right side: Secondary | ICD-10-CM | POA: Diagnosis not present

## 2018-12-07 MED ORDER — PREDNISONE 20 MG PO TABS: ORAL_TABLET | ORAL | 0 refills | Status: DC

## 2018-12-07 NOTE — ED Triage Notes (Signed)
Patient c/o right hip and right leg pain that started yesterday.  Patient denies injury or fall.

## 2018-12-07 NOTE — ED Provider Notes (Signed)
[facility]    CSN: [id number] Arrival date & time: 12/07/18  1884     History   Chief Complaint Chief Complaint  Patient presents with  . Hip Pain    right  . Leg Pain    right    HPI Adrienne Strickland is a 57 y.o. female.   57 yo female with a c/o right sided low back, buttock and hip pain radiating down the leg since yesterday. States feels like a sharp shooting pain. Denies any injuries, falls, fevers, chills, rash, bowel or bladder problems, numbness/tingling.   The history is provided by the patient.  Hip Pain   Leg Pain    Past Medical History:  Diagnosis Date  . Abnormality of gait 09/26/2013  . Arthritis   . Duodenal ulcer 02/27/2015  . Memory difficulty   . Multiple sclerosis (HCC) 09/26/2013  . Multiple sclerosis (HCC)   . Neurogenic bladder    self caths  . Self-catheterizes urinary bladder   . Urinary frequency     Patient Active Problem List   Diagnosis Date Noted  . Urinary frequency 10/01/2015  . Neurogenic bladder 10/01/2015  . Multiple sclerosis (HCC) 09/26/2013  . Abnormality of gait 09/26/2013    Past Surgical History:  Procedure Laterality Date  . BOTOX INJECTION N/A 12/13/2017   Procedure: BOTOX INJECTION;  Surgeon: Vanna Scotland, MD;  Location: [facility] ORS;  Service: Urology;  Laterality: N/A;  . CHOLECYSTECTOMY    . COLONOSCOPY WITH PROPOFOL N/A 05/22/2018   Procedure: COLONOSCOPY WITH PROPOFOL;  Surgeon: Christena Deem, MD;  Location: [facility] ENDOSCOPY;  Service: Endoscopy;  Laterality: N/A;  . CYSTOSCOPY N/A 12/13/2017   Procedure: CYSTOSCOPY;  Surgeon: Vanna Scotland, MD;  Location: [facility] ORS;  Service: Urology;  Laterality: N/A;  . GALLBLADDER SURGERY      OB History   No obstetric history on file.      Home Medications    Prior to Admission medications   Medication Sig Start Date End Date Taking? Authorizing Provider  baclofen (LIORESAL) 10 MG tablet Take 10 mg by mouth 2 (two) times daily.    Yes [provider]  Cholecalciferol (VITAMIN D-3) 1000 UNITS CAPS Take 1,000 Units by mouth daily.    Yes [provider]  dalfampridine (AMPYRA) 10 MG TB12 Take 10 mg by mouth 2 (two) times daily.   Yes [provider]  Dimethyl Fumarate (TECFIDERA) 240 MG CPDR Take 240 mg by mouth 2 (two) times daily.   Yes [provider]  donepezil (ARICEPT) 10 MG tablet Take 10 mg by mouth at bedtime.    Yes [provider]  fluticasone (FLONASE) 50 MCG/ACT nasal spray Place 2 sprays into both nostrils daily. 08/08/15  Yes Domenick Gong, MD  fluticasone (FLOVENT DISKUS) 50 MCG/BLIST diskus inhaler Inhale 1 puff into the lungs 2 (two) times daily.   Yes [provider]  memantine (NAMENDA) 10 MG tablet Take 10 mg by mouth 2 (two) times daily.   Yes [provider]  norethindrone-ethinyl estradiol 1/35 (NORTREL 1/35, 28,) tablet Take 1 tablet by mouth daily.  01/21/15  Yes [provider]  oxycodone (OXY-IR) 5 MG capsule Take 5 mg by mouth every 6 (six) hours.    Yes [provider]  pantoprazole (PROTONIX) 40 MG tablet Take 40 mg by mouth daily.   Yes [provider]  polyethylene glycol powder (GLYCOLAX/MIRALAX) powder Take 17g daily for constipation. 09/20/16  Yes [provider]  senna (SENOKOT) 8.6  MG tablet Take 1 tablet by mouth 2 (two) times daily.    Yes [provider]  vitamin B-12 (CYANOCOBALAMIN) 1000 MCG tablet Take 1,000 mcg by mouth daily.   Yes [provider]  mirabegron ER (MYRBETRIQ) 25 MG TB24 tablet TAKE 1 TABLET (25 MG TOTAL) BY MOUTH 2  TIMES DAILY. Patient taking differently: Take 25 mg by mouth daily.  10/27/17   Michiel Cowboy A, PA-C  predniSONE (DELTASONE) 20 MG tablet 3 tabs po qd x 2 days, then 2 tabs po qd x 2 days, then 1 tab po qd x 2 days, then half a tab po qd x 2 days 12/07/18   Payton Mccallum, MD    Family History Family History  Problem Relation Age of Onset  .  Seizures Mother   . Emphysema Father   . Heart disease Brother   . Kidney disease Neg Hx   . Prostate cancer Neg Hx   . Bladder Cancer Neg Hx     Social History Social History   Tobacco Use  . Smoking status: Never Smoker  . Smokeless tobacco: Never Used  Substance Use Topics  . Alcohol use: Yes    Alcohol/week: 0.0 standard drinks    Comment: drinks everyday 2 beers  . Drug use: No     Allergies   Sulfa antibiotics and Tetanus toxoids   Review of Systems Review of Systems   Physical Exam Triage Vital Signs ED Triage Vitals  Enc Vitals Group     BP 12/07/18 0947 133/65     Pulse Rate 12/07/18 0947 74     Resp 12/07/18 0947 14     Temp 12/07/18 0947 97.6 F (36.4 C)     Temp Source 12/07/18 0947 Oral     SpO2 12/07/18 0947 100 %     Weight 12/07/18 0944 160 lb (72.6 kg)     Height 12/07/18 0944 5\' 5"  (1.651 m)     Head Circumference --      Peak Flow --      Pain Score 12/07/18 0944 7     Pain Loc --      Pain Edu? --      Excl. in GC? --    No data found.  Updated Vital Signs BP 133/65 (BP Location: Left Arm)   Pulse 74   Temp 97.6 F (36.4 C) (Oral)   Resp 14   Ht 5\' 5"  (1.651 m)   Wt 72.6 kg   LMP 11/23/2018 (Approximate)   SpO2 100%   BMI 26.63 kg/m   Visual Acuity Right Eye Distance:   Left Eye Distance:   Bilateral Distance:    Right Eye Near:   Left Eye Near:    Bilateral Near:     Physical Exam Vitals signs and nursing note reviewed.  Constitutional:      General: She is not in acute distress.    Appearance: She is well-developed. She is not toxic-appearing or diaphoretic.  Musculoskeletal:        General: Tenderness present.     Right shoulder: She exhibits tenderness (over the right buttock ). She exhibits normal range of motion, no bony tenderness, no swelling, no effusion, no crepitus, no deformity, no laceration, no pain, no spasm and normal strength.     Lumbar back: She exhibits tenderness and spasm. She exhibits normal  range of motion, no bony tenderness, no swelling, no edema, no deformity, no laceration, no pain and normal pulse.  Skin:  General: Skin is warm.     Findings: No rash.  Neurological:     General: No focal deficit present.     Mental Status: She is alert.     Sensory: No sensory deficit.     Motor: No abnormal muscle tone.     Deep Tendon Reflexes: Reflexes are normal and symmetric. Reflexes normal.      UC Treatments / Results  Labs (all labs ordered are listed, but only abnormal results are displayed) Labs Reviewed - No data to display  EKG None  Radiology No results found.  Procedures Procedures (including critical care time)  Medications Ordered in UC Medications - No data to display  Initial Impression / Assessment and Plan / UC Course  I have reviewed the triage vital signs and the nursing notes.  Pertinent labs & imaging results that were available during my care of the patient were reviewed by me and considered in my medical decision making (see chart for details).      Final Clinical Impressions(s) / UC Diagnoses   Final diagnoses:  Sciatica of right side    ED Prescriptions    Medication Sig Dispense Auth. Provider   predniSONE (DELTASONE) 20 MG tablet 3 tabs po qd x 2 days, then 2 tabs po qd x 2 days, then 1 tab po qd x 2 days, then half a tab po qd x 2 days 13 tablet Kearstin Learn, MD     1. diagnosis reviewed with patient 2. rx as per orders above; reviewed possible side effects, interactions, risks and benefits  3. Recommend supportive treatment with rest, ice; continue with home pain medication 4. Follow-up prn if symptoms worsen or don't improve    Controlled Substance Prescriptions [place] Controlled Substance Registry consulted? Not Applicable   Payton Mccallum, MD 12/07/18 [phone]

## 2019-05-28 ENCOUNTER — Telehealth: Payer: Self-pay | Admitting: Urology

## 2019-05-28 NOTE — Telephone Encounter (Signed)
We can have her do a virtual visit.

## 2019-05-28 NOTE — Telephone Encounter (Signed)
Pt hasn't seen Carollee Herter since 11/18.  She said she could get Oxybutnin cheaper that Myrbetriq 25 mg.  That was prescribed 11/18.  Can she do a virtual visit or does she need an office appt?

## 2019-05-29 NOTE — Telephone Encounter (Signed)
Patient scheduled for virtual visit on 05/30/19.

## 2019-05-30 ENCOUNTER — Other Ambulatory Visit: Payer: Self-pay

## 2019-05-30 ENCOUNTER — Telehealth: Payer: Medicare HMO | Admitting: Urology

## 2019-06-04 ENCOUNTER — Other Ambulatory Visit: Payer: Self-pay

## 2019-06-04 ENCOUNTER — Telehealth (INDEPENDENT_AMBULATORY_CARE_PROVIDER_SITE_OTHER): Payer: Medicare HMO | Admitting: Urology

## 2019-06-04 DIAGNOSIS — N319 Neuromuscular dysfunction of bladder, unspecified: Secondary | ICD-10-CM

## 2019-06-04 DIAGNOSIS — R3915 Urgency of urination: Secondary | ICD-10-CM

## 2019-06-04 DIAGNOSIS — R351 Nocturia: Secondary | ICD-10-CM | POA: Diagnosis not present

## 2019-06-04 NOTE — Progress Notes (Signed)
Virtual Visit via Telephone Note  I connected with Adrienne Strickland on 06/04/19 at 10:00 AM EDT by telephone and verified that I am speaking with the correct person using two identifiers.  Location: Patient: Home  Provider: Home   I discussed the limitations, risks, security and privacy concerns of performing an evaluation and management service by telephone and the availability of in person appointments. I also discussed with the patient that there may be a patient responsible charge related to this service. The patient expressed understanding and agreed to proceed.  History of Present Illness: Adrienne Strickland is a 58 year old female with a neurogenic bladder and urinary urgency who is requesting a medication change from Myrbetriq to oxybutynin.    She was last seen in our office on 01/09/2018 after having Botox injected into her bladder on 12/13/2017.  At that time, she was very pleased with the results and indicated that she would like to have the injections again in the future.    She states that she received a letter from her insurance company stating that she could have the prescription for oxybutynin at a lower cost and she would like to speak with [place] first.    At this time, she states that she feels the Botox did not help her as much as she would of hoped for.  She is going through two diapers daily.  She states her incontinence volume is severe.    She is CIC x 3 daily.  She sets her alarm during the night to wake every 4 hours to cath.    Patient denies any gross hematuria, dysuria or suprapubic/flank pain.  Patient denies any fevers, chills, nausea or vomiting.    Observations/Objective: Adrienne Strickland does not sound distressed and answers questions appropriately.    Assessment and Plan:  1. Neurogenic bladder Managed by CIC and Myrbetriq Did not find the Botox injections did not help her reach goal - she does not want to have another injection  2. Urinary urgency Discussed the side  effects of the oxybutynin and patient would like to continue the Myrbetriq 25 mg daily  2. Nocturia Will check sodium level - if normal will start desmopressin for the nocturia    Follow Up Instructions:  Adrienne Strickland will have her sodium level checked sometime this week.  If it is in normal parameters, she may pick up Nocdurna samples for a trial.    I discussed the assessment and treatment plan with the patient. The patient was provided an opportunity to ask questions and all were answered. The patient agreed with the plan and demonstrated an understanding of the instructions.   The patient was advised to call back or seek an in-person evaluation if the symptoms worsen or if the condition fails to improve as anticipated.  I provided 30 minutes of non-face-to-face time during this encounter.   Garik Diamant, PA-C

## 2019-08-21 ENCOUNTER — Telehealth: Payer: Self-pay | Admitting: Urology

## 2019-08-21 NOTE — Telephone Encounter (Signed)
Pt called and asked for a call back to discuss getting a permanent cath. Please advise.

## 2019-08-23 NOTE — Telephone Encounter (Signed)
Appt made. Pt notified.

## 2019-08-23 NOTE — Telephone Encounter (Signed)
Mrs. Erny will need an office visit or virtual office visit to discuss this further either with me or Dr. Erlene Quan.

## 2019-09-04 ENCOUNTER — Ambulatory Visit: Payer: Medicare HMO | Admitting: Urology

## 2019-09-07 NOTE — Progress Notes (Signed)
09/09/2019 3:29 PM   Adrienne Strickland Feb 07, 1961 [id number]  Referring provider: Marygrace Drought, MD [address] [facility] [place],  [place] [address]  Chief Complaint  Patient presents with  . Nerogenic bladder    HPI: Adrienne Strickland is a 58 year old female with a neurogenic bladder and urinary urgency who is requesting a chronic Foley.  She had Botox injected into her bladder on 12/13/2017.  She states that she feels the Botox did not help her as much as she would of hoped for.  She is going through two diapers daily.  She states her incontinence volume is severe.    She is CIC x 3 daily.  She sets her alarm during the night to wake every 4 hours to cath.    She is becoming exhausted and less dexterous regarding her self cathing.  She states when she caths herself, the urine is sprayed all over the floor.  She then has to clean herself and the area that is soiled with urine.  As one can imagine, she is worn out for several hours after the incidence.    Patient denies any gross hematuria, dysuria or suprapubic/flank pain.  Patient denies any fevers, chills, nausea or vomiting.     PMH: Past Medical History:  Diagnosis Date  . Abnormality of gait 09/26/2013  . Arthritis   . Duodenal ulcer 02/27/2015  . Memory difficulty   . Multiple sclerosis ([place]) 09/26/2013  . Multiple sclerosis ([place])   . Neurogenic bladder    self caths  . Self-catheterizes urinary bladder   . Urinary frequency     Surgical History: Past Surgical History:  Procedure Laterality Date  . BOTOX INJECTION N/A 12/13/2017   Procedure: BOTOX INJECTION;  Surgeon: Hollice Espy, MD;  Location: [facility] ORS;  Service: Urology;  Laterality: N/A;  . CHOLECYSTECTOMY    . COLONOSCOPY WITH PROPOFOL N/A 05/22/2018   Procedure: COLONOSCOPY WITH PROPOFOL;  Surgeon: Lollie Sails, MD;  Location: [facility] ENDOSCOPY;  Service: Endoscopy;  Laterality: N/A;  . CYSTOSCOPY N/A 12/13/2017   Procedure:  CYSTOSCOPY;  Surgeon: Hollice Espy, MD;  Location: [facility] ORS;  Service: Urology;  Laterality: N/A;  . GALLBLADDER SURGERY      Home Medications:  Allergies as of 09/09/2019      Reactions   Sulfa Antibiotics Nausea And Vomiting   Tetanus Toxoids Swelling   Extreme swelling at injection site      Medication List       Accurate as of September 09, 2019  3:29 PM. If you have any questions, ask your nurse or doctor.        Ampyra 10 MG Tb12 Generic drug: dalfampridine Take 10 mg by mouth 2 (two) times daily.   baclofen 10 MG tablet Commonly known as: LIORESAL Take 10 mg by mouth 2 (two) times daily.   donepezil 10 MG tablet Commonly known as: ARICEPT Take 10 mg by mouth at bedtime.   fluticasone 50 MCG/ACT nasal spray Commonly known as: FLONASE Place 2 sprays into both nostrils daily.   fluticasone 50 MCG/BLIST diskus inhaler Commonly known as: FLOVENT DISKUS Inhale 1 puff into the lungs 2 (two) times daily.   memantine 10 MG tablet Commonly known as: NAMENDA Take 10 mg by mouth 2 (two) times daily.   mirabegron ER 25 MG Tb24 tablet Commonly known as: Myrbetriq TAKE 1 TABLET (25 MG TOTAL) BY MOUTH 2  TIMES DAILY. What changed:   how much to take  how  to take this  when to take this  additional instructions   Nortrel 1/35 (28) tablet Generic drug: norethindrone-ethinyl estradiol 1/35 Take 1 tablet by mouth daily.   oxycodone 5 MG capsule Commonly known as: OXY-IR Take 5 mg by mouth every 6 (six) hours.   pantoprazole 40 MG tablet Commonly known as: PROTONIX Take 40 mg by mouth daily.   polyethylene glycol powder 17 GM/SCOOP powder Commonly known as: GLYCOLAX/MIRALAX Take 17g daily for constipation.   predniSONE 20 MG tablet Commonly known as: DELTASONE 3 tabs po qd x 2 days, then 2 tabs po qd x 2 days, then 1 tab po qd x 2 days, then half a tab po qd x 2 days   senna 8.6 MG tablet Commonly known as: SENOKOT Take 1 tablet by mouth 2 (two) times  daily.   Tecfidera 240 MG Cpdr Generic drug: Dimethyl Fumarate Take 240 mg by mouth 2 (two) times daily.   vitamin B-12 1000 MCG tablet Commonly known as: CYANOCOBALAMIN Take 1,000 mcg by mouth daily.   Vitamin D-3 25 MCG (1000 UT) Caps Take 1,000 Units by mouth daily.       Allergies:  Allergies  Allergen Reactions  . Sulfa Antibiotics Nausea And Vomiting  . Tetanus Toxoids Swelling    Extreme swelling at injection site    Family History: Family History  Problem Relation Age of Onset  . Seizures Mother   . Emphysema Father   . Heart disease Brother   . Kidney disease Neg Hx   . Prostate cancer Neg Hx   . Bladder Cancer Neg Hx     Social History:  reports that she has never smoked. She has never used smokeless tobacco. She reports current alcohol use. She reports that she does not use drugs.  ROS: UROLOGY Frequent Urination?: No Hard to postpone urination?: No Burning/pain with urination?: No Get up at night to urinate?: No Leakage of urine?: No Urine stream starts and stops?: No Trouble starting stream?: No Do you have to strain to urinate?: No Blood in urine?: No Urinary tract infection?: No Sexually transmitted disease?: No Injury to kidneys or bladder?: No Painful intercourse?: No Weak stream?: No Currently pregnant?: No Vaginal bleeding?: No Last menstrual period?: n  Gastrointestinal Nausea?: No Vomiting?: No Indigestion/heartburn?: No Diarrhea?: No Constipation?: No  Constitutional Fever: No Night sweats?: No Weight loss?: No Fatigue?: No  Skin Skin rash/lesions?: No Itching?: No  Eyes Blurred vision?: No Double vision?: No  Ears/Nose/Throat Sore throat?: No Sinus problems?: No  Hematologic/Lymphatic Swollen glands?: No Easy bruising?: No  Cardiovascular Leg swelling?: No Chest pain?: No  Respiratory Cough?: No Shortness of breath?: No  Endocrine Excessive thirst?: No  Musculoskeletal Back pain?: No Joint pain?:  No  Neurological Headaches?: No Dizziness?: No  Psychologic Depression?: No Anxiety?: No  Physical Exam: BP 135/66 (BP Location: Left Arm, Patient Position: Sitting, Cuff Size: Normal)   Pulse 71   Ht 5\' 5"  (1.651 m)   Wt 183 lb (83 kg)   BMI 30.45 kg/m   Constitutional:  Well nourished. Alert and oriented, No acute distress. HEENT: [place] AT, moist mucus membranes.  Trachea midline, no masses. Cardiovascular: No clubbing, cyanosis, or edema. Respiratory: Normal respiratory effort, no increased work of breathing. Neurologic: Grossly intact, no focal deficits, moving all 4 extremities. Psychiatric: Normal mood and affect.  Laboratory Data: No results found for: WBC, HGB, HCT, MCV, PLT  No results found for: CREATININE  No results found for: PSA  No results found  for: TESTOSTERONE  No results found for: HGBA1C  No results found for: TSH  No results found for: CHOL, HDL, CHOLHDL, VLDL, LDLCALC  No results found for: AST No results found for: ALT No components found for: ALKALINEPHOPHATASE No components found for: BILIRUBINTOTAL  No results found for: ESTRADIOL  Urinalysis    Component Value Date/Time   COLORURINE YELLOW 03/08/2018 1036   APPEARANCEUR HAZY (A) 03/08/2018 1036   APPEARANCEUR Cloudy (A) 11/10/2017 0851   LABSPEC 1.015 03/08/2018 1036   PHURINE 6.5 03/08/2018 1036   GLUCOSEU NEGATIVE 03/08/2018 1036   HGBUR TRACE (A) 03/08/2018 1036   BILIRUBINUR NEGATIVE 03/08/2018 1036   BILIRUBINUR Negative 11/10/2017 0851   KETONESUR TRACE (A) 03/08/2018 1036   PROTEINUR NEGATIVE 03/08/2018 1036   NITRITE POSITIVE (A) 03/08/2018 1036   LEUKOCYTESUR NEGATIVE 03/08/2018 1036   LEUKOCYTESUR Negative 11/10/2017 0851    I have reviewed the labs.  Simple Catheter Placement Patient was cleaned and prepped in a sterile fashion with betadine and lidocaine jelly 2% was instilled into the urethra.  A 16 FR foley catheter was inserted, urine return was noted  50 ml,  urine was yellow in color.  The balloon was filled with 10cc of sterile water.  A leg bag was attached for drainage. Patient was also given a night bag to take home and was given instruction on how to change from one bag to another.  Patient was given instruction on proper catheter care.  Patient tolerated well, no complications were noted   Performed by: Michiel CowboyShannon Jahrell Hamor, PA-C and Honor Loharrie M Garrison, CMA   Assessment & Plan:    1. Neurogenic bladder We discussed the risks of having an indwelling Foley, rUTI's with a strong possibility of becoming septic which is life threatening, colonization of the bladder with bacteria which can lead to the over prescribing of antibiotics and sediment production causes tube dysfunction requiring catheter exchanges and the need for irrigation and the possibility of developing bladder cancer and the need for cystoscopy and upper tract imaging every 5 years.  She is understands these risks, but she states she has no quality of life at this time.  She would rather take the risks of having the indwelling Foley vs continuing to cath herself. She will return in one month for catheter exchange.  If she tolerates the Foley, we may discuss switching to a SPT.  2. Urinary urgency Continue the Myrbetriq 25 mg daily for spasms  3. Nocturia Foley in place   Return in about 1 month (around 10/09/2019) for Foley exchange .  These notes generated with voice recognition software. I apologize for typographical errors.  Michiel CowboySHANNON Zamyiah Tino, PA-C  [facility] [address]  [address] [place], [place] [id number] [phone]

## 2019-09-09 ENCOUNTER — Encounter: Payer: Self-pay | Admitting: Urology

## 2019-09-09 ENCOUNTER — Ambulatory Visit (INDEPENDENT_AMBULATORY_CARE_PROVIDER_SITE_OTHER): Payer: Medicare HMO | Admitting: Urology

## 2019-09-09 ENCOUNTER — Other Ambulatory Visit: Payer: Self-pay

## 2019-09-09 VITALS — BP 135/66 | HR 71 | Ht 65.0 in | Wt 183.0 lb

## 2019-09-09 DIAGNOSIS — N319 Neuromuscular dysfunction of bladder, unspecified: Secondary | ICD-10-CM | POA: Diagnosis not present

## 2019-09-09 DIAGNOSIS — R3915 Urgency of urination: Secondary | ICD-10-CM

## 2019-09-09 DIAGNOSIS — R351 Nocturia: Secondary | ICD-10-CM | POA: Diagnosis not present

## 2019-09-12 ENCOUNTER — Telehealth: Payer: Self-pay | Admitting: Urology

## 2019-09-12 NOTE — Telephone Encounter (Signed)
Pt. Needs someone to call her and go over [facility] for her new catheter. She has a few questions.

## 2019-09-12 NOTE — Telephone Encounter (Signed)
Spoke to patient and she needed to go over emptying her leg and night bag again. Process was explained in detail and patient performed it on the phone. Patient understood how to do them. Informed patient if she has anymore question to call our office.

## 2019-10-07 ENCOUNTER — Ambulatory Visit: Payer: Medicare HMO | Admitting: Urology

## 2019-10-07 NOTE — Progress Notes (Signed)
10/08/2019 1:22 PM   Adrienne Strickland 1961/02/21 [id number]  Referring provider: Marygrace Drought, MD [address] [facility] [place],  [place] [address]  Chief Complaint  Patient presents with  . Catheter exchange    HPI: Adrienne Strickland is a 58 year old female with a neurogenic bladder and urinary urgency who has an indwelling chronic Foley.  She had Botox injected into her bladder on 12/13/2017.  She stated that she feels the Botox did not help her as much as she would of hoped for.  She was going through two diapers daily.  She stated her incontinence volume was severe.    She was CIC x 3 daily.  She set her alarm during the night to wake every 4 hours to cath.    She was becoming exhausted and less dexterous regarding her self cathing.  She stated when she caths herself, the urine is sprayed all over the floor.  She then had to clean herself and the area that is soiled with urine.  As one can imagine, she is worn out for several hours after the incidence.    A Foley catheter was placed on 09/09/2019.  Today, she states that she is relieved by having the Foley in place.  She has not had any major issues with the catheter.  It has allowed her to have a more normal life.  She is sleeping through the night and feeling much more rested. Patient denies any gross hematuria, dysuria or suprapubic/flank pain.  Patient denies any fevers, chills, nausea or vomiting.      PMH: Past Medical History:  Diagnosis Date  . Abnormality of gait 09/26/2013  . Arthritis   . Duodenal ulcer 02/27/2015  . Memory difficulty   . Multiple sclerosis ([place]) 09/26/2013  . Multiple sclerosis ([place])   . Neurogenic bladder    self caths  . Self-catheterizes urinary bladder   . Urinary frequency     Surgical History: Past Surgical History:  Procedure Laterality Date  . BOTOX INJECTION N/A 12/13/2017   Procedure: BOTOX INJECTION;  Surgeon: Hollice Espy, MD;  Location: [facility] ORS;  Service:  Urology;  Laterality: N/A;  . CHOLECYSTECTOMY    . COLONOSCOPY WITH PROPOFOL N/A 05/22/2018   Procedure: COLONOSCOPY WITH PROPOFOL;  Surgeon: Lollie Sails, MD;  Location: [facility] ENDOSCOPY;  Service: Endoscopy;  Laterality: N/A;  . CYSTOSCOPY N/A 12/13/2017   Procedure: CYSTOSCOPY;  Surgeon: Hollice Espy, MD;  Location: [facility] ORS;  Service: Urology;  Laterality: N/A;  . GALLBLADDER SURGERY      Home Medications:  Allergies as of 10/08/2019      Reactions   Sulfa Antibiotics Nausea And Vomiting   Tetanus Toxoids Swelling   Extreme swelling at injection site      Medication List       Accurate as of October 08, 2019  1:22 PM. If you have any questions, ask your nurse or doctor.        STOP taking these medications   predniSONE 20 MG tablet Commonly known as: DELTASONE Stopped by: Merideth Bosque, PA-C     TAKE these medications   Ampyra 10 MG Tb12 Generic drug: dalfampridine Take 10 mg by mouth 2 (two) times daily.   baclofen 10 MG tablet Commonly known as: LIORESAL Take 10 mg by mouth 2 (two) times daily.   donepezil 10 MG tablet Commonly known as: ARICEPT Take 10 mg by mouth at bedtime.   fluticasone 50 MCG/ACT nasal spray Commonly known  as: FLONASE Place 2 sprays into both nostrils daily.   fluticasone 50 MCG/BLIST diskus inhaler Commonly known as: FLOVENT DISKUS Inhale 1 puff into the lungs 2 (two) times daily.   memantine 10 MG tablet Commonly known as: NAMENDA Take 10 mg by mouth 2 (two) times daily.   mirabegron ER 25 MG Tb24 tablet Commonly known as: Myrbetriq TAKE 1 TABLET (25 MG TOTAL) BY MOUTH 2  TIMES DAILY. What changed:   how much to take  how to take this  when to take this  additional instructions   Nortrel 1/35 (28) tablet Generic drug: norethindrone-ethinyl estradiol 1/35 Take 1 tablet by mouth daily.   oxycodone 5 MG capsule Commonly known as: OXY-IR Take 5 mg by mouth every 6 (six) hours.   pantoprazole 40 MG tablet  Commonly known as: PROTONIX Take 40 mg by mouth daily.   polyethylene glycol powder 17 GM/SCOOP powder Commonly known as: GLYCOLAX/MIRALAX Take 17g daily for constipation.   senna 8.6 MG tablet Commonly known as: SENOKOT Take 1 tablet by mouth 2 (two) times daily.   Tecfidera 240 MG Cpdr Generic drug: Dimethyl Fumarate Take 240 mg by mouth 2 (two) times daily.   vitamin B-12 1000 MCG tablet Commonly known as: CYANOCOBALAMIN Take 1,000 mcg by mouth daily.   Vitamin D-3 25 MCG (1000 UT) Caps Take 1,000 Units by mouth daily.       Allergies:  Allergies  Allergen Reactions  . Sulfa Antibiotics Nausea And Vomiting  . Tetanus Toxoids Swelling    Extreme swelling at injection site    Family History: Family History  Problem Relation Age of Onset  . Seizures Mother   . Emphysema Father   . Heart disease Brother   . Kidney disease Neg Hx   . Prostate cancer Neg Hx   . Bladder Cancer Neg Hx     Social History:  reports that she has never smoked. She has never used smokeless tobacco. She reports current alcohol use. She reports that she does not use drugs.  ROS: UROLOGY Frequent Urination?: No Hard to postpone urination?: No Burning/pain with urination?: No Get up at night to urinate?: No Leakage of urine?: No Urine stream starts and stops?: No Trouble starting stream?: No Do you have to strain to urinate?: No Blood in urine?: No Urinary tract infection?: No Sexually transmitted disease?: No Injury to kidneys or bladder?: No Painful intercourse?: No Weak stream?: No Currently pregnant?: No Vaginal bleeding?: No Last menstrual period?: n  Gastrointestinal Nausea?: No Vomiting?: No Indigestion/heartburn?: No Diarrhea?: No Constipation?: Yes  Constitutional Fever: No Night sweats?: No Weight loss?: No Fatigue?: Yes  Skin Skin rash/lesions?: No Itching?: No  Eyes Blurred vision?: No Double vision?: No  Ears/Nose/Throat Sore throat?: No Sinus  problems?: No  Hematologic/Lymphatic Swollen glands?: No Easy bruising?: No  Cardiovascular Leg swelling?: No Chest pain?: No  Respiratory Cough?: No Shortness of breath?: No  Endocrine Excessive thirst?: No  Musculoskeletal Back pain?: No Joint pain?: No  Neurological Headaches?: No Dizziness?: No  Psychologic Depression?: No Anxiety?: No  Physical Exam: BP 134/72   Pulse 70   Ht 5\' 5"  (1.651 m)   Wt 183 lb (83 kg)   BMI 30.45 kg/m   Constitutional:  Well nourished. Alert and oriented, No acute distress. HEENT: [place] AT, moist mucus membranes.  Trachea midline, no masses. Cardiovascular: No clubbing, cyanosis, or edema. Respiratory: Normal respiratory effort, no increased work of breathing. Neurologic: Grossly intact, no focal deficits, moving all 4 extremities. Psychiatric:  Normal mood and affect.   Laboratory Data: No results found for: WBC, HGB, HCT, MCV, PLT  No results found for: CREATININE  No results found for: PSA  No results found for: TESTOSTERONE  No results found for: HGBA1C  No results found for: TSH  No results found for: CHOL, HDL, CHOLHDL, VLDL, LDLCALC  No results found for: AST No results found for: ALT No components found for: ALKALINEPHOPHATASE No components found for: BILIRUBINTOTAL  No results found for: ESTRADIOL  Urinalysis    Component Value Date/Time   COLORURINE YELLOW 03/08/2018 1036   APPEARANCEUR HAZY (A) 03/08/2018 1036   APPEARANCEUR Cloudy (A) 11/10/2017 0851   LABSPEC 1.015 03/08/2018 1036   PHURINE 6.5 03/08/2018 1036   GLUCOSEU NEGATIVE 03/08/2018 1036   HGBUR TRACE (A) 03/08/2018 1036   BILIRUBINUR NEGATIVE 03/08/2018 1036   BILIRUBINUR Negative 11/10/2017 0851   KETONESUR TRACE (A) 03/08/2018 1036   PROTEINUR NEGATIVE 03/08/2018 1036   NITRITE POSITIVE (A) 03/08/2018 1036   LEUKOCYTESUR NEGATIVE 03/08/2018 1036   LEUKOCYTESUR Negative 11/10/2017 0851    I have reviewed the labs.  Assessment &  Plan:    1. Neurogenic bladder Patient is tolerating the Foley catheter well.  She would like to continue the indwelling Foley at this time.  We will exchange the catheter and she will return in 30 days for exchange.  I would like to give her at least 30 more days of the Foley catheter before switching to a SPT.  2. Urinary urgency Continue the Myrbetriq 25 mg daily for spasms  3. Nocturia Foley in place   Return in about 1 month (around 11/08/2019) for Foley exchange on my schedule .  These notes generated with voice recognition software. I apologize for typographical errors.  Michiel Cowboy, PA-C  [facility] [address]  [address] [place], [place] [address] [phone]

## 2019-10-08 ENCOUNTER — Other Ambulatory Visit: Payer: Self-pay

## 2019-10-08 ENCOUNTER — Encounter: Payer: Self-pay | Admitting: Urology

## 2019-10-08 ENCOUNTER — Ambulatory Visit (INDEPENDENT_AMBULATORY_CARE_PROVIDER_SITE_OTHER): Payer: Medicare HMO | Admitting: Urology

## 2019-10-08 VITALS — BP 134/72 | HR 70 | Ht 65.0 in | Wt 183.0 lb

## 2019-10-08 DIAGNOSIS — R3915 Urgency of urination: Secondary | ICD-10-CM

## 2019-10-08 DIAGNOSIS — N319 Neuromuscular dysfunction of bladder, unspecified: Secondary | ICD-10-CM | POA: Diagnosis not present

## 2019-10-08 DIAGNOSIS — R351 Nocturia: Secondary | ICD-10-CM | POA: Diagnosis not present

## 2019-10-08 NOTE — Progress Notes (Signed)
Cath Change/ Replacement  Patient is present today for a catheter change due to urinary retention.  25ml of water was removed from the balloon, a 16FR foley cath was removed with out difficulty.  Patient was cleaned and prepped in a sterile fashion with betadine and 2% lidocaine jelly was instilled into the urethra. A 16 FR foley cath was replaced into the bladder no complications were noted Urine return was noted 65ml and urine was yellow in color. The balloon was filled with 59ml of sterile water. A leg bag was attached for drainage.  A night bag was also given to the patient and patient was given instruction on how to change from one bag to another. Patient was given proper instruction on catheter care.    Performed by: S.Dejour Vos, CMA (AAMA)

## 2019-10-09 ENCOUNTER — Other Ambulatory Visit: Payer: Self-pay | Admitting: Neurology

## 2019-10-09 DIAGNOSIS — G35 Multiple sclerosis: Secondary | ICD-10-CM

## 2019-10-14 ENCOUNTER — Ambulatory Visit: Payer: Medicare HMO | Admitting: Urology

## 2019-10-22 ENCOUNTER — Ambulatory Visit
Admission: RE | Admit: 2019-10-22 | Discharge: 2019-10-22 | Disposition: A | Discharge status: Home / Self Care | Payer: Medicare HMO | Source: Ambulatory Visit | Attending: Neurology | Admitting: Neurology

## 2019-10-22 ENCOUNTER — Other Ambulatory Visit: Payer: Medicare HMO

## 2019-10-22 ENCOUNTER — Other Ambulatory Visit: Payer: Self-pay

## 2019-10-22 DIAGNOSIS — G35 Multiple sclerosis: Secondary | ICD-10-CM

## 2019-11-10 NOTE — Progress Notes (Signed)
11/11/2019 9:10 AM   Adrienne Strickland 01-11-1961 [id number]  Referring provider: Marygrace Drought, MD [address] [facility] [place],  [place] [address]  Chief Complaint  Patient presents with  . Neurogenic bladder    HPI: Adrienne Strickland is a 58 year old female with a neurogenic bladder and urinary urgency who has an indwelling chronic Foley.  She had Botox injected into her bladder on 12/13/2017.  She stated that she feels the Botox did not help her as much as she would of hoped for.  She was going through two diapers daily.  She stated her incontinence volume was severe.    She was CIC x 3 daily.  She set her alarm during the night to wake every 4 hours to cath.    She was becoming exhausted and less dexterous regarding her self cathing.  She stated when she caths herself, the urine is sprayed all over the floor.  She then had to clean herself and the area that is soiled with urine.  As one can imagine, she is worn out for several hours after the incidence.    A Foley catheter was placed on 09/09/2019.  Today, she states that she is satisfied at this time with the indwelling Foley.  She does not want to move forward with SPT at this time in case her dexterity improves and she wants to return to CIC.   Patient denies any gross hematuria, dysuria or suprapubic/flank pain.  Patient denies any fevers, chills, nausea or vomiting.    PMH: Past Medical History:  Diagnosis Date  . Abnormality of gait 09/26/2013  . Arthritis   . Duodenal ulcer 02/27/2015  . Memory difficulty   . Multiple sclerosis ([facility]) 09/26/2013  . Multiple sclerosis ([place])   . Neurogenic bladder    self caths  . Self-catheterizes urinary bladder   . Urinary frequency     Surgical History: Past Surgical History:  Procedure Laterality Date  . BOTOX INJECTION N/A 12/13/2017   Procedure: BOTOX INJECTION;  Surgeon: Adrienne Espy, MD;  Location: [facility] ORS;  Service: Urology;  Laterality: N/A;  .  CHOLECYSTECTOMY    . COLONOSCOPY WITH PROPOFOL N/A 05/22/2018   Procedure: COLONOSCOPY WITH PROPOFOL;  Surgeon: Adrienne Sails, MD;  Location: [facility] ENDOSCOPY;  Service: Endoscopy;  Laterality: N/A;  . CYSTOSCOPY N/A 12/13/2017   Procedure: CYSTOSCOPY;  Surgeon: Adrienne Espy, MD;  Location: [facility] ORS;  Service: Urology;  Laterality: N/A;  . GALLBLADDER SURGERY      Home Medications:  Allergies as of 11/11/2019      Reactions   Sulfa Antibiotics Nausea And Vomiting   Tetanus Toxoids Swelling   Extreme swelling at injection site      Medication List       Accurate as of November 11, 2019  9:10 AM. If you have any questions, ask your nurse or doctor.        STOP taking these medications   polyethylene glycol powder 17 GM/SCOOP powder Commonly known as: GLYCOLAX/MIRALAX Stopped by: Adrienne Izzo, PA-C   senna 8.6 MG tablet Commonly known as: SENOKOT Stopped by: Adrienne Carriger, PA-C     TAKE these medications   Ampyra 10 MG Tb12 Generic drug: dalfampridine Take 10 mg by mouth 2 (two) times daily.   baclofen 10 MG tablet Commonly known as: LIORESAL Take 10 mg by mouth 2 (two) times daily.   donepezil 10 MG tablet Commonly known as: ARICEPT Take 10 mg by mouth at bedtime.  fluticasone 50 MCG/ACT nasal spray Commonly known as: FLONASE Place 2 sprays into both nostrils daily.   fluticasone 50 MCG/BLIST diskus inhaler Commonly known as: FLOVENT DISKUS Inhale 1 puff into the lungs 2 (two) times daily.   Linzess 145 MCG Caps capsule Generic drug: linaclotide Take 145 mcg by mouth daily.   memantine 10 MG tablet Commonly known as: NAMENDA Take 10 mg by mouth 2 (two) times daily.   mirabegron ER 25 MG Tb24 tablet Commonly known as: Myrbetriq TAKE 1 TABLET (25 MG TOTAL) BY MOUTH 2  TIMES DAILY. What changed:   how much to take  how to take this  when to take this  additional instructions   Nortrel 1/35 (28) tablet Generic drug:  norethindrone-ethinyl estradiol 1/35 Take 1 tablet by mouth daily.   oxycodone 5 MG capsule Commonly known as: OXY-IR Take 5 mg by mouth every 6 (six) hours.   pantoprazole 40 MG tablet Commonly known as: PROTONIX Take 40 mg by mouth daily.   Tecfidera 240 MG Cpdr Generic drug: Dimethyl Fumarate Take 240 mg by mouth 2 (two) times daily.   vitamin B-12 1000 MCG tablet Commonly known as: CYANOCOBALAMIN Take 1,000 mcg by mouth daily.   Vitamin D-3 25 MCG (1000 UT) Caps Take 1,000 Units by mouth daily.       Allergies:  Allergies  Allergen Reactions  . Sulfa Antibiotics Nausea And Vomiting  . Tetanus Toxoids Swelling    Extreme swelling at injection site    Family History: Family History  Problem Relation Age of Onset  . Seizures Mother   . Emphysema Father   . Heart disease Brother   . Kidney disease Neg Hx   . Prostate cancer Neg Hx   . Bladder Cancer Neg Hx     Social History:  reports that she has never smoked. She has never used smokeless tobacco. She reports current alcohol use. She reports that she does not use drugs.  ROS: UROLOGY Frequent Urination?: No Hard to postpone urination?: No Burning/pain with urination?: No Get up at night to urinate?: No Leakage of urine?: No Urine stream starts and stops?: No Trouble starting stream?: No Do you have to strain to urinate?: No Blood in urine?: No Urinary tract infection?: No Sexually transmitted disease?: No Injury to kidneys or bladder?: No Painful intercourse?: No Weak stream?: No Currently pregnant?: No Vaginal bleeding?: No Last menstrual period?: n  Gastrointestinal Nausea?: No Vomiting?: No Indigestion/heartburn?: No Diarrhea?: No Constipation?: No  Constitutional Fever: No Night sweats?: No Weight loss?: No Fatigue?: No  Skin Skin rash/lesions?: No Itching?: No  Eyes Blurred vision?: No Double vision?: No  Ears/Nose/Throat Sore throat?: No Sinus problems?: No   Hematologic/Lymphatic Swollen glands?: No Easy bruising?: No  Cardiovascular Leg swelling?: No Chest pain?: No  Respiratory Cough?: No Shortness of breath?: No  Endocrine Excessive thirst?: No  Musculoskeletal Back pain?: No Joint pain?: No  Neurological Headaches?: No Dizziness?: No  Psychologic Depression?: No Anxiety?: No  Physical Exam: BP 135/67 (BP Location: Left Arm, Patient Position: Sitting, Cuff Size: Normal)   Pulse 87   Ht 5\' 5"  (1.651 m)   Wt 186 lb (84.4 kg)   BMI 30.95 kg/m   Constitutional:  Well nourished. Alert and oriented, No acute distress. HEENT: [place] AT, moist mucus membranes.  Trachea midline, no masses. Cardiovascular: No clubbing, cyanosis, or edema. Respiratory: Normal respiratory effort, no increased work of breathing. GI: Abdomen is soft, non tender, non distended, no abdominal masses. Liver and spleen  not palpable.  No hernias appreciated.  Stool sample for occult testing is not indicated.   GU: No CVA tenderness.  No bladder fullness or masses.  Normal external genitalia, sparse pubic hair distribution, no lesions.  Normal urethral meatus, no lesions, no prolapse, no discharge.   No urethral masses, tenderness and/or tenderness. Anus and perineum are without rashes or lesions.    Skin: No rashes, bruises or suspicious lesions. Lymph: No inguinal adenopathy. Neurologic: Grossly intact, moving all 4 extremities.  Uses a walker to ambulate.   Psychiatric: Normal mood and affect.   Laboratory Data: No results found for: WBC, HGB, HCT, MCV, PLT  No results found for: CREATININE  No results found for: PSA  No results found for: TESTOSTERONE  No results found for: HGBA1C  No results found for: TSH  No results found for: CHOL, HDL, CHOLHDL, VLDL, LDLCALC  No results found for: AST No results found for: ALT No components found for: ALKALINEPHOPHATASE No components found for: BILIRUBINTOTAL  No results found for: ESTRADIOL   Urinalysis    Component Value Date/Time   COLORURINE YELLOW 03/08/2018 1036   APPEARANCEUR HAZY (A) 03/08/2018 1036   APPEARANCEUR Cloudy (A) 11/10/2017 0851   LABSPEC 1.015 03/08/2018 1036   PHURINE 6.5 03/08/2018 1036   GLUCOSEU NEGATIVE 03/08/2018 1036   HGBUR TRACE (A) 03/08/2018 1036   BILIRUBINUR NEGATIVE 03/08/2018 1036   BILIRUBINUR Negative 11/10/2017 0851   KETONESUR TRACE (A) 03/08/2018 1036   PROTEINUR NEGATIVE 03/08/2018 1036   NITRITE POSITIVE (A) 03/08/2018 1036   LEUKOCYTESUR NEGATIVE 03/08/2018 1036   LEUKOCYTESUR Negative 11/10/2017 0851    I have reviewed the labs.  Procedure Cath Change/ Replacement Patient is present today for a catheter change due to urinary retention.  10 ml of water was removed from the balloon, a 16 FR foley cath was removed with out difficulty.  Patient was cleaned and prepped in a sterile fashion with betadine and 2% lidocaine jelly was instilled into the urethra. A 16 FR foley cath was replaced into the bladder no complications were noted Urine return was noted 50 ml and urine was yellow in color. The balloon was filled with 7ml of sterile water. A leg bag was attached for drainage.  A night bag was also given to the patient and patient was given instruction on how to change from one bag to another. Patient was given proper instruction on catheter care.    Performed by: Michiel Cowboy, PA-C  Assessment & Plan:    1. Neurogenic bladder Foley catheter exchanged today.  She would like to continue with an indwelling Foley at this time rather than switching to SPT as she is hopeful she will be able to return to CIC in the future.  2. Urinary urgency Continue the Myrbetriq 25 mg daily for spasms  3. Nocturia Foley in place   Return in about 1 month (around 12/11/2019) for Foley catheter exchange.  These notes generated with voice recognition software. I apologize for typographical errors.  Michiel Cowboy, PA-C  [facility] [address]  [address] [place], [place] [address] [phone]  I spent 15 with this patient in a face to face conversation of which greater than 50% was spent in counseling and coordination of care with the patient.

## 2019-11-11 ENCOUNTER — Other Ambulatory Visit: Payer: Self-pay

## 2019-11-11 ENCOUNTER — Ambulatory Visit (INDEPENDENT_AMBULATORY_CARE_PROVIDER_SITE_OTHER): Payer: Medicare HMO | Admitting: Urology

## 2019-11-11 ENCOUNTER — Encounter: Payer: Self-pay | Admitting: Urology

## 2019-11-11 VITALS — BP 135/67 | HR 87 | Ht 65.0 in | Wt 186.0 lb

## 2019-11-11 DIAGNOSIS — N319 Neuromuscular dysfunction of bladder, unspecified: Secondary | ICD-10-CM | POA: Diagnosis not present

## 2019-11-11 DIAGNOSIS — Z978 Presence of other specified devices: Secondary | ICD-10-CM

## 2019-12-08 NOTE — Progress Notes (Signed)
Cath Change/ Replacement Patient is present today for a catheter change due to urinary retention.  10 ml of water was removed from the balloon, a 16 FR foley cath was removed with out difficulty.  Patient was cleaned and prepped in a sterile fashion with betadine and 2% lidocaine jelly was instilled into the urethra. A 16 FR coude foley cath was replaced as that was all was in stock into the bladder no complications were noted Urine return was noted 5 ml and urine was yellow in color. The balloon was filled with 25ml of sterile water. A leg bag was attached for drainage.  A night bag was also given to the patient and patient was given instruction on how to change from one bag to another. Patient was given proper instruction on catheter care.    Performed by: Zara Council, PA-C and Elberta Leatherwood, [place]

## 2019-12-09 ENCOUNTER — Encounter: Payer: Self-pay | Admitting: Urology

## 2019-12-09 ENCOUNTER — Ambulatory Visit (INDEPENDENT_AMBULATORY_CARE_PROVIDER_SITE_OTHER): Payer: Medicare HMO | Admitting: Urology

## 2019-12-09 ENCOUNTER — Other Ambulatory Visit: Payer: Self-pay

## 2019-12-09 DIAGNOSIS — Z978 Presence of other specified devices: Secondary | ICD-10-CM

## 2019-12-10 ENCOUNTER — Telehealth: Payer: Self-pay | Admitting: Urology

## 2019-12-10 NOTE — Telephone Encounter (Signed)
Pt called and states that her cath is leaking.

## 2019-12-11 NOTE — Telephone Encounter (Signed)
Spoke to patient, she had a catheter changed on Monday. She states she is having a significant amount of urine in her diaper and not much going in to the bag. When we changed her catheter on Monday we informed her that she needed to be sure that the leg strap stays high on the leg so the catheter is not pulling. She voiced understanding. She states the leg strap is high and she feels like everything is in place but is concerned about the leaking. Do you any suggestions or would you like to check the placement out?

## 2019-12-11 NOTE — Telephone Encounter (Signed)
Pt. Returning missed call and  would like someone to call her back regarding cath issues.

## 2019-12-11 NOTE — Telephone Encounter (Signed)
LMOM for patient to return call.

## 2019-12-12 ENCOUNTER — Ambulatory Visit (INDEPENDENT_AMBULATORY_CARE_PROVIDER_SITE_OTHER): Payer: Medicare HMO | Admitting: Physician Assistant

## 2019-12-12 ENCOUNTER — Other Ambulatory Visit: Payer: Self-pay

## 2019-12-12 DIAGNOSIS — T839XXA Unspecified complication of genitourinary prosthetic device, implant and graft, initial encounter: Secondary | ICD-10-CM

## 2019-12-12 NOTE — Telephone Encounter (Signed)
Patient is scheduled today.

## 2019-12-12 NOTE — Progress Notes (Signed)
Patient presents today with complaints of her catheter leaking from the urethra. I looked at the position of the catheter and it was not in the correct place. There was some pull and when I reapplied the sticker to hold the catheter in place urine started draining fine. I gave instructions on how to keep her catheter in the correct place.   Performed by: Elberta Leatherwood, CMA  Follow up: As scheduled for catheter change

## 2019-12-12 NOTE — Telephone Encounter (Signed)
Please put her on my schedule tomorrow.

## 2019-12-16 ENCOUNTER — Telehealth: Payer: Self-pay | Admitting: Urology

## 2019-12-16 ENCOUNTER — Ambulatory Visit
Admission: RE | Admit: 2019-12-16 | Discharge: 2019-12-16 | Disposition: A | Discharge status: Home / Self Care | Payer: Medicare HMO | Attending: Urology | Admitting: Urology

## 2019-12-16 ENCOUNTER — Ambulatory Visit
Admission: RE | Admit: 2019-12-16 | Discharge: 2019-12-16 | Disposition: A | Discharge status: Home / Self Care | Payer: Medicare HMO | Source: Ambulatory Visit | Attending: Urology | Admitting: Urology

## 2019-12-16 ENCOUNTER — Other Ambulatory Visit: Payer: Self-pay

## 2019-12-16 ENCOUNTER — Ambulatory Visit: Payer: Medicare HMO | Admitting: Urology

## 2019-12-16 ENCOUNTER — Ambulatory Visit: Payer: Medicare HMO | Admitting: Physician Assistant

## 2019-12-16 ENCOUNTER — Encounter: Payer: Self-pay | Admitting: Urology

## 2019-12-16 VITALS — BP 128/68 | HR 77 | Temp 98.2°F | Ht 65.0 in | Wt 190.0 lb

## 2019-12-16 DIAGNOSIS — N319 Neuromuscular dysfunction of bladder, unspecified: Secondary | ICD-10-CM

## 2019-12-16 NOTE — Progress Notes (Signed)
12/16/2019 10:34 PM   Adrienne Strickland [birth year] [id number]  Referring provider: Marygrace Drought, MD [address] [facility] [place],  [place] [address]  Chief Complaint  Patient presents with  . Follow-up    catheter    HPI: [name] is a 58 year old female with a neurogenic bladder and urinary urgency who has an indwelling chronic Foley.  She had Botox injected into her bladder on 12/13/2017.  She stated that she feels the Botox did not help her as much as she would of hoped for.  She was going through two diapers daily.  She stated her incontinence volume was severe.    She was CIC x 3 daily.  She set her alarm during the night to wake every 4 hours to cath.    She was becoming exhausted and less dexterous regarding her self cathing.  She stated when she caths herself, the urine is sprayed all over the floor.  She then had to clean herself and the area that is soiled with urine.  As one can imagine, she is worn out for several hours after the incidence.    A Foley catheter was placed on 09/09/2019.  Today, she comes in stating the catheter is not working.  She is having leakage around the catheter.    She even tried to self cath while the catheter is in place.  She states she is on Myrbetriq 25 mg daily.  She is wearing depends and they are soaked at times.  Patient denies any gross hematuria, dysuria or suprapubic/flank pain.  Patient denies any fevers, chills, nausea or vomiting.   KUB 12/16/2019 no stones seen.    PMH: Past Medical History:  Diagnosis Date  . Abnormality of gait 09/26/2013  . Arthritis   . Duodenal ulcer 02/27/2015  . Memory difficulty   . Multiple sclerosis ([place]) 09/26/2013  . Multiple sclerosis ([place])   . Neurogenic bladder    self caths  . Self-catheterizes urinary bladder   . Urinary frequency     Surgical History: Past Surgical History:  Procedure Laterality Date  . BOTOX INJECTION N/A 12/13/2017   Procedure: BOTOX  INJECTION;  Surgeon: Hollice Espy, MD;  Location: [facility] ORS;  Service: Urology;  Laterality: N/A;  . CHOLECYSTECTOMY    . COLONOSCOPY WITH PROPOFOL N/A 05/22/2018   Procedure: COLONOSCOPY WITH PROPOFOL;  Surgeon: Lollie Sails, MD;  Location: [facility] ENDOSCOPY;  Service: Endoscopy;  Laterality: N/A;  . CYSTOSCOPY N/A 12/13/2017   Procedure: CYSTOSCOPY;  Surgeon: Hollice Espy, MD;  Location: [facility] ORS;  Service: Urology;  Laterality: N/A;  . GALLBLADDER SURGERY      Home Medications:  Allergies as of 12/16/2019      Reactions   Sulfa Antibiotics Nausea And Vomiting   Tetanus Toxoids Swelling   Extreme swelling at injection site      Medication List       Accurate as of December 16, 2019 11:59 PM. If you have any questions, ask your nurse or doctor.        Ampyra 10 MG Tb12 Generic drug: dalfampridine Take 10 mg by mouth 2 (two) times daily.   baclofen 10 MG tablet Commonly known as: LIORESAL Take 10 mg by mouth 2 (two) times daily.   donepezil 10 MG tablet Commonly known as: ARICEPT Take 10 mg by mouth at bedtime.   fluticasone 50 MCG/ACT nasal spray Commonly known as: FLONASE Place 2 sprays into both nostrils daily.   fluticasone 50  MCG/BLIST diskus inhaler Commonly known as: FLOVENT DISKUS Inhale 1 puff into the lungs 2 (two) times daily.   Linzess 145 MCG Caps capsule Generic drug: linaclotide Take 145 mcg by mouth daily.   memantine 10 MG tablet Commonly known as: NAMENDA Take 10 mg by mouth 2 (two) times daily.   mirabegron ER 25 MG Tb24 tablet Commonly known as: Myrbetriq TAKE 1 TABLET (25 MG TOTAL) BY MOUTH 2  TIMES DAILY. What changed:   how much to take  how to take this  when to take this  additional instructions   Nortrel 1/35 (28) tablet Generic drug: norethindrone-ethinyl estradiol 1/35 Take 1 tablet by mouth daily.   oxycodone 5 MG capsule Commonly known as: OXY-IR Take 5 mg by mouth every 6 (six) hours.   pantoprazole 40 MG  tablet Commonly known as: PROTONIX Take 40 mg by mouth daily.   Tecfidera 240 MG Cpdr Generic drug: Dimethyl Fumarate Take 240 mg by mouth 2 (two) times daily.   vitamin B-12 1000 MCG tablet Commonly known as: CYANOCOBALAMIN Take 1,000 mcg by mouth daily.   Vitamin D-3 25 MCG (1000 UT) Caps Take 1,000 Units by mouth daily.       Allergies:  Allergies  Allergen Reactions  . Sulfa Antibiotics Nausea And Vomiting  . Tetanus Toxoids Swelling    Extreme swelling at injection site    Family History: Family History  Problem Relation Age of Onset  . Seizures Mother   . Emphysema Father   . Heart disease Brother   . Kidney disease Neg Hx   . Prostate cancer Neg Hx   . Bladder Cancer Neg Hx     Social History:  reports that she has never smoked. She has never used smokeless tobacco. She reports current alcohol use. She reports that she does not use drugs.  ROS: UROLOGY Frequent Urination?: No Hard to postpone urination?: No Burning/pain with urination?: No Get up at night to urinate?: No Leakage of urine?: No Urine stream starts and stops?: No Trouble starting stream?: No Do you have to strain to urinate?: No Blood in urine?: No Urinary tract infection?: No Sexually transmitted disease?: No Injury to kidneys or bladder?: No Painful intercourse?: No Weak stream?: No Currently pregnant?: No Vaginal bleeding?: No Last menstrual period?: n  Gastrointestinal Nausea?: No Vomiting?: No Indigestion/heartburn?: No Diarrhea?: No Constipation?: No  Constitutional Fever: No Night sweats?: No Weight loss?: No Fatigue?: No  Skin Skin rash/lesions?: No Itching?: No  Eyes Blurred vision?: No Double vision?: No  Ears/Nose/Throat Sore throat?: No Sinus problems?: No  Hematologic/Lymphatic Swollen glands?: No Easy bruising?: No  Cardiovascular Leg swelling?: No Chest pain?: No  Respiratory Cough?: No Shortness of breath?: No  Endocrine Excessive  thirst?: No  Musculoskeletal Back pain?: No Joint pain?: No  Neurological Headaches?: No Dizziness?: No  Psychologic Depression?: No Anxiety?: No  Physical Exam: BP 128/68   Pulse 77   Temp 98.2 F (36.8 C)   Ht 5\' 5"  (1.651 m)   Wt 190 lb (86.2 kg)   SpO2 95%   BMI 31.62 kg/m   Constitutional:  Well nourished. Alert and oriented, No acute distress. HEENT: [place] AT, moist mucus membranes.  Trachea midline, no masses. Cardiovascular: No clubbing, cyanosis, or edema. Respiratory: Normal respiratory effort, no increased work of breathing. GI: Abdomen is soft, non tender, non distended, no abdominal masses. Liver and spleen not palpable.  No hernias appreciated.  Stool sample for occult testing is not indicated.   GU: No  CVA tenderness.  No bladder fullness or masses.  Foley in place but the catheter securing patch is further down her thigh and the leg bag is attached below the knee.  Neurologic: Grossly intact, no focal deficits, moving all 4 extremities. Psychiatric: Normal mood and affect.   Laboratory Data: No results found for: WBC, HGB, HCT, MCV, PLT  No results found for: CREATININE  No results found for: PSA  No results found for: TESTOSTERONE  No results found for: HGBA1C  No results found for: TSH  No results found for: CHOL, HDL, CHOLHDL, VLDL, LDLCALC  No results found for: AST No results found for: ALT No components found for: ALKALINEPHOPHATASE No components found for: BILIRUBINTOTAL  No results found for: ESTRADIOL  Urinalysis    Component Value Date/Time   COLORURINE YELLOW 03/08/2018 1036   APPEARANCEUR HAZY (A) 03/08/2018 1036   APPEARANCEUR Cloudy (A) 11/10/2017 0851   LABSPEC 1.015 03/08/2018 1036   PHURINE 6.5 03/08/2018 1036   GLUCOSEU NEGATIVE 03/08/2018 1036   HGBUR TRACE (A) 03/08/2018 1036   BILIRUBINUR NEGATIVE 03/08/2018 1036   BILIRUBINUR Negative 11/10/2017 0851   KETONESUR TRACE (A) 03/08/2018 1036   PROTEINUR NEGATIVE  03/08/2018 1036   NITRITE POSITIVE (A) 03/08/2018 1036   LEUKOCYTESUR NEGATIVE 03/08/2018 1036   LEUKOCYTESUR Negative 11/10/2017 0851    I have reviewed the labs.  Assessment & Plan:    1. Neurogenic bladder We discussed removing the Foley and returning to self cathing or placement of a SPT - she defers and is satisfied with the Foley  I readjusted her Foley catheter to allow slack in the line and explained to the patient that she needs to keep the leg bag secured to her thigh and not allow it to go below the knee as the tension may cause the Foley balloon to irritate the trigone area of the bladder KUB did not identify any stones She will confirm that she is on Myrbetriq and what dose she is taking  2. Urinary urgency Continue the Myrbetriq 25 mg daily for spasms  3. Nocturia Foley in place   Return for Keep follow up appointment .  These notes generated with voice recognition software. I apologize for typographical errors.  Michiel CowboySHANNON Destenie Ingber, PA-C  [facility] [address]  [address] [place], [place] [address] [phone]

## 2019-12-16 NOTE — Telephone Encounter (Signed)
Pt called office asking to be seen today as her catheter seems to be out of alignment, no urine in bag, her diaper is doing all the work.  Added pt to schedule, as there was availability.   FYI

## 2019-12-23 ENCOUNTER — Telehealth: Payer: Self-pay | Admitting: Urology

## 2019-12-23 NOTE — Telephone Encounter (Signed)
Pt LMOM and requests a call back, she states that her cath is still leaking and she would like some advise.

## 2019-12-23 NOTE — Telephone Encounter (Signed)
Left pt mess to call 

## 2019-12-24 MED ORDER — MIRABEGRON ER 50 MG PO TB24: 50.0000 mg | ORAL_TABLET | Freq: Every day | ORAL | 11 refills | Status: DC

## 2019-12-24 NOTE — Telephone Encounter (Signed)
Spoke to patient and she states she is keeping the catheter as far up her leg as possible and trying not to let it fall. She states she is taking 25mg  of Myrbetriq. 50 mg Myrbetriq was sent to pharmacy. I informed patient that if this medication does not help with the urinary leakage we can do a UDS study. Patient voiced understanding.

## 2019-12-24 NOTE — Telephone Encounter (Signed)
Pt called back and states that she is still having leaking issues and she states that she does not want to come into office right now.She would like a call back.

## 2020-01-05 NOTE — Progress Notes (Signed)
01/06/2020 9:34 AM   Strickland Strickland [birth year] [id number]  Referring provider: Marygrace Drought, MD [address] [facility] [place],  [place] Strickland [id number]  Chief Complaint  Patient presents with  . Other    HPI: Mrs. Hook is a 59 year old female with a neurogenic bladder and urinary urgency who has an indwelling chronic Foley.  She had Botox injected into her bladder on 12/13/2017.  She stated that she feels the Botox did not help her as much as she would of hoped for.  She was going through two diapers daily.  She stated her incontinence volume was severe.    She was CIC x 3 daily.  She set her alarm during the night to wake every 4 hours to cath.    She was becoming exhausted and less dexterous regarding her self cathing.  She stated when she caths herself, the urine is sprayed all over the floor.  She then had to clean herself and the area that is soiled with urine.  As one can imagine, she is worn out for several hours after the incidence.    A Foley catheter was placed on 09/09/2019 and has been changed on a monthly basis in our office.  KUB 12/16/2019 no stones seen.    She continues to have intense bladder spasms associated with large volumes of urinary leakage.  Patient denies any gross hematuria, dysuria or suprapubic/flank pain.  Patient denies any fevers, chills, nausea or vomiting.  She had a witnessed bladder spasm with leakage by me during her office visit.   PMH: Past Medical History:  Diagnosis Date  . Abnormality of gait 09/26/2013  . Arthritis   . Duodenal ulcer 02/27/2015  . Memory difficulty   . Multiple sclerosis ([place]) 09/26/2013  . Multiple sclerosis ([place])   . Neurogenic bladder    self caths  . Self-catheterizes urinary bladder   . Urinary frequency     Surgical History: Past Surgical History:  Procedure Laterality Date  . BOTOX INJECTION N/A 12/13/2017   Procedure: BOTOX INJECTION;  Surgeon: Hollice Espy, MD;  Location: [facility]  ORS;  Service: Urology;  Laterality: N/A;  . CHOLECYSTECTOMY    . COLONOSCOPY WITH PROPOFOL N/A 05/22/2018   Procedure: COLONOSCOPY WITH PROPOFOL;  Surgeon: Lollie Sails, MD;  [facility] ENDOSCOPY;  Service: Endoscopy;  Laterality: N/A;  . CYSTOSCOPY N/A 12/13/2017   Procedure: CYSTOSCOPY;  Surgeon: Hollice Espy, MD;  Location: [facility] ORS;  Service: Urology;  Laterality: N/A;  . GALLBLADDER SURGERY      Home Medications:  Allergies as of 01/06/2020      Reactions   Sulfa Antibiotics Nausea And Vomiting   Tetanus Toxoids Swelling   Extreme swelling at injection site      Medication List       Accurate as of January 06, 2020  9:34 AM. If you have any questions, ask your nurse or doctor.        Ampyra 10 MG Tb12 Generic drug: dalfampridine Take 10 mg by mouth 2 (two) times daily.   baclofen 10 MG tablet Commonly known as: LIORESAL Take 10 mg by mouth 2 (two) times daily.   donepezil 10 MG tablet Commonly known as: ARICEPT Take 10 mg by mouth at bedtime.   fluticasone 50 MCG/ACT nasal spray Commonly known as: FLONASE Place 2 sprays into both nostrils daily.   fluticasone 50 MCG/BLIST diskus inhaler Commonly known as: FLOVENT DISKUS Inhale 1 puff into the lungs 2 (two) times  daily.   gluconic acid-citric acid irrigation Insert 30 mL into suprapubic tube and clamp tube for 10 minutes and then drain twice daily Started by: Adrienne Stach, PA-C   Linzess 145 MCG Caps capsule Generic drug: linaclotide Take 145 mcg by mouth daily.   memantine 10 MG tablet Commonly known as: NAMENDA Take 10 mg by mouth 2 (two) times daily.   mirabegron ER 50 MG Tb24 tablet Commonly known as: MYRBETRIQ Take 1 tablet (50 mg total) by mouth daily.   Nortrel 1/35 (28) tablet Generic drug: norethindrone-ethinyl estradiol 1/35 Take 1 tablet by mouth daily.   oxycodone 5 MG capsule Commonly known as: OXY-IR Take 5 mg by mouth every 6 (six) hours.   pantoprazole 40 MG  tablet Commonly known as: PROTONIX Take 40 mg by mouth daily.   Tecfidera 240 MG Cpdr Generic drug: Dimethyl Fumarate Take 240 mg by mouth 2 (two) times daily.   vitamin B-12 1000 MCG tablet Commonly known as: CYANOCOBALAMIN Take 1,000 mcg by mouth daily.   Vitamin D-3 25 MCG (1000 UT) Caps Take 1,000 Units by mouth daily.       Allergies:  Allergies  Allergen Reactions  . Sulfa Antibiotics Nausea And Vomiting  . Tetanus Toxoids Swelling    Extreme swelling at injection site    Family History: Family History  Problem Relation Age of Onset  . Seizures Mother   . Emphysema Father   . Heart disease Brother   . Kidney disease Neg Hx   . Prostate cancer Neg Hx   . Bladder Cancer Neg Hx     Social History:  reports that she has never smoked. She has never used smokeless tobacco. She reports current alcohol use. She reports that she does not use drugs.  ROS: UROLOGY Frequent Urination?: No Hard to postpone urination?: No Burning/pain with urination?: No Get up at night to urinate?: No Leakage of urine?: Yes Urine stream starts and stops?: No Trouble starting stream?: No Do you have to strain to urinate?: No Blood in urine?: No Urinary tract infection?: No Sexually transmitted disease?: No Injury to kidneys or bladder?: No Painful intercourse?: No Weak stream?: No Currently pregnant?: No Vaginal bleeding?: No Last menstrual period?: n  Gastrointestinal Nausea?: No Vomiting?: No Indigestion/heartburn?: No Diarrhea?: No Constipation?: No  Constitutional Fever: No Night sweats?: No Weight loss?: No Fatigue?: No  Skin Skin rash/lesions?: No Itching?: No  Eyes Blurred vision?: No Double vision?: No  Ears/Nose/Throat Sore throat?: No Sinus problems?: No  Hematologic/Lymphatic Swollen glands?: No Easy bruising?: No  Cardiovascular Leg swelling?: No Chest pain?: No  Respiratory Cough?: No Shortness of breath?: No  Endocrine Excessive  thirst?: No  Musculoskeletal Back pain?: No Joint pain?: No  Neurological Headaches?: No Dizziness?: No  Psychologic Depression?: No Anxiety?: No  Physical Exam: There were no vitals taken for this visit.  Constitutional:  Well nourished. Alert and oriented, No acute distress. HEENT:  AT, mask in place.  Trachea midline, no masses. Cardiovascular: No clubbing, cyanosis, or edema. Respiratory: Normal respiratory effort, no increased work of breathing. GI: Abdomen is soft, non tender, non distended, no abdominal masses. Liver and spleen not palpable.  No hernias appreciated.  Stool sample for occult testing is not indicated.   GU: No CVA tenderness.  No bladder fullness or masses.  Foley in place.  Anus and perineum are without rashes or lesions.    Skin: No rashes, bruises or suspicious lesions. Lymph: No inguinal adenopathy. Neurologic: Grossly intact, no focal deficits, moving  all 4 extremities.  Using a walker.   Psychiatric: Normal mood and affect.   Laboratory Data: No results found for: WBC, HGB, HCT, MCV, PLT  No results found for: CREATININE  No results found for: PSA  No results found for: TESTOSTERONE  No results found for: HGBA1C  No results found for: TSH  No results found for: CHOL, HDL, CHOLHDL, VLDL, LDLCALC  No results found for: AST No results found for: ALT No components found for: ALKALINEPHOPHATASE No components found for: BILIRUBINTOTAL  No results found for: ESTRADIOL  Urinalysis    Component Value Date/Time   COLORURINE YELLOW 03/08/2018 1036   APPEARANCEUR HAZY (A) 03/08/2018 1036   APPEARANCEUR Cloudy (A) 11/10/2017 0851   LABSPEC 1.015 03/08/2018 1036   PHURINE 6.5 03/08/2018 1036   GLUCOSEU NEGATIVE 03/08/2018 1036   HGBUR TRACE (A) 03/08/2018 1036   BILIRUBINUR NEGATIVE 03/08/2018 1036   BILIRUBINUR Negative 11/10/2017 0851   KETONESUR TRACE (A) 03/08/2018 1036   PROTEINUR NEGATIVE 03/08/2018 1036   NITRITE POSITIVE (A)  03/08/2018 1036   LEUKOCYTESUR NEGATIVE 03/08/2018 1036   LEUKOCYTESUR Negative 11/10/2017 0851    I have reviewed the labs.  Cath Change/ Replacement Patient is present today for a catheter change due to urinary retention.  10 ml of water was removed from the balloon, a 16 FR foley cath was removed with out difficulty.  It was noticed to be incrusted with sediment.  Patient was cleaned and prepped in a sterile fashion with betadine and 2% lidocaine jelly was instilled into the urethra. An 43 FR foley cath was replaced into the bladder no complications were noted   Urine return was noted 10 ml and urine was yellow in color. The balloon was filled with 74ml of sterile water. A leg bag was attached for drainage.  A night bag was also given to the patient and patient was given instruction on how to change from one bag to another. Patient was given proper instruction on catheter care.    Performed by: Michiel Cowboy, PA-C  Assessment & Plan:    1. Neurogenic bladder Continue with monthly Foley catheter exchanges RTC in one month for Foley exchange  2. Bladder spasms Continue the Myrbetriq 50 mg daily for spasms Prescribed Renacidin to irrigate twice daily through the catheter  3. Nocturia Foley in place   Return in about 1 month (around 02/06/2020) for Foley exchange .  These notes generated with voice recognition software. I apologize for typographical errors.  Michiel Cowboy, PA-C  [facility] [address]  [address] [place], [place] [address] [phone]

## 2020-01-06 ENCOUNTER — Ambulatory Visit: Payer: Medicare HMO | Admitting: Urology

## 2020-01-06 ENCOUNTER — Encounter: Payer: Self-pay | Admitting: Urology

## 2020-01-06 ENCOUNTER — Other Ambulatory Visit: Payer: Self-pay

## 2020-01-06 ENCOUNTER — Telehealth: Payer: Self-pay | Admitting: Urology

## 2020-01-06 DIAGNOSIS — N319 Neuromuscular dysfunction of bladder, unspecified: Secondary | ICD-10-CM

## 2020-01-06 DIAGNOSIS — N3289 Other specified disorders of bladder: Secondary | ICD-10-CM

## 2020-01-06 DIAGNOSIS — T839XXA Unspecified complication of genitourinary prosthetic device, implant and graft, initial encounter: Secondary | ICD-10-CM

## 2020-01-06 MED ORDER — RENACIDIN IR SOLN: 1 refills | Status: DC

## 2020-01-06 NOTE — Telephone Encounter (Signed)
Pt LMOM and has some questions about her visit w/Shannon this morning.

## 2020-01-06 NOTE — Telephone Encounter (Signed)
Spoke to patient and explained how to use the Renacidin. Patient voiced understanding.

## 2020-02-10 ENCOUNTER — Encounter: Payer: Self-pay | Admitting: Urology

## 2020-02-10 ENCOUNTER — Other Ambulatory Visit: Payer: Self-pay

## 2020-02-10 ENCOUNTER — Ambulatory Visit: Payer: Medicare HMO | Admitting: Urology

## 2020-02-10 DIAGNOSIS — N319 Neuromuscular dysfunction of bladder, unspecified: Secondary | ICD-10-CM | POA: Diagnosis not present

## 2020-02-10 DIAGNOSIS — N3289 Other specified disorders of bladder: Secondary | ICD-10-CM

## 2020-02-10 DIAGNOSIS — T839XXA Unspecified complication of genitourinary prosthetic device, implant and graft, initial encounter: Secondary | ICD-10-CM

## 2020-02-10 MED ORDER — RENACIDIN IR SOLN: 1 refills | Status: DC

## 2020-02-10 NOTE — Progress Notes (Signed)
02/10/2020 10:24 AM   Adrienne Strickland Apr 29, 1961 [id number]  Referring provider: Sharilyn Sites, MD [address] [facility] [place],  [place] [address]  Chief Complaint  Patient presents with  . Urinary Retention    HPI: Adrienne Strickland is a 59 year old female with a neurogenic bladder and urinary urgency who has an indwelling chronic Foley.  A Foley catheter was placed on 09/09/2019 and has been changed on a monthly basis in our office.  KUB 12/16/2019 no stones seen.    She continues to have instances of intense bladder spasms causing her to leak around the catheter associated with sediment production which causes the catheter to become clogged.  She states the first few days immediately following the Foley catheter placement she has no issues with leakage around the catheter, but it becomes more and more severe the closer it comes to the date of exchange.  She had been prescribed Renacidin for bladder irrigation, but she states she is not having success as it too is leaking around the catheter.  PMH: Past Medical History:  Diagnosis Date  . Abnormality of gait 09/26/2013  . Arthritis   . Duodenal ulcer 02/27/2015  . Memory difficulty   . Multiple sclerosis ([facility]) 09/26/2013  . Multiple sclerosis ([facility])   . Neurogenic bladder    self caths  . Self-catheterizes urinary bladder   . Urinary frequency     Surgical History: Past Surgical History:  Procedure Laterality Date  . BOTOX INJECTION N/A 12/13/2017   Procedure: BOTOX INJECTION;  Surgeon: Vanna Scotland, MD;  Location: [facility] ORS;  Service: Urology;  Laterality: N/A;  . CHOLECYSTECTOMY    . COLONOSCOPY WITH PROPOFOL N/A 05/22/2018   Procedure: COLONOSCOPY WITH PROPOFOL;  Surgeon: Christena Deem, MD;  Location: [facility] ENDOSCOPY;  Service: Endoscopy;  Laterality: N/A;  . CYSTOSCOPY N/A 12/13/2017   Procedure: CYSTOSCOPY;  Surgeon: Vanna Scotland, MD;  Location: [facility] ORS;  Service: Urology;  Laterality:  N/A;  . GALLBLADDER SURGERY      Home Medications:  Allergies as of 02/10/2020      Reactions   Sulfa Antibiotics Nausea And Vomiting   Tetanus Toxoids Swelling   Extreme swelling at injection site      Medication List       Accurate as of February 10, 2020 10:24 AM. If you have any questions, ask your nurse or doctor.        Ampyra 10 MG Tb12 Generic drug: dalfampridine Take 10 mg by mouth 2 (two) times daily.   baclofen 10 MG tablet Commonly known as: LIORESAL Take 10 mg by mouth 2 (two) times daily.   donepezil 10 MG tablet Commonly known as: ARICEPT Take 10 mg by mouth at bedtime.   fluticasone 50 MCG/ACT nasal spray Commonly known as: FLONASE Place 2 sprays into both nostrils daily.   fluticasone 50 MCG/BLIST diskus inhaler Commonly known as: FLOVENT DISKUS Inhale 1 puff into the lungs 2 (two) times daily.   gluconic acid-citric acid irrigation Insert 30 mL into suprapubic tube and clamp tube for 10 minutes and then drain twice daily   Linzess 145 MCG Caps capsule Generic drug: linaclotide Take 145 mcg by mouth daily.   memantine 10 MG tablet Commonly known as: NAMENDA Take 10 mg by mouth 2 (two) times daily.   mirabegron ER 50 MG Tb24 tablet Commonly known as: MYRBETRIQ Take 1 tablet (50 mg total) by mouth daily.   Nortrel 1/35 (28) tablet Generic drug: norethindrone-ethinyl estradiol  1/35 Take 1 tablet by mouth daily.   oxycodone 5 MG capsule Commonly known as: OXY-IR Take 5 mg by mouth every 6 (six) hours.   pantoprazole 40 MG tablet Commonly known as: PROTONIX Take 40 mg by mouth daily.   Tecfidera 240 MG Cpdr Generic drug: Dimethyl Fumarate Take 240 mg by mouth 2 (two) times daily.   vitamin B-12 1000 MCG tablet Commonly known as: CYANOCOBALAMIN Take 1,000 mcg by mouth daily.   Vitamin D-3 25 MCG (1000 UT) Caps Take 1,000 Units by mouth daily.       Allergies:  Allergies  Allergen Reactions  . Sulfa Antibiotics Nausea And  Vomiting  . Tetanus Toxoids Swelling    Extreme swelling at injection site    Family History: Family History  Problem Relation Age of Onset  . Seizures Mother   . Emphysema Father   . Heart disease Brother   . Kidney disease Neg Hx   . Prostate cancer Neg Hx   . Bladder Cancer Neg Hx     Social History:  reports that she has never smoked. She has never used smokeless tobacco. She reports current alcohol use. She reports that she does not use drugs.  ROS: Pertinent of review of system and history of present illness.    Physical Exam: Constitutional:  Well nourished. Alert and oriented, No acute distress. HEENT: [place] AT, mask in place.  Trachea midline, no masses. Cardiovascular: No clubbing, cyanosis, or edema. Respiratory: Normal respiratory effort, no increased work of breathing. Neurologic: Grossly intact, no focal deficits, moving all 4 extremities. Psychiatric: Normal mood and affect.   Laboratory Data: No recent labs I have reviewed the labs.  Cath Change/ Replacement Patient is present today for a catheter change due to urinary retention.  10 ml of water was removed from the balloon, a 16 FR foley cath was removed with out difficulty.  Patient was cleaned and prepped in a sterile fashion with betadine. A 16 FR foley cath was replaced into the bladder no complications were noted  Urine return was noted 50 ml and urine was yellow in color. The balloon was filled with 80ml of sterile water.  We instilled 30 mL of Renacidin into the Foley.  Patient experienced leakage around the catheter, but she was bearing down.  A leg bag was attached for drainage.  A night bag was also given to the patient and patient was given instruction on how to change from one bag to another. Patient was given proper instruction on catheter care.    Performed by: Zara Council, PA-C and Kyra Manges, CME   Assessment & Plan:    1. Neurogenic bladder Continue with monthly Foley catheter  exchanges RTC in one month for Foley exchange  2. Bladder spasms Continue the Myrbetriq 50 mg daily for spasms Prescribed Renacidin to irrigate twice daily through the catheter.  Advised patient to allow her husband to instill the right inside into the bladder so that she may lay supine and not cause abdominal muscle contraction which I feel is contributing to the leakage around the catheter.  3. Nocturia Foley in place   Return in about 1 month (around 03/09/2020) for Foley exchange .  These notes generated with voice recognition software. I apologize for typographical errors.  Zara Council, PA-C  [facility] [address]  [place],  [address] [phone]

## 2020-02-17 ENCOUNTER — Telehealth: Payer: Self-pay | Admitting: Urology

## 2020-02-17 NOTE — Telephone Encounter (Signed)
Spoke to patient and she is going to pick up the stickers for her catheter.

## 2020-02-17 NOTE — Telephone Encounter (Signed)
Patient called the office today requesting the sticky patch that is used for catheters.  She would like to pick up 2 of them in the [facility] office on 02/24/2020.  Please call her if we do not have them available.

## 2020-03-02 DIAGNOSIS — R2681 Unsteadiness on feet: Secondary | ICD-10-CM | POA: Insufficient documentation

## 2020-03-09 ENCOUNTER — Ambulatory Visit: Payer: Medicare HMO | Admitting: Urology

## 2020-03-09 ENCOUNTER — Other Ambulatory Visit: Payer: Self-pay

## 2020-03-09 ENCOUNTER — Encounter: Payer: Self-pay | Admitting: Urology

## 2020-03-09 DIAGNOSIS — N319 Neuromuscular dysfunction of bladder, unspecified: Secondary | ICD-10-CM

## 2020-03-09 NOTE — Progress Notes (Signed)
Cath Change/ Replacement Patient is present today for a catheter change due to urinary retention.  10 ml of water was removed from the balloon, a 16 FR foley cath was removed with out difficulty.  Patient was cleaned and prepped in a sterile fashion with betadine. A 16FR foley cath was replaced into the bladder no complications were noted Urine return was noted 50 ml and urine was yellow clear in color. The balloon was filled with 28ml of sterile water. A leg bag was attached for drainage.  Patient was given proper instruction on catheter care.    Performed by: Harle Battiest. PA-C and Honor Loh, CME  Follow up: One month for Foley catheter exchange

## 2020-04-06 ENCOUNTER — Encounter: Payer: Self-pay | Admitting: Urology

## 2020-04-06 ENCOUNTER — Other Ambulatory Visit: Payer: Self-pay

## 2020-04-06 ENCOUNTER — Ambulatory Visit: Payer: Medicare HMO | Admitting: Urology

## 2020-04-06 DIAGNOSIS — N319 Neuromuscular dysfunction of bladder, unspecified: Secondary | ICD-10-CM

## 2020-04-06 DIAGNOSIS — Z978 Presence of other specified devices: Secondary | ICD-10-CM

## 2020-04-06 NOTE — Progress Notes (Signed)
Cath Change/ Replacement Patient is present today for a catheter change due to urinary retention.  10 ml of water was removed from the balloon, a 16 FR foley cath was removed with out difficulty.  Patient was cleaned and prepped in a sterile fashion with betadine. A 16 FR foley cath was replaced into the bladder no complications were noted.  Urine return was noted 30 ml and urine was clear yellow in color. The balloon was filled with 87ml of sterile water. A leg bag was attached for drainage.  A night bag was also given to the patient and patient was given instruction on how to change from one bag to another. Patient was given proper instruction on catheter care.    Performed by: Harle Battiest, PA-C and Honor Loh, CMA  Follow up: One month for catheter exchange and RUS and cysto with Dr. Apolinar Junes in 08/2020

## 2020-04-27 ENCOUNTER — Telehealth: Payer: Self-pay | Admitting: Urology

## 2020-04-27 NOTE — Telephone Encounter (Signed)
Spoke to patient and she states her insurance company said she is in the donut hole. I gave her instruction on using the vinegar and water to irrigate catheter. She will try this.

## 2020-04-27 NOTE — Telephone Encounter (Signed)
Patient called the office today requesting a change in her prescription for RENACIDIN.  She went to the [facility] in [place] and the cost went from $9 to $90.    She is wanting to know if there is an alternate medication that she could try.  Please advise.

## 2020-04-30 ENCOUNTER — Ambulatory Visit: Payer: Medicare HMO | Admitting: Pharmacy Technician

## 2020-04-30 ENCOUNTER — Other Ambulatory Visit: Payer: Self-pay

## 2020-04-30 DIAGNOSIS — Z79899 Other long term (current) drug therapy: Secondary | ICD-10-CM

## 2020-04-30 NOTE — Progress Notes (Signed)
  Completed [facility] application and contract.  Patient agreed to all terms of the [facility] contract.  Patient stated that she is in Medicare Part D coverage gap.    Patient looking for assistance with baclofen, vitamin D3, donepezil, namenda, flonase, nortrel, oxycodone, pantoprazole, senekot, vitamin b-12 and linzess.  Explained to patient that [facility] could only provide assistance with namenda and linzess because they are available through [employer] PAP.  Other medications such as vitamins, senekot, flonase and pantoprazole available otc.  [facility] cannot provide assistance with oxycodone, because it is a controlled substance.  Also, explained to patient that [facility] obtains baclofen from [facility]. [facility] doesn't allow filling of their medications for Medicare patients.  The cost of the donepezil is cost prohibited for our clinic unable to provide medication assistance for this medication as well.    Mailing application, contract along with PAP applications for Linzess and Namenda to patient to sign and return to [facility].  Provider portion of Namenda PAP application sent to Dr. Malvin Johns for signature and provider portion of Linzess PAP application sent to [facility] for signature.  Patient to provide EOB and checking account statement, Social Security award letter, pharmacy printout for all medications purchased during calendar year.    Sherilyn Dacosta Care Manager [facility]

## 2020-05-11 ENCOUNTER — Other Ambulatory Visit: Payer: Self-pay

## 2020-05-11 ENCOUNTER — Ambulatory Visit: Payer: Medicare HMO | Admitting: Urology

## 2020-05-11 ENCOUNTER — Encounter: Payer: Self-pay | Admitting: Urology

## 2020-05-11 VITALS — BP 145/75 | HR 98 | Ht 65.0 in | Wt 190.0 lb

## 2020-05-11 DIAGNOSIS — N319 Neuromuscular dysfunction of bladder, unspecified: Secondary | ICD-10-CM

## 2020-05-11 NOTE — Progress Notes (Signed)
Cath Change/ Replacement  Patient is present today for a catheter change due to urinary retention.  10 ml of water was removed from the balloon, a 16 FR foley cath was removed with out difficulty.  Patient was cleaned and prepped in a sterile fashion with betadine. A 16 FR foley cath was replaced into the bladder no complications were noted Urine return was noted 200 ml and urine was yellow in color. The balloon was filled with 80ml of sterile water. A leg bag was attached for drainage.  A night bag was also given to the patient and patient was given instruction on how to change from one bag to another. Patient was given proper instruction on catheter care.    Performed by: Michiel Cowboy, PA-C   Follow up: One month for catheter exchange and RUS and cysto with Dr. Apolinar Junes in 08/2020

## 2020-05-19 DIAGNOSIS — R29898 Other symptoms and signs involving the musculoskeletal system: Secondary | ICD-10-CM | POA: Insufficient documentation

## 2020-05-19 DIAGNOSIS — R200 Anesthesia of skin: Secondary | ICD-10-CM | POA: Insufficient documentation

## 2020-05-20 ENCOUNTER — Other Ambulatory Visit: Payer: Self-pay | Admitting: Neurology

## 2020-05-20 DIAGNOSIS — M5416 Radiculopathy, lumbar region: Secondary | ICD-10-CM

## 2020-05-21 ENCOUNTER — Emergency Department
Admission: EM | Admit: 2020-05-21 | Discharge: 2020-05-21 | Disposition: A | Discharge status: Home / Self Care | Payer: Medicare HMO | Attending: Emergency Medicine | Admitting: Emergency Medicine

## 2020-05-21 ENCOUNTER — Encounter: Payer: Self-pay | Admitting: Emergency Medicine

## 2020-05-21 ENCOUNTER — Other Ambulatory Visit: Payer: Self-pay

## 2020-05-21 DIAGNOSIS — G35 Multiple sclerosis: Secondary | ICD-10-CM | POA: Diagnosis not present

## 2020-05-21 DIAGNOSIS — Z79899 Other long term (current) drug therapy: Secondary | ICD-10-CM | POA: Diagnosis not present

## 2020-05-21 DIAGNOSIS — R531 Weakness: Secondary | ICD-10-CM | POA: Diagnosis present

## 2020-05-21 LAB — URINALYSIS, COMPLETE (UACMP) WITH MICROSCOPIC
Bacteria, UA: NONE SEEN
Bilirubin Urine: NEGATIVE
Glucose, UA: NEGATIVE mg/dL
Ketones, ur: NEGATIVE mg/dL
Nitrite: NEGATIVE
Protein, ur: NEGATIVE mg/dL
Specific Gravity, Urine: 1.001 — ABNORMAL LOW (ref 1.005–1.030)
Squamous Epithelial / LPF: NONE SEEN (ref 0–5)
pH: 6 (ref 5.0–8.0)

## 2020-05-21 LAB — CBC WITH DIFFERENTIAL/PLATELET
Abs Immature Granulocytes: 0.1 10*3/uL — ABNORMAL HIGH (ref 0.00–0.07)
Basophils Absolute: 0.1 10*3/uL (ref 0.0–0.1)
Basophils Relative: 1 %
Eosinophils Absolute: 0 10*3/uL (ref 0.0–0.5)
Eosinophils Relative: 0 %
HCT: 40.9 % (ref 36.0–46.0)
Hemoglobin: 13.9 g/dL (ref 12.0–15.0)
Immature Granulocytes: 1 %
Lymphocytes Relative: 4 %
Lymphs Abs: 0.5 10*3/uL — ABNORMAL LOW (ref 0.7–4.0)
MCH: 30 pg (ref 26.0–34.0)
MCHC: 34 g/dL (ref 30.0–36.0)
MCV: 88.1 fL (ref 80.0–100.0)
Monocytes Absolute: 0.7 10*3/uL (ref 0.1–1.0)
Monocytes Relative: 5 %
Neutro Abs: 12.3 10*3/uL — ABNORMAL HIGH (ref 1.7–7.7)
Neutrophils Relative %: 89 %
Platelets: 267 10*3/uL (ref 150–400)
RBC: 4.64 MIL/uL (ref 3.87–5.11)
RDW: 13.8 % (ref 11.5–15.5)
WBC: 13.7 10*3/uL — ABNORMAL HIGH (ref 4.0–10.5)
nRBC: 0 % (ref 0.0–0.2)

## 2020-05-21 LAB — BASIC METABOLIC PANEL
Anion gap: 9 (ref 5–15)
BUN: 15 mg/dL (ref 6–20)
CO2: 21 mmol/L — ABNORMAL LOW (ref 22–32)
Calcium: 8.8 mg/dL — ABNORMAL LOW (ref 8.9–10.3)
Chloride: 105 mmol/L (ref 98–111)
Creatinine, Ser: 0.86 mg/dL (ref 0.44–1.00)
GFR calc Af Amer: >60 mL/min (ref >60)
GFR calc non Af Amer: >60 mL/min (ref >60)
Glucose, Bld: 124 mg/dL — ABNORMAL HIGH (ref 70–99)
Potassium: 3.7 mmol/L (ref 3.5–5.1)
Sodium: 135 mmol/L (ref 135–145)

## 2020-05-21 LAB — LACTIC ACID, PLASMA: Lactic Acid, Venous: <0.3 mmol/L — ABNORMAL LOW (ref 0.5–1.9)

## 2020-05-21 LAB — TROPONIN I (HIGH SENSITIVITY)
Troponin I (High Sensitivity): 7 ng/L (ref <18)
Troponin I (High Sensitivity): 5 ng/L (ref <18)

## 2020-05-21 MED ORDER — SODIUM CHLORIDE 0.9 % IV BOLUS
500.0000 mL | Freq: Once | INTRAVENOUS | Status: AC
  Administered 2020-05-21: 500 mL via INTRAVENOUS

## 2020-05-21 NOTE — ED Notes (Signed)
Pt given meal tray and water. Husband at bedside

## 2020-05-21 NOTE — Discharge Instructions (Addendum)
Please seek medical attention for any high fevers, chest pain, shortness of breath, change in behavior, persistent vomiting, bloody stool or any other new or concerning symptoms.  

## 2020-05-21 NOTE — ED Provider Notes (Signed)
[facility] Emergency Department Provider Note   ____________________________________________   I have reviewed the triage vital signs and the nursing notes.   HISTORY  Chief Complaint Weakness  History limited by: Not Limited   HPI Adrienne Strickland is a 59 y.o. female who presents to the emergency department today with primary concern for lower extremity weakness.  Patient states she has a history of multiple sclerosis.  Earlier in the day she was able to move using her walker.  She then sat down and after some time realize she could not get back up again.  Patient felt like both of her legs were dead weights.  Patient has recently been having problems with her right leg.  She states she was told she has sciatica and it now would occasionally give way.  Patient did receive her second Covid vaccine yesterday.  Has other complaints of diarrhea but denies any nausea or vomiting.  Denies any chest pain or palpitations.   Records reviewed. Per medical record review patient has a history of MS  Past Medical History:  Diagnosis Date  . Abnormality of gait 09/26/2013  . Arthritis   . Duodenal ulcer 02/27/2015  . Memory difficulty   . Multiple sclerosis (HCC) 09/26/2013  . Multiple sclerosis (HCC)   . Neurogenic bladder    self caths  . Self-catheterizes urinary bladder   . Urinary frequency     Patient Active Problem List   Diagnosis Date Noted  . Constipation 04/29/2016  . Seasonal allergic rhinitis due to pollen 03/22/2016  . Pain in both lower legs 01/14/2016  . Urinary frequency 10/01/2015  . Neurogenic bladder 10/01/2015  . Chronic pain syndrome 06/18/2015  . Duodenal ulcer with hemorrhage 04/24/2015  . Pain, dental 04/24/2015  . Status post tooth extraction 04/24/2015  . Arthritis 01/20/2015  . Extremity pain 05/20/2014  . Multiple sclerosis (HCC) 09/26/2013  . Abnormality of gait 09/26/2013  . Dysuria 04/19/2013  . Mild cognitive impairment  04/19/2013    Past Surgical History:  Procedure Laterality Date  . BOTOX INJECTION N/A 12/13/2017   Procedure: BOTOX INJECTION;  Surgeon: Vanna Scotland, MD;  Location: [facility] ORS;  Service: Urology;  Laterality: N/A;  . CHOLECYSTECTOMY    . COLONOSCOPY WITH PROPOFOL N/A 05/22/2018   Procedure: COLONOSCOPY WITH PROPOFOL;  Surgeon: Christena Deem, MD;  Location: [facility] ENDOSCOPY;  Service: Endoscopy;  Laterality: N/A;  . CYSTOSCOPY N/A 12/13/2017   Procedure: CYSTOSCOPY;  Surgeon: Vanna Scotland, MD;  Location: [facility] ORS;  Service: Urology;  Laterality: N/A;  . GALLBLADDER SURGERY      Prior to Admission medications   Medication Sig Start Date End Date Taking? Authorizing Provider  baclofen (LIORESAL) 10 MG tablet Take 10 mg by mouth 2 (two) times daily.     [provider]  Cholecalciferol (VITAMIN D-3) 1000 UNITS CAPS Take 1,000 Units by mouth daily.     [provider]  dalfampridine (AMPYRA) 10 MG TB12 Take 10 mg by mouth 2 (two) times daily.    [provider]  donepezil (ARICEPT) 10 MG tablet Take 10 mg by mouth at bedtime.     [provider]  fluticasone (FLONASE) 50 MCG/ACT nasal spray Place 2 sprays into both nostrils daily. 08/08/15   Domenick Gong, MD  gluconic acid-citric acid (RENACIDIN) irrigation Insert 30 mL into suprapubic tube and clamp tube for 10 minutes and then drain twice daily 02/10/20   Marvel Plan, Wellington Hampshire, PA-C  LINZESS 145 MCG CAPS capsule Take 145  mcg by mouth daily. 11/08/19   [provider]  memantine (NAMENDA) 10 MG tablet Take 10 mg by mouth 2 (two) times daily.    [provider]  mirabegron ER (MYRBETRIQ) 50 MG TB24 tablet Take 1 tablet (50 mg total) by mouth daily. 12/24/19   Zara Council A, PA-C  norethindrone-ethinyl estradiol 1/35 (NORTREL 1/35, 28,) tablet Take 1 tablet by mouth daily.  01/21/15   [provider]  oxycodone (OXY-IR) 5 MG capsule Take 5 mg by mouth every 6 (six) hours.      [provider]  pantoprazole (PROTONIX) 40 MG tablet Take 40 mg by mouth daily.    [provider]  vitamin B-12 (CYANOCOBALAMIN) 1000 MCG tablet Take 1,000 mcg by mouth daily.    [provider]    Allergies Sulfa antibiotics and Tetanus toxoids  Family History  Problem Relation Age of Onset  . Seizures Mother   . Emphysema Father   . Heart disease Brother   . Kidney disease Neg Hx   . Prostate cancer Neg Hx   . Bladder Cancer Neg Hx     Social History Social History   Tobacco Use  . Smoking status: Never Smoker  . Smokeless tobacco: Never Used  Substance Use Topics  . Alcohol use: Yes    Alcohol/week: 0.0 standard drinks    Comment: drinks everyday 2 beers  . Drug use: No    Review of Systems Constitutional: No fever/chills Eyes: No visual changes. ENT: No sore throat. Cardiovascular: Denies chest pain. Respiratory: Denies shortness of breath. Gastrointestinal: No abdominal pain.  No nausea, no vomiting.  Positive for diarrhea.  Genitourinary: Negative for dysuria. Musculoskeletal: Negative for back pain. Skin: Negative for rash. Neurological: Positive for lower extremity weakness. ____________________________________________   PHYSICAL EXAM:  VITAL SIGNS: ED Triage Vitals  Enc Vitals Group     BP 05/21/20 1420 (!) 124/56     Pulse Rate 05/21/20 1420 (!) 105     Resp 05/21/20 1420 20     Temp 05/21/20 1420 99.3 F (37.4 C)     Temp src --      SpO2 05/21/20 1420 96 %     Weight 05/21/20 1422 200 lb (90.7 kg)     Height 05/21/20 1422 5\' 5"  (1.651 m)     Head Circumference --      Peak Flow --      Pain Score 05/21/20 1422 0   Constitutional: Alert and oriented.  Eyes: Conjunctivae are normal.  ENT      Head: Normocephalic and atraumatic.      Nose: No congestion/rhinnorhea.      Mouth/Throat: Mucous membranes are moist.      Neck: No stridor. Hematological/Lymphatic/Immunilogical: No cervical  lymphadenopathy. Cardiovascular: Normal rate, regular rhythm.  No murmurs, rubs, or gallops.  Respiratory: Normal respiratory effort without tachypnea nor retractions. Breath sounds are clear and equal bilaterally. No wheezes/rales/rhonchi. Gastrointestinal: Soft and non tender. No rebound. No guarding.  Genitourinary: Deferred Musculoskeletal: Normal range of motion in all extremities. No lower extremity edema. Neurologic:  Normal speech and language.  Skin:  Skin is warm, dry and intact. No rash noted. Psychiatric: Mood and affect are normal. Speech and behavior are normal. Patient exhibits appropriate insight and judgment.  ____________________________________________    LABS (pertinent positives/negatives)  Trop hs 7 > 5 UA turbid, moderate hgb dipstick, moderate leukocytes Lactic <0.3 BMP wnl except co2 21, glu 124, ca 8.8 CBC wbc 13.7, hgb 13.9, plt 267  ____________________________________________   EKG  Lurline Idol, attending physician, personally viewed and interpreted this EKG  EKG Time: 1451 Rate: 87 Rhythm: sinus rhythm Axis: normal Intervals: qtc 447 QRS: narrow ST changes: no st elevation Impression: normal ekg   ____________________________________________    RADIOLOGY  None  ____________________________________________   PROCEDURES  Procedures  ____________________________________________   INITIAL IMPRESSION / ASSESSMENT AND PLAN / ED COURSE  Pertinent labs & imaging results that were available during my care of the patient were reviewed by me and considered in my medical decision making (see chart for details).   Patient presented to the emergency department today because of concerns for weakness.  Patient was found to have a fever by EMS.  She did receive her Covid shot yesterday.  Blood work here showed a very mild leukocytosis but negative lactic acidosis.  Patient did feel improvement after IV fluids.  At this point I do think a  lot of her symptoms could be vaccine related.  She is already initiating work-up for her right leg sciatica.  Discussed with patient portance of continued follow-up for that issue.  ____________________________________________   FINAL CLINICAL IMPRESSION(S) / ED DIAGNOSES  Final diagnoses:  Weakness     Note: This dictation was prepared with Dragon dictation. Any transcriptional errors that result from this process are unintentional     Phineas Semen, MD 05/21/20 1859

## 2020-05-21 NOTE — ED Notes (Signed)
Pt an husband verbalized understanding of d/c instructions and had no further questions for this RN  Signature pad malfunction

## 2020-05-21 NOTE — ED Triage Notes (Signed)
Patient to EMS from home for c/o multiple falls. Patient states she has fallen x4 since Monday. Patient reports episode of diarrhea as well and was unable to get up to clean self up. Patient has h/o MS, but states this amount of falls has been abnormal for her (has been once a week for last 2 months). Denies any LOC. Per EMS, patient had fever of 102.1. 975mg  Tylenol given. Vitals per EMS: 134/74, 110 HR, 98% RA. Patient received 2nd Covid shot yesterday.

## 2020-05-27 DIAGNOSIS — R296 Repeated falls: Secondary | ICD-10-CM | POA: Insufficient documentation

## 2020-06-04 ENCOUNTER — Other Ambulatory Visit: Payer: Self-pay

## 2020-06-04 ENCOUNTER — Ambulatory Visit
Admission: RE | Admit: 2020-06-04 | Discharge: 2020-06-04 | Disposition: A | Discharge status: Home / Self Care | Payer: Medicare HMO | Source: Ambulatory Visit | Attending: Neurology | Admitting: Neurology

## 2020-06-04 DIAGNOSIS — M5416 Radiculopathy, lumbar region: Secondary | ICD-10-CM | POA: Diagnosis present

## 2020-06-14 NOTE — Progress Notes (Signed)
Cath Change/ Replacement  Patient is present today for a catheter change due to urinary retention.  8 ml of water was removed from the balloon, a 16 FR silicone foley cath was removed with out difficulty.  Patient was cleaned and prepped in a sterile fashion with betadine. A 16 FR foley cath was replaced into the bladder no complications were noted Urine return was noted 20 ml and urine was yellow clear in color. The balloon was filled with 3ml of sterile water. A leg bag was attached for drainage.  A night bag was also given to the patient and patient was given instruction on how to change from one bag to another. Patient was given proper instruction on catheter care.    Performed by: Harle Battiest, PA-C  Follow up: One month for catheter exchange.  A RUS and cysto with Dr. Apolinar Junes in 08/2020

## 2020-06-15 ENCOUNTER — Other Ambulatory Visit: Payer: Self-pay

## 2020-06-15 ENCOUNTER — Telehealth: Payer: Self-pay | Admitting: Pharmacy Technician

## 2020-06-15 ENCOUNTER — Ambulatory Visit: Payer: Medicare HMO | Admitting: Urology

## 2020-06-15 ENCOUNTER — Encounter: Payer: Self-pay | Admitting: Urology

## 2020-06-15 VITALS — BP 112/69 | HR 88 | Ht 65.0 in | Wt 193.0 lb

## 2020-06-15 DIAGNOSIS — N319 Neuromuscular dysfunction of bladder, unspecified: Secondary | ICD-10-CM

## 2020-06-15 NOTE — Telephone Encounter (Signed)
Received updated proof of income.  Patient eligible to receive medication assistance at [facility] for medications obtained from the [employer] Patient Assistance programs through 12/25/20.  Patient understands that medication assistance ceases when her Medicare Part D plan starts over on 12/26/20.    Sherilyn Dacosta Care Manager [facility]

## 2020-06-17 ENCOUNTER — Telehealth: Payer: Self-pay | Admitting: *Deleted

## 2020-06-17 NOTE — Telephone Encounter (Signed)
-----   Message from Sueanne Margarita, [place] sent at 06/15/2020  8:49 AM EDT ----- Mail pt elastic leg bag

## 2020-06-17 NOTE — Telephone Encounter (Signed)
Elastic band mailed to pt's home address

## 2020-06-23 DIAGNOSIS — G8929 Other chronic pain: Secondary | ICD-10-CM | POA: Insufficient documentation

## 2020-06-23 DIAGNOSIS — M48061 Spinal stenosis, lumbar region without neurogenic claudication: Secondary | ICD-10-CM | POA: Insufficient documentation

## 2020-06-25 DIAGNOSIS — M79604 Pain in right leg: Secondary | ICD-10-CM | POA: Insufficient documentation

## 2020-06-25 DIAGNOSIS — M5416 Radiculopathy, lumbar region: Secondary | ICD-10-CM | POA: Insufficient documentation

## 2020-07-19 NOTE — Progress Notes (Signed)
Cath Change/ Replacement Patient is present today for a catheter change due to urinary retention.  8 ml of water was removed from the balloon, a 16 FR foley cath was removed with out difficulty.  Patient was cleaned and prepped in a sterile fashion with betadine. A 16 FR foley cath was replaced into the bladder no complications were noted Urine return was noted 20 ml and urine was yellow clear in color. The balloon was filled with 7ml of sterile water. A leg bag was attached for drainage.  A night bag was also given to the patient and patient was given instruction on how to change from one bag to another. Patient was given proper instruction on catheter care.    Performed by: Michiel Cowboy, PA-C   Follow up: One month.  Will need RUS and cysto in 08/2020 with Dr. Apolinar Junes

## 2020-07-20 ENCOUNTER — Other Ambulatory Visit: Payer: Self-pay

## 2020-07-20 ENCOUNTER — Ambulatory Visit: Payer: Medicare HMO | Admitting: Urology

## 2020-07-20 ENCOUNTER — Encounter: Payer: Self-pay | Admitting: Urology

## 2020-07-20 VITALS — BP 138/72 | HR 74 | Ht 65.0 in | Wt 196.0 lb

## 2020-07-20 DIAGNOSIS — N319 Neuromuscular dysfunction of bladder, unspecified: Secondary | ICD-10-CM | POA: Diagnosis not present

## 2020-07-23 ENCOUNTER — Ambulatory Visit
Admission: RE | Admit: 2020-07-23 | Discharge: 2020-07-23 | Disposition: A | Discharge status: Home / Self Care | Payer: Medicare HMO | Source: Ambulatory Visit | Attending: Neurosurgery | Admitting: Neurosurgery

## 2020-07-23 ENCOUNTER — Other Ambulatory Visit: Payer: Self-pay | Admitting: Neurosurgery

## 2020-07-23 DIAGNOSIS — M419 Scoliosis, unspecified: Secondary | ICD-10-CM

## 2020-07-27 ENCOUNTER — Other Ambulatory Visit: Payer: Self-pay

## 2020-07-27 ENCOUNTER — Ambulatory Visit: Payer: Medicare HMO | Admitting: Urology

## 2020-07-27 ENCOUNTER — Encounter: Payer: Self-pay | Admitting: Urology

## 2020-07-27 VITALS — BP 138/72 | HR 74 | Ht 65.0 in | Wt 196.0 lb

## 2020-07-27 DIAGNOSIS — N319 Neuromuscular dysfunction of bladder, unspecified: Secondary | ICD-10-CM

## 2020-07-27 NOTE — Progress Notes (Signed)
Simple Catheter Placement  Due to catheter coming out, patient is present today for a foley cath placement.  She states that she had a forceful bowel movement and the catheter came out with balloon intact.  Patient was cleaned and prepped in a sterile fashion with betadine. A 16 FR foley catheter was inserted, urine return was noted  50 ml, urine was yellow clear in color.  The balloon was filled with 10cc of sterile water.  A leg bag was attached for drainage. Patient was also given a night bag to take home and was given instruction on how to change from one bag to another.  Patient was given instruction on proper catheter care.  Patient tolerated well, no complications were noted   Performed by: Michiel Cowboy, PA-C  Additional notes/ Follow up: Return in one month with Dr. Apolinar Junes for a RUS report and cystoscopy

## 2020-08-17 ENCOUNTER — Ambulatory Visit: Payer: Medicare HMO | Admitting: Urology

## 2020-08-21 ENCOUNTER — Other Ambulatory Visit: Payer: Self-pay

## 2020-08-21 ENCOUNTER — Ambulatory Visit
Admission: RE | Admit: 2020-08-21 | Discharge: 2020-08-21 | Disposition: A | Discharge status: Home / Self Care | Payer: Medicare HMO | Source: Ambulatory Visit | Attending: Urology | Admitting: Urology

## 2020-08-21 DIAGNOSIS — N319 Neuromuscular dysfunction of bladder, unspecified: Secondary | ICD-10-CM | POA: Insufficient documentation

## 2020-08-27 NOTE — Progress Notes (Signed)
   08/28/2020  CC:  Chief Complaint  Patient presents with  . Cysto    w/RUS    HPI: Adrienne Strickland is a 59 y.o. female who returns for a cystoscopy and review of renal ultrasound.   She had Botox injected into her bladder on 12/13/2017.  She stated that she felt the Botox did not help her as much as she would of hoped for.  A Foley catheter was placed on 09/09/2019 and has been changed on a monthly basis in our office.  KUB 12/16/2019 no stones seen.    Michiel Cowboy, PA-C prescribed Renacidin to irrigate twice daily through the catheter on 02/10/2020.   The patient had a simple catheter placement on 07/27/2020 with Michiel Cowboy, PA-C.  RUS on 08/21/2020 revealed unremarkable appearance of the bilateral kidneys and bladder.   Blood pressure 133/71, pulse 88. NED. A&Ox3.   No respiratory distress   Abd soft, NT, ND Normal external genitalia with patent urethral meatus  Cystoscopy Procedure Note  Patient identification was confirmed, informed consent was obtained, and patient was prepped using Betadine solution.  Lidocaine jelly was administered per urethral meatus.    Procedure: - Flexible cystoscope introduced, without any difficulty.   - Thorough search of the bladder revealed:    normal urethral meatus    Catheter cystitis appreciated with mild debris    no stones    no ulcers     no tumors  - Ureteral orifices were normal in position and appearance.  Post-Procedure: - Patient tolerated the procedure well.  Several bladder spasms during cysto.  Pertinent image: CLINICAL DATA:  Neurogenic bladder  EXAM: RENAL / URINARY TRACT ULTRASOUND COMPLETE  COMPARISON:  None.  FINDINGS: Right Kidney:  Renal measurements: 11.0 x 4.0 x 4.9 cm = volume: 112 mL. Echogenicity within normal limits. No mass or hydronephrosis visualized.  Left Kidney:  Renal measurements: 11.5 x 4.8 x 4.7 cm = volume: 135 mL. Echogenicity within normal limits. No mass or  hydronephrosis visualized.  Bladder:  Decompressed with Foley catheter in place.  Other:  None.  IMPRESSION: Unremarkable appearance of the bilateral kidneys and bladder.   Electronically Signed   By: Emmaline Kluver M.D.   On: 08/21/2020 15:46   I have personally reviewed the images and agree with radiologist interpretation.    Assessment/ Plan:  1. Neurogenic bladder Managed by chronic indwelling foley  Discussed changing suprapubic catheter. Patient agreed and we will discussed this further at next appointment. Will continue cystoscopy and RUS annually.  Keflex x 500 mg given today ppx  I, Adrienne Strickland, am acting as a scribe for Dr. Vanna Scotland.  I have reviewed the above documentation for accuracy and completeness, and I agree with the above.   Vanna Scotland, MD

## 2020-08-28 ENCOUNTER — Encounter: Payer: Self-pay | Admitting: Urology

## 2020-08-28 ENCOUNTER — Ambulatory Visit: Payer: Medicare HMO | Admitting: Urology

## 2020-08-28 ENCOUNTER — Other Ambulatory Visit: Payer: Self-pay

## 2020-08-28 ENCOUNTER — Other Ambulatory Visit: Payer: Self-pay | Admitting: Radiology

## 2020-08-28 VITALS — BP 133/71 | HR 88

## 2020-08-28 DIAGNOSIS — N319 Neuromuscular dysfunction of bladder, unspecified: Secondary | ICD-10-CM

## 2020-08-28 DIAGNOSIS — R339 Retention of urine, unspecified: Secondary | ICD-10-CM

## 2020-08-28 MED ORDER — CEPHALEXIN 250 MG PO CAPS
500.0000 mg | ORAL_CAPSULE | Freq: Once | ORAL | Status: AC
  Administered 2020-08-28: 500 mg via ORAL

## 2020-08-28 NOTE — Progress Notes (Signed)
Cath Change/ Replacement  Patient is present today for a catheter change due to urinary retention.  68ml of water was removed from the balloon, a 16FR foley cath was removed with out difficulty.  Patient was cleaned and prepped in a sterile fashion with betadine. A 16 FR foley cath was replaced into the bladder no complications were noted Urine return was noted 66ml and urine was clear in color. The balloon was filled with 43ml of sterile water. A leg bag was attached for drainage.  A night bag was also given to the patient and patient was given instruction on how to change from one bag to another. Patient was given proper instruction on catheter care.    Performed by: Eligha Bridegroom, CMA

## 2020-09-04 ENCOUNTER — Other Ambulatory Visit: Payer: Self-pay | Admitting: Radiology

## 2020-09-07 ENCOUNTER — Other Ambulatory Visit: Payer: Self-pay | Admitting: Student

## 2020-09-08 ENCOUNTER — Ambulatory Visit
Admission: RE | Admit: 2020-09-08 | Discharge: 2020-09-08 | Disposition: A | Discharge status: Home / Self Care | Payer: Medicare HMO | Source: Ambulatory Visit | Attending: Urology | Admitting: Urology

## 2020-09-08 ENCOUNTER — Ambulatory Visit: Admission: RE | Admit: 2020-09-08 | Payer: Medicare HMO | Source: Ambulatory Visit

## 2020-09-08 ENCOUNTER — Other Ambulatory Visit: Payer: Self-pay

## 2020-09-08 DIAGNOSIS — Z79899 Other long term (current) drug therapy: Secondary | ICD-10-CM | POA: Insufficient documentation

## 2020-09-08 DIAGNOSIS — R339 Retention of urine, unspecified: Secondary | ICD-10-CM

## 2020-09-08 DIAGNOSIS — N319 Neuromuscular dysfunction of bladder, unspecified: Secondary | ICD-10-CM | POA: Insufficient documentation

## 2020-09-08 DIAGNOSIS — G35 Multiple sclerosis: Secondary | ICD-10-CM | POA: Insufficient documentation

## 2020-09-08 LAB — CBC
HCT: 42.4 % (ref 36.0–46.0)
Hemoglobin: 14.3 g/dL (ref 12.0–15.0)
MCH: 29.3 pg (ref 26.0–34.0)
MCHC: 33.7 g/dL (ref 30.0–36.0)
MCV: 86.9 fL (ref 80.0–100.0)
Platelets: 355 10*3/uL (ref 150–400)
RBC: 4.88 MIL/uL (ref 3.87–5.11)
RDW: 12.9 % (ref 11.5–15.5)
WBC: 12.4 10*3/uL — ABNORMAL HIGH (ref 4.0–10.5)
nRBC: 0 % (ref 0.0–0.2)

## 2020-09-08 LAB — PROTIME-INR
INR: 1 (ref 0.8–1.2)
Prothrombin Time: 12.5 seconds (ref 11.4–15.2)

## 2020-09-08 MED ORDER — MIDAZOLAM HCL 2 MG/2ML IJ SOLN
INTRAMUSCULAR | Status: AC | PRN
  Administered 2020-09-08 (×2): 1 mg via INTRAVENOUS

## 2020-09-08 MED ORDER — FENTANYL CITRATE (PF) 100 MCG/2ML IJ SOLN
INTRAMUSCULAR | Status: AC | PRN
  Administered 2020-09-08 (×2): 50 ug via INTRAVENOUS

## 2020-09-08 MED ORDER — MIDAZOLAM HCL 2 MG/2ML IJ SOLN
INTRAMUSCULAR | Status: AC
  Filled 2020-09-08: qty 4

## 2020-09-08 MED ORDER — HYDROCODONE-ACETAMINOPHEN 5-325 MG PO TABS
1.0000 | ORAL_TABLET | ORAL | Status: DC | PRN
  Filled 2020-09-08: qty 2

## 2020-09-08 MED ORDER — FENTANYL CITRATE (PF) 100 MCG/2ML IJ SOLN
INTRAMUSCULAR | Status: AC
  Filled 2020-09-08: qty 4

## 2020-09-08 MED ORDER — SODIUM CHLORIDE 0.9 % IV SOLN: INTRAVENOUS | Status: DC

## 2020-09-08 MED ORDER — CIPROFLOXACIN IN D5W 400 MG/200ML IV SOLN
400.0000 mg | Freq: Once | INTRAVENOUS | Status: AC
  Administered 2020-09-08: 400 mg via INTRAVENOUS
  Filled 2020-09-08: qty 200

## 2020-09-08 MED ORDER — SODIUM CHLORIDE 0.9% FLUSH: 5.0000 mL | Freq: Three times a day (TID) | INTRAVENOUS | Status: DC

## 2020-09-08 NOTE — Sedation Documentation (Signed)
Pt required additional 300 ml of sterile water instilled in bladder. But unable to instill or aspirate from existing floey catheter, removed catheter and replaced with new 16 french. Instill 300 ml sterile water into bladder

## 2020-09-08 NOTE — Discharge Instructions (Signed)
Moderate Conscious Sedation, Adult, Care After These instructions provide you with information about caring for yourself after your procedure. Your health care provider may also give you more specific instructions. Your treatment has been planned according to current medical practices, but problems sometimes occur. Call your health care provider if you have any problems or questions after your procedure. What can I expect after the procedure? After your procedure, it is common:  To feel sleepy for several hours.  To feel clumsy and have poor balance for several hours.  To have poor judgment for several hours.  To vomit if you eat too soon. Follow these instructions at home: For at least 24 hours after the procedure:   Do not: ? Participate in activities where you could fall or become injured. ? Drive. ? Use heavy machinery. ? Drink alcohol. ? Take sleeping pills or medicines that cause drowsiness. ? Make important decisions or sign legal documents. ? Take care of children on your own.  Rest. Eating and drinking  Follow the diet recommended by your health care provider.  If you vomit: ? Drink water, juice, or soup when you can drink without vomiting. ? Make sure you have little or no nausea before eating solid foods. General instructions  Have a responsible adult stay with you until you are awake and alert.  Take over-the-counter and prescription medicines only as told by your health care provider.  If you smoke, do not smoke without supervision.  Keep all follow-up visits as told by your health care provider. This is important. Contact a health care provider if:  You keep feeling nauseous or you keep vomiting.  You feel light-headed.  You develop a rash.  You have a fever. Get help right away if:  You have trouble breathing. This information is not intended to replace advice given to you by your health care provider. Make sure you discuss any questions you have  with your health care provider. Document Revised: 11/24/2017 Document Reviewed: 04/02/2016 Elsevier Patient Education  2020 [employer] Suprapubic Catheter Home Guide A suprapubic catheter is a flexible tube that is used to drain urine from the bladder into a collection bag outside the body. The catheter is inserted into the bladder through a small opening in the lower abdomen, above the pubic bone (suprapubic area) and a few inches below your belly button (navel). A tiny balloon filled with germ-free (sterile) water helps to keep the catheter in place. The collection bag must be emptied at least once a day and cleaned at least every other day. The collection bag can be put beside your bed at night and attached to your leg during the day. You may have a large collection bag to use at night and a smaller one to use during the day. Your suprapubic catheter may need to be changed every 4-6 weeks, or as often as recommended by your health care provider. Healing of the tract where the catheter is placed can take 6 weeks to 6 months. During that time, your health care provider may change your catheter. Once the tract is well healed, you or a caregiver will change your suprapubic catheter at home. What are the risks? This catheter is safe to use. However, problems can occur, including:  Blocked urine flow. This can occur if the catheter stops working, or if you have a blood clot in your bladder or in the catheter.  Irritation of the skin around the catheter.  Infection. This can happen if bacteria gets into  your bladder. Supplies needed:  Two pairs of sterile gloves.  Paper towels.  Catheter.  Two syringes.  Sterile water.  Sterile cleaning solution.  Lubricant.  Collection bags. How to change the catheter  1. Drink plenty of fluids during the hours before you change the catheter. 2. Wash your hands with soap and water. If soap and water are not available, use hand sanitizer. 3. Draw  up sterile water into a syringe to have ready to fill the new catheter balloon. The amount will depend on the size of the balloon. 4. Have all of your supplies ready and close to you on a paper towel. 5. Lie on your back, sitting slightly upright so that you can see the catheter and opening. 6. Put on sterile gloves. 7. Clean the skin around the catheter opening using the sterile cleaning solution. 8. Remove the water from the balloon in the catheter using a syringe. 9. Slowly remove the catheter. If the catheter seems stuck, or if you have difficulty removing it: ? Do not pull on it. ? Call your health care provider right away. 10. Place the old catheter on a paper towel to discard later. 11. Take off the used gloves, and put on a new pair. 12. Put lubricant on the end of the new catheter that will go into your bladder. 13. Clean the skin around the catheter opening using the sterile cleaning solution. 14. Gently slide the catheter through the opening in your abdomen and into the tract that leads to your bladder. 15. Wait for some urine to start flowing through the catheter. 16. When urine starts to flow through the catheter, attach the collection bag to the end of the catheter. Make sure the connection is tight. 17. Use a syringe to fill the catheter balloon with sterile water. Fill to the amount directed by your health care provider. 18. Remove the gloves and wash your hands with soap and water. How to care for the skin around the catheter Follow your health care provider's instructions on caring for your skin.  Use a clean washcloth and soapy water to clean the skin around your catheter every day. Pat the area dry with a clean paper towel.  Do not pull on the catheter.  Do not use ointment or lotion on this area, unless told by your health care provider.  Check the skin around the catheter every day for signs of infection. Check for: ? Redness, swelling, or pain. ? Fluid or  blood. ? Warmth. ? Pus or a bad smell. How to empty and clean the collection bag Empty the large collection bag every 8 hours. Empty the small collection bag when it is about ? full. Clean the collection bag every 2-3 days, or as often as told by your health care provider. To do this: 1. Wash your hands with soap and water. If soap and water are not available, use hand sanitizer. 2. Disconnect the bag from the catheter and immediately attach a new bag to the catheter. 3. Hold the used bag over the toilet or another container. 4. Turn the valve (spigot) at the bottom of the bag to empty the urine. Empty the used bag completely. ? Do not touch the opening of the spigot. ? Do not let the opening touch the toilet or container. 5. Close the spigot tightly when the bag is empty. 6. Clean the used bag in one of the following methods: ? According to the manufacturer's instructions. ? As told by your health  care provider. 7. Let the bag dry completely. Put it in a clean plastic bag before storing it. General tips   Always wash your hands before and after caring for your catheter and collection bag. Use a mild, fragrance-free soap. If soap and water are not available, use hand sanitizer.  Clean the outside of the catheter with soap and water as often as told by your health care provider.  Always make sure there are no twists or kinks in the catheter tube.  Always make sure there are no leaks in the catheter or collection bag.  Always wear the leg bag below your knee.  Make sure the overnight drainage bag is always lower than the level of your bladder, but do not let it touch the floor. Before you go to sleep, hang the bag inside a wastebasket that is covered by a clean plastic bag.  Drink enough fluid to keep your urine pale yellow.  Do not take baths, swim, or use a hot tub until your health care provider approves. Ask your health care provider if you may take showers. Contact a heath care  provider if:  You leak urine.  You have redness, swelling, or pain around your catheter.  You have fluid or blood coming from your catheter opening.  Your catheter opening feels warm to the touch.  You have pus or a bad smell coming from your catheter opening.  You have a fever or chills.  Your urine flow slows down.  Your urine becomes cloudy or smelly. Get help right away if:  Your catheter comes out.  You have: ? Nausea. ? Back pain. ? Difficulty changing your catheter. ? Blood in your urine. ? No urine flow for 1 hour. Summary  A suprapubic catheter is a flexible tube that is used to drain urine from the bladder into a collection bag outside the body.  Your suprapubic catheter may need to be changed every 4-6 weeks, or as recommended by your health care provider.  Follow instructions on how to change the catheter and how to empty and clean the collection bag.  Always wash your hands before and after caring for your catheter and collection bag. Drink enough fluid to keep your urine pale yellow.  Get help right away if you have difficulty changing your catheter or if there is blood in your urine. This information is not intended to replace advice given to you by your health care provider. Make sure you discuss any questions you have with your health care provider. Document Revised: 04/04/2019 Document Reviewed: 01/16/2019 Elsevier Patient Education  2020 [employer].

## 2020-09-08 NOTE — Procedures (Signed)
  Procedure: Suprapubic catheter placement under CT 37f EBL:   minimal Complications:  none immediate  See full dictation in [employer].  Thora Lance MD Main # [phone] Pager  [phone]

## 2020-09-08 NOTE — OR Nursing (Signed)
Pt signing pregnancy waiver, infrequent menstration, on birth control greater than 20 years declining urine pregnancy. IR PA notified and agreeable

## 2020-09-08 NOTE — H&P (Signed)
Chief Complaint: Patient was seen in consultation today for suprapubic catheter placement.  Referring Physician(s): Vanna Scotland  Supervising Physician: Oley Balm  Patient Status: [facility] - Out-pt  History of Present Illness: Adrienne Strickland is a 59 y.o. female with a past medical history significant for arthritis, multiple sclerosis and neurogenic bladder who presents today to have a suprapubic catheter placed. Ms. Corona has had a Foley catheter in place since 09/09/2019 which is changed monthly at her urologist's office, last change 07/27/20. At her last visit with Dr. Apolinar Junes on 08/28/20 suprapubic catheter placement was discussed for long term management of urinary retention which she was agreeable to and presents today to have placed.  [name] reports pain all over which is typical for her and worsened right now because she was not able to take any pain medicine prior to her procedure today. She states that she has some cognitive issues due to her MS but understands everything we're doing today and is agreeable to proceed. She declined urine pregnancy test today - she is sexually active, still menstruating (although not regularly) and is taking oral contraceptives without issues. We discussed use of radiation today during the procedure and potential harm to developing fetus if she were to be pregnant, she states understanding and again declines urine pregnancy test.  Past Medical History:  Diagnosis Date  . Abnormality of gait 09/26/2013  . Arthritis   . Duodenal ulcer 02/27/2015  . Memory difficulty   . Multiple sclerosis (HCC) 09/26/2013  . Multiple sclerosis (HCC)   . Neurogenic bladder    self caths  . Self-catheterizes urinary bladder   . Urinary frequency     Past Surgical History:  Procedure Laterality Date  . BOTOX INJECTION N/A 12/13/2017   Procedure: BOTOX INJECTION;  Surgeon: Vanna Scotland, MD;  Location: [facility] ORS;  Service: Urology;  Laterality: N/A;  .  CHOLECYSTECTOMY    . COLONOSCOPY WITH PROPOFOL N/A 05/22/2018   Procedure: COLONOSCOPY WITH PROPOFOL;  Surgeon: Christena Deem, MD;  Location: [facility] ENDOSCOPY;  Service: Endoscopy;  Laterality: N/A;  . CYSTOSCOPY N/A 12/13/2017   Procedure: CYSTOSCOPY;  Surgeon: Vanna Scotland, MD;  Location: [facility] ORS;  Service: Urology;  Laterality: N/A;  . GALLBLADDER SURGERY      Allergies: Sulfa antibiotics and Tetanus toxoids  Medications: Prior to Admission medications   Medication Sig Start Date End Date Taking? Authorizing Provider  baclofen (LIORESAL) 10 MG tablet Take 10 mg by mouth 2 (two) times daily.     [provider]  Cholecalciferol (VITAMIN D-3) 1000 UNITS CAPS Take 1,000 Units by mouth daily.     [provider]  dalfampridine (AMPYRA) 10 MG TB12 Take 10 mg by mouth 2 (two) times daily.    [provider]  donepezil (ARICEPT) 10 MG tablet Take 10 mg by mouth at bedtime.     [provider]  fluticasone (FLONASE) 50 MCG/ACT nasal spray Place 2 sprays into both nostrils daily. 08/08/15   Domenick Gong, MD  gabapentin (NEURONTIN) 100 MG capsule  08/20/20   [provider]  gluconic acid-citric acid (RENACIDIN) irrigation Insert 30 mL into suprapubic tube and clamp tube for 10 minutes and then drain twice daily 02/10/20   McGowan, Wellington Hampshire, PA-C  LINZESS 145 MCG CAPS capsule Take 145 mcg by mouth daily. 11/08/19   [provider]  memantine (NAMENDA) 10 MG tablet Take 10 mg by mouth 2 (two) times daily. Patient not taking: Reported on 08/28/2020    [provider]  mirabegron ER (MYRBETRIQ) 50 MG TB24 tablet Take 1 tablet (50 mg total) by mouth daily. 12/24/19   Michiel Cowboy A, PA-C  norethindrone-ethinyl estradiol 1/35 (NORTREL 1/35, 28,) tablet Take 1 tablet by mouth daily.  01/21/15   [provider]  oxycodone (OXY-IR) 5 MG capsule Take 5 mg by mouth every 6 (six) hours.     [provider]    pantoprazole (PROTONIX) 40 MG tablet Take 40 mg by mouth daily.    [provider]  vitamin B-12 (CYANOCOBALAMIN) 1000 MCG tablet Take 1,000 mcg by mouth daily.    [provider]     Family History  Problem Relation Age of Onset  . Seizures Mother   . Emphysema Father   . Heart disease Brother   . Kidney disease Neg Hx   . Prostate cancer Neg Hx   . Bladder Cancer Neg Hx     Social History   Socioeconomic History  . Marital status: Single    Spouse name: Not on file  . Number of children: 0  . Years of education: Not on file  . Highest education level: Not on file  Occupational History  . Occupation: unemployed  Tobacco Use  . Smoking status: Never Smoker  . Smokeless tobacco: Never Used  Vaping Use  . Vaping Use: Never used  Substance and Sexual Activity  . Alcohol use: Yes    Alcohol/week: 0.0 standard drinks    Comment: drinks everyday 2 beers  . Drug use: No  . Sexual activity: Not on file  Other Topics Concern  . Not on file  Social History Narrative   Patient consumes 2 cups coffee daily.   Social Determinants of Health   Financial Resource Strain:   . Difficulty of Paying Living Expenses: Not on file  Food Insecurity:   . Worried About Programme researcher, broadcasting/film/video in the Last Year: Not on file  . Ran Out of Food in the Last Year: Not on file  Transportation Needs:   . Lack of Transportation (Medical): Not on file  . Lack of Transportation (Non-Medical): Not on file  Physical Activity:   . Days of Exercise per Week: Not on file  . Minutes of Exercise per Session: Not on file  Stress:   . Feeling of Stress : Not on file  Social Connections:   . Frequency of Communication with Friends and Family: Not on file  . Frequency of Social Gatherings with Friends and Family: Not on file  . Attends Religious Services: Not on file  . Active Member of Clubs or Organizations: Not on file  . Attends Banker Meetings: Not on file  . Marital  Status: Not on file     Review of Systems: A 12 point ROS discussed and pertinent positives are indicated in the HPI above.  All other systems are negative.  Review of Systems  Constitutional: Negative for chills and fever.  Respiratory: Negative for cough and shortness of breath.   Cardiovascular: Negative for chest pain.  Gastrointestinal: Negative for abdominal pain, nausea and vomiting.  Musculoskeletal: Positive for arthralgias, back pain and myalgias.  Neurological: Negative for headaches.    Vital Signs: BP (!) 157/78   Pulse 75   Temp 97.9 F (36.6 C) (Oral)   Resp 18   Ht 5\' 5"  (1.651 m)   Wt 200 lb (90.7 kg)   SpO2 97%   BMI 33.28 kg/m   Physical Exam Vitals reviewed.  Constitutional:  General: She is not in acute distress. HENT:     Head: Normocephalic.  Cardiovascular:     Rate and Rhythm: Normal rate and regular rhythm.  Pulmonary:     Effort: Pulmonary effort is normal.     Breath sounds: Normal breath sounds.  Abdominal:     General: There is no distension.     Palpations: Abdomen is soft.  Skin:    General: Skin is warm and dry.  Neurological:     Mental Status: She is alert and oriented to person, place, and time.  Psychiatric:        Mood and Affect: Mood normal.        Behavior: Behavior normal.        Thought Content: Thought content normal.        Judgment: Judgment normal.      MD Evaluation Airway: WNL Heart: WNL Abdomen: WNL Chest/ Lungs: WNL ASA  Classification: 3 Mallampati/Airway Score: Two   Imaging: US RENAL  Result Date: 08/21/2020 CLINICAL DATA:  Neurogenic bladder EXAM: RENAL / URINARY TRACT ULTRASOUND COMPLETE COMPARISON:  None. FINDINGS: Right Kidney: Renal measurements: 11.0 x 4.0 x 4.9 cm = volume: 112 mL. Echogenicity within normal limits. No mass or hydronephrosis visualized. Left Kidney: Renal measurements: 11.5 x 4.8 x 4.7 cm = volume: 135 mL. Echogenicity within normal limits. No mass or hydronephrosis  visualized. Bladder: Decompressed with Foley catheter in place. Other: None. IMPRESSION: Unremarkable appearance of the bilateral kidneys and bladder. Electronically Signed   By: Emmaline Kluver M.D.   On: 08/21/2020 15:46    Labs:  CBC: Recent Labs    05/21/20 1512  WBC 13.7*  HGB 13.9  HCT 40.9  PLT 267    COAGS: No results for input(s): INR, APTT in the last 8760 hours.  BMP: Recent Labs    05/21/20 1512  NA 135  K 3.7  CL 105  CO2 21*  GLUCOSE 124*  BUN 15  CALCIUM 8.8*  CREATININE 0.86  GFRNONAA >60  GFRAA >60    LIVER FUNCTION TESTS: No results for input(s): BILITOT, AST, ALT, ALKPHOS, PROT, ALBUMIN in the last 8760 hours.  TUMOR MARKERS: No results for input(s): AFPTM, CEA, CA199, CHROMGRNA in the last 8760 hours.  Assessment and Plan:  59 y/o F with history of MS, neurogenic bladder with chronic indwelling Foley catheter who presents today to have a suprapubic catheter placed for long term management of urinary retention.   Patient has been NPO since 5 pm last night, no current anticoagulation/antiplatelet medications Afebrile, WBC 12.4, hgb 14.3, plt 355, INR 1.0, UPT declined - waiver signed.  Risks and benefits discussed with the patient including bleeding, infection, damage to adjacent structures, bowel/bladder perforation/fistula connection, and sepsis.  All of the patient's questions were answered, patient is agreeable to proceed.  Consent signed and in chart.  Thank you for this interesting consult.  I greatly enjoyed meeting MADDY GRAHAM and look forward to participating in their care.  A copy of this report was sent to the requesting provider on this date.  Electronically Signed: Villa Herb, PA-C 09/08/2020, 10:09 AM   I spent a total of 30 Minutes in face to face in clinical consultation, greater than 50% of which was counseling/coordinating care for suprapubic catheter placement.

## 2020-09-14 ENCOUNTER — Telehealth: Payer: Self-pay | Admitting: Radiology

## 2020-09-14 NOTE — Telephone Encounter (Signed)
Spoke with patient she states she did panic thinking she didn't have the right supplies but went through her stuff and realized she did. I advised patient if she needs anything to just call back.

## 2020-09-14 NOTE — Telephone Encounter (Signed)
Patient has questions about catheter care. Please return call at [phone].

## 2020-09-20 NOTE — Progress Notes (Signed)
09/21/2020 9:22 AM   Adrienne Strickland Mar 24, 1961 [id number]  Referring provider: Sharilyn Sites, MD No address on file  Chief Complaint  Patient presents with  . Follow-up    questions after surgery    HPI: Adrienne Strickland is a 59 year old female with MS and a neurogenic bladder who presents today for questions after having a suprapubic catheter placed by IR on 09/08/2020.    She states she is wondering how to care for the suprapubic tube and drainage bag.  It is also tender at the insertion site, with some scant drainage and her urine is pink.  Patient denies any modifying or aggravating factors.  Patient denies any gross hematuria, dysuria or suprapubic/flank pain.  Patient denies any fevers, chills, nausea or vomiting.    PMH: Past Medical History:  Diagnosis Date  . Abnormality of gait 09/26/2013  . Arthritis   . Duodenal ulcer 02/27/2015  . Memory difficulty   . Multiple sclerosis (HCC) 09/26/2013  . Multiple sclerosis (HCC)   . Neurogenic bladder    self caths  . Self-catheterizes urinary bladder   . Urinary frequency     Surgical History: Past Surgical History:  Procedure Laterality Date  . BOTOX INJECTION N/A 12/13/2017   Procedure: BOTOX INJECTION;  Surgeon: Vanna Scotland, MD;  Location: [facility] ORS;  Service: Urology;  Laterality: N/A;  . CHOLECYSTECTOMY    . COLONOSCOPY WITH PROPOFOL N/A 05/22/2018   Procedure: COLONOSCOPY WITH PROPOFOL;  Surgeon: Christena Deem, MD;  Location: [facility] ENDOSCOPY;  Service: Endoscopy;  Laterality: N/A;  . CYSTOSCOPY N/A 12/13/2017   Procedure: CYSTOSCOPY;  Surgeon: Vanna Scotland, MD;  Location: [facility] ORS;  Service: Urology;  Laterality: N/A;  . GALLBLADDER SURGERY      Home Medications:  Allergies as of 09/21/2020      Reactions   Sulfa Antibiotics Nausea And Vomiting   Tetanus Toxoids Swelling   Extreme swelling at injection site      Medication List       Accurate as of September 21, 2020  9:22 AM. If you have  any questions, ask your nurse or doctor.        STOP taking these medications   memantine 10 MG tablet Commonly known as: NAMENDA Stopped by: Michiel Cowboy, PA-C   mirabegron ER 50 MG Tb24 tablet Commonly known as: MYRBETRIQ Stopped by: Kenesha Moshier, PA-C   pantoprazole 40 MG tablet Commonly known as: PROTONIX Stopped by: Lenell Mcconnell, PA-C     TAKE these medications   Ampyra 10 MG Tb12 Generic drug: dalfampridine Take 10 mg by mouth 2 (two) times daily.   baclofen 10 MG tablet Commonly known as: LIORESAL Take 10 mg by mouth 2 (two) times daily.   donepezil 10 MG tablet Commonly known as: ARICEPT Take 10 mg by mouth at bedtime.   fluticasone 50 MCG/ACT nasal spray Commonly known as: FLONASE Place 2 sprays into both nostrils daily.   gabapentin 100 MG capsule Commonly known as: NEURONTIN   gluconic acid-citric acid irrigation Insert 30 mL into suprapubic tube and clamp tube for 10 minutes and then drain twice daily   Linzess 145 MCG Caps capsule Generic drug: linaclotide Take 145 mcg by mouth daily.   Nortrel 1/35 (28) tablet Generic drug: norethindrone-ethinyl estradiol 1/35 Take 1 tablet by mouth daily.   oxycodone 5 MG capsule Commonly known as: OXY-IR Take 5 mg by mouth every 6 (six) hours.   vitamin B-12 1000 MCG tablet Commonly known as: CYANOCOBALAMIN  Take 1,000 mcg by mouth daily.   Vitamin D-3 25 MCG (1000 UT) Caps Take 1,000 Units by mouth daily.       Allergies:  Allergies  Allergen Reactions  . Sulfa Antibiotics Nausea And Vomiting  . Tetanus Toxoids Swelling    Extreme swelling at injection site    Family History: Family History  Problem Relation Age of Onset  . Seizures Mother   . Emphysema Father   . Heart disease Brother   . Kidney disease Neg Hx   . Prostate cancer Neg Hx   . Bladder Cancer Neg Hx     Social History:  reports that she has never smoked. She has never used smokeless tobacco. She reports current  alcohol use. She reports that she does not use drugs.  ROS: Pertinent ROS in HPI  Physical Exam: BP (!) 146/83   Pulse 85   Ht 5\' 5"  (1.651 m)   Wt 196 lb (88.9 kg)   BMI 32.62 kg/m   Constitutional:  Well nourished. Alert and oriented, No acute distress. HEENT: [place] AT, mask in place.  Trachea midline Cardiovascular: No clubbing, cyanosis, or edema. Respiratory: Normal respiratory effort, no increased work of breathing. GI: Abdomen is soft, non tender, non distended, no abdominal masses.  Suprapubic insertion site is clean and dry.  Scant serosanguineous drainage is noted on gauze. Neurologic: Grossly intact, no focal deficits, moving all 4 extremities.  Needs to ambulate with a walker.  Psychiatric: Normal mood and affect.  Laboratory Data: Lab Results  Component Value Date   WBC 12.4 (H) 09/08/2020   HGB 14.3 09/08/2020   HCT 42.4 09/08/2020   MCV 86.9 09/08/2020   PLT 355 09/08/2020    Lab Results  Component Value Date   CREATININE 0.86 05/21/2020    Urinalysis    Component Value Date/Time   COLORURINE CLEAR (A) 05/21/2020 1529   APPEARANCEUR TURBID (A) 05/21/2020 1529   APPEARANCEUR Cloudy (A) 11/10/2017 0851   LABSPEC 1.001 (L) 05/21/2020 1529   PHURINE 6.0 05/21/2020 1529   GLUCOSEU NEGATIVE 05/21/2020 1529   HGBUR MODERATE (A) 05/21/2020 1529   BILIRUBINUR NEGATIVE 05/21/2020 1529   BILIRUBINUR Negative 11/10/2017 0851   KETONESUR NEGATIVE 05/21/2020 1529   PROTEINUR NEGATIVE 05/21/2020 1529   NITRITE NEGATIVE 05/21/2020 1529   LEUKOCYTESUR MODERATE (A) 05/21/2020 1529   I have reviewed the labs.   Pertinent Imaging: N/A  Assessment & Plan:    1. Suprapubic tube catheter issues Reassured her that what she was experiencing is as to be expected after the placement of an SP tube.  She will follow-up as scheduled on October 18 to be changed to a to a Foley catheter.  She will continue to monitor the insertion site for any sign of purulent drainage.  2.  Neurogenic bladder  RUS 08/2021  Cysto 08/2021  Return for Keep follow-up on October 18 for SPT exchange.  These notes generated with voice recognition software. I apologize for typographical errors.  October 20, PA-C  [facility] [address]  [address] [place], [place] [phone]

## 2020-09-21 ENCOUNTER — Ambulatory Visit: Payer: Medicare HMO | Admitting: Urology

## 2020-09-21 ENCOUNTER — Other Ambulatory Visit: Payer: Self-pay

## 2020-09-21 ENCOUNTER — Encounter: Payer: Self-pay | Admitting: Urology

## 2020-09-21 VITALS — BP 146/83 | HR 85 | Ht 65.0 in | Wt 196.0 lb

## 2020-09-21 DIAGNOSIS — Z9359 Other cystostomy status: Secondary | ICD-10-CM | POA: Diagnosis not present

## 2020-09-21 DIAGNOSIS — N319 Neuromuscular dysfunction of bladder, unspecified: Secondary | ICD-10-CM | POA: Diagnosis not present

## 2020-10-06 ENCOUNTER — Telehealth: Payer: Self-pay

## 2020-10-06 ENCOUNTER — Ambulatory Visit: Payer: Medicare HMO | Admitting: Urology

## 2020-10-06 ENCOUNTER — Encounter: Payer: Self-pay | Admitting: Urology

## 2020-10-06 ENCOUNTER — Other Ambulatory Visit: Payer: Self-pay

## 2020-10-06 VITALS — Ht 69.0 in | Wt 196.0 lb

## 2020-10-06 DIAGNOSIS — Z9359 Other cystostomy status: Secondary | ICD-10-CM

## 2020-10-06 NOTE — Telephone Encounter (Signed)
Incoming call from pt who states that she awoke this morning to a dry overnight bag with no urine drainage. Pt attempted irrigation then switched to a leg bag still with no urine flow. Pt states that her diaper is filling up with urine. Advised pt to come into clinic for assessment pt gave verbal understanding.

## 2020-10-06 NOTE — Progress Notes (Signed)
10/07/2020 9:38 AM   Adrienne Strickland July 06, 1961 [id number]  Referring provider: Sharilyn Sites, MD No address on file Chief Complaint  Patient presents with  . Other    HPI: Adrienne Strickland is a 59 y.o. female with MS and a neurogenic bladder who presents today because her tube is not draining. Patient is accompanied by her husband.  Patient underwent suprapubic catheter placed by IR on 09/08/2020.  Last visit she had tenderness at the insertion site, with some scant drainage and her urine was pink. Patient denied any modifying or aggravating factors.  Patient denied any gross hematuria, dysuria or suprapubic/flank pain.  Patient denied any fevers, chills, nausea or vomiting.   Today she reports having issues with her suprapubic catheter tube not draining. This occurred overnight. She had severe incontinence and needed multiple diaper changes.  Patient denies any modifying or aggravating factors.  Patient denies any gross hematuria, dysuria or suprapubic/flank pain.  Patient denies any fevers, chills, nausea or vomiting.   Suprapubic tube is not draining. I disconnected the leg bag from the pigtail suprapubic tube and immediately had drainage from the pigtail. I then examined the connector on the leg bag and noted it was encrusted with sediment.  PMH: Past Medical History:  Diagnosis Date  . Abnormality of gait 09/26/2013  . Arthritis   . Duodenal ulcer 02/27/2015  . Memory difficulty   . Multiple sclerosis (HCC) 09/26/2013  . Multiple sclerosis (HCC)   . Neurogenic bladder    self caths  . Self-catheterizes urinary bladder   . Urinary frequency     Surgical History: Past Surgical History:  Procedure Laterality Date  . BOTOX INJECTION N/A 12/13/2017   Procedure: BOTOX INJECTION;  Surgeon: Vanna Scotland, MD;  Location: [facility] ORS;  Service: Urology;  Laterality: N/A;  . CHOLECYSTECTOMY    . COLONOSCOPY WITH PROPOFOL N/A 05/22/2018   Procedure: COLONOSCOPY WITH PROPOFOL;   Surgeon: Christena Deem, MD;  Location: [facility] ENDOSCOPY;  Service: Endoscopy;  Laterality: N/A;  . CYSTOSCOPY N/A 12/13/2017   Procedure: CYSTOSCOPY;  Surgeon: Vanna Scotland, MD;  Location: [facility] ORS;  Service: Urology;  Laterality: N/A;  . GALLBLADDER SURGERY      Home Medications:  Allergies as of 10/06/2020      Reactions   Sulfa Antibiotics Nausea And Vomiting   Tetanus Toxoids Swelling   Extreme swelling at injection site      Medication List       Accurate as of October 06, 2020 11:59 PM. If you have any questions, ask your nurse or doctor.        Ampyra 10 MG Tb12 Generic drug: dalfampridine Take 10 mg by mouth 2 (two) times daily.   baclofen 10 MG tablet Commonly known as: LIORESAL Take 10 mg by mouth 2 (two) times daily.   donepezil 10 MG tablet Commonly known as: ARICEPT Take 10 mg by mouth at bedtime.   fluticasone 50 MCG/ACT nasal spray Commonly known as: FLONASE Place 2 sprays into both nostrils daily.   gabapentin 100 MG capsule Commonly known as: NEURONTIN   gluconic acid-citric acid irrigation Insert 30 mL into suprapubic tube and clamp tube for 10 minutes and then drain twice daily   Linzess 145 MCG Caps capsule Generic drug: linaclotide Take 145 mcg by mouth daily.   Nortrel 1/35 (28) tablet Generic drug: norethindrone-ethinyl estradiol 1/35 Take 1 tablet by mouth daily.   oxycodone 5 MG capsule Commonly known as: OXY-IR Take 5 mg by mouth every 6 (  six) hours.   vitamin B-12 1000 MCG tablet Commonly known as: CYANOCOBALAMIN Take 1,000 mcg by mouth daily.   Vitamin D-3 25 MCG (1000 UT) Caps Take 1,000 Units by mouth daily.       Allergies:  Allergies  Allergen Reactions  . Sulfa Antibiotics Nausea And Vomiting  . Tetanus Toxoids Swelling    Extreme swelling at injection site    Family History: Family History  Problem Relation Age of Onset  . Seizures Mother   . Emphysema Father   . Heart disease Brother   . Kidney  disease Neg Hx   . Prostate cancer Neg Hx   . Bladder Cancer Neg Hx     Social History:  reports that she has never smoked. She has never used smokeless tobacco. She reports current alcohol use. She reports that she does not use drugs.   Physical Exam: Ht 5\' 9"  (1.753 m)   Wt 196 lb (88.9 kg)   BMI 28.94 kg/m   Constitutional:  Well nourished. Alert and oriented, No acute distress. HEENT: [place] AT, mask in place.  Trachea midline Cardiovascular: No clubbing, cyanosis, or edema. Respiratory: Normal respiratory effort, no increased work of breathing. GU: No CVA tenderness.  No bladder fullness or masses.  Pigtail catheter in place.  Insertion site clean and dry. Neurologic: Grossly intact, no focal deficits, moving all 4 extremities. Psychiatric: Normal mood and affect.   Laboratory Data: Lab Results  Component Value Date   CREATININE 0.86 05/21/2020    Assessment & Plan:    1. Suprapubic tube catheter issues - Suprapubic catheter tube was full of sediment between the connector tube of the pigtail catheter and leg bag. I took a syringe with 10 cc saline and cleared the sediment. It then began draining without issue. - RTC on 10/12/2020 for SPT exchange.   2. Neurogenic bladder - RUS 09/20222 - Cystoscopy 08/2021  Return for follow up as scheduled on the 18th .  [facility] [address] [place], [place] [phone]  I, [phone], am acting as a scribe for Adrienne Strickland,  I spent 20 minutes on the day of the encounter to include pre-visit record review, face-to-face time with the patient, and post-visit ordering of tests.

## 2020-10-07 ENCOUNTER — Ambulatory Visit: Payer: Medicare HMO | Admitting: Urology

## 2020-10-12 ENCOUNTER — Ambulatory Visit: Payer: Medicare HMO | Admitting: Urology

## 2020-10-12 ENCOUNTER — Other Ambulatory Visit: Payer: Self-pay

## 2020-10-12 DIAGNOSIS — Z9359 Other cystostomy status: Secondary | ICD-10-CM | POA: Diagnosis not present

## 2020-10-12 NOTE — Progress Notes (Signed)
Suprapubic Cath Change  Patient is present today for a suprapubic catheter change due to urinary retention.  The securing suture 16 FR pigtail foley cath was removed from the tract with out difficulty.  Site was cleaned and prepped in a sterile fashion with betadine.  A 16 FR foley cath was replaced into the tract no complications were noted. Urine return was noted, 10 ml of sterile water was inflated into the balloon and a leg bag was attached for drainage.  Patient tolerated well. A night bag was given to patient and proper instruction was given on how to switch bags.    Preformed by: Michiel Cowboy, PA-C, and Eligha Bridegroom, CMA  Follow up: 1 month for SPT exchange  I, [name], am acting as a Neurosurgeon for [employer],  I have reviewed the above documentation for accuracy and completeness, and I agree with the above.    Michiel Cowboy, PA-C

## 2020-11-06 ENCOUNTER — Other Ambulatory Visit: Payer: Self-pay

## 2020-11-06 ENCOUNTER — Other Ambulatory Visit: Payer: Self-pay | Admitting: Radiology

## 2020-11-06 ENCOUNTER — Ambulatory Visit (INDEPENDENT_AMBULATORY_CARE_PROVIDER_SITE_OTHER): Payer: Medicare HMO | Admitting: Family Medicine

## 2020-11-06 DIAGNOSIS — Z9359 Other cystostomy status: Secondary | ICD-10-CM | POA: Diagnosis not present

## 2020-11-06 DIAGNOSIS — N319 Neuromuscular dysfunction of bladder, unspecified: Secondary | ICD-10-CM

## 2020-11-06 DIAGNOSIS — R339 Retention of urine, unspecified: Secondary | ICD-10-CM

## 2020-11-06 NOTE — Progress Notes (Signed)
Patient presents today with SPT problems. Patient's caregiver states yesterday the catheter was not draining properly so he deflated the balloon to the catheter and pulled it out to irrigate the catheter with vinegar and water solution. He then tried to place the SPT tube back in the belly. He thought he was in place and re inflated the balloon. The catheter was not draining.  Today he called the office to have her seen because she was urinating vaginally and no urine was going into the bag. I deflated the balloon and got out 3cc of water and tried adjusting and reinserting the catheter with no success. Amy was notfified and she is getting her scheduled with IR for reinsertion of the SPT tube.   Cath placement  Patient was cleaned and prepped in a sterile fashion with betadine. A 16 FR foley cath was replaced into the bladder no complications were noted Urine return was noted and urine was yellow in color. The balloon was filled with 55ml of sterile water. A night bag was attached for drainage. Patient was given proper instruction on catheter care.    Performed by: Teressa Lower, CMA

## 2020-11-10 ENCOUNTER — Other Ambulatory Visit (HOSPITAL_COMMUNITY): Payer: Self-pay | Admitting: Physician Assistant

## 2020-11-12 ENCOUNTER — Other Ambulatory Visit: Payer: Self-pay | Admitting: Radiology

## 2020-11-13 ENCOUNTER — Other Ambulatory Visit: Payer: Self-pay

## 2020-11-13 ENCOUNTER — Ambulatory Visit: Admission: RE | Admit: 2020-11-13 | Payer: Medicare HMO | Source: Ambulatory Visit

## 2020-11-13 ENCOUNTER — Ambulatory Visit
Admission: RE | Admit: 2020-11-13 | Discharge: 2020-11-13 | Disposition: A | Discharge status: Home / Self Care | Payer: Medicare HMO | Source: Ambulatory Visit | Attending: Urology | Admitting: Urology

## 2020-11-13 DIAGNOSIS — N319 Neuromuscular dysfunction of bladder, unspecified: Secondary | ICD-10-CM | POA: Diagnosis present

## 2020-11-13 DIAGNOSIS — R339 Retention of urine, unspecified: Secondary | ICD-10-CM

## 2020-11-13 DIAGNOSIS — G35 Multiple sclerosis: Secondary | ICD-10-CM | POA: Diagnosis not present

## 2020-11-13 DIAGNOSIS — Z79899 Other long term (current) drug therapy: Secondary | ICD-10-CM | POA: Diagnosis not present

## 2020-11-13 LAB — CBC
HCT: 41.9 % (ref 36.0–46.0)
Hemoglobin: 13.9 g/dL (ref 12.0–15.0)
MCH: 29.6 pg (ref 26.0–34.0)
MCHC: 33.2 g/dL (ref 30.0–36.0)
MCV: 89.1 fL (ref 80.0–100.0)
Platelets: 325 10*3/uL (ref 150–400)
RBC: 4.7 MIL/uL (ref 3.87–5.11)
RDW: 13.2 % (ref 11.5–15.5)
WBC: 12.5 10*3/uL — ABNORMAL HIGH (ref 4.0–10.5)
nRBC: 0 % (ref 0.0–0.2)

## 2020-11-13 LAB — PROTIME-INR
INR: 0.9 (ref 0.8–1.2)
Prothrombin Time: 12.2 seconds (ref 11.4–15.2)

## 2020-11-13 LAB — PREGNANCY, URINE: Preg Test, Ur: NEGATIVE

## 2020-11-13 MED ORDER — MIDAZOLAM HCL 2 MG/2ML IJ SOLN
INTRAMUSCULAR | Status: AC
  Filled 2020-11-13: qty 2

## 2020-11-13 MED ORDER — CIPROFLOXACIN IN D5W 200 MG/100ML IV SOLN
INTRAVENOUS | Status: AC | PRN
  Administered 2020-11-13: 400 mg via INTRAVENOUS

## 2020-11-13 MED ORDER — SODIUM CHLORIDE 0.9 % IV SOLN: INTRAVENOUS | Status: DC

## 2020-11-13 MED ORDER — CIPROFLOXACIN IN D5W 400 MG/200ML IV SOLN
INTRAVENOUS | Status: AC
  Filled 2020-11-13: qty 200

## 2020-11-13 MED ORDER — CLINDAMYCIN PHOSPHATE 300 MG/50ML IV SOLN
INTRAVENOUS | Status: AC
  Filled 2020-11-13: qty 50

## 2020-11-13 MED ORDER — FENTANYL CITRATE (PF) 100 MCG/2ML IJ SOLN
INTRAMUSCULAR | Status: AC
  Filled 2020-11-13: qty 2

## 2020-11-13 MED ORDER — SODIUM CHLORIDE 0.9% FLUSH: 5.0000 mL | Freq: Three times a day (TID) | INTRAVENOUS | Status: DC

## 2020-11-13 MED ORDER — MIDAZOLAM HCL 2 MG/2ML IJ SOLN
INTRAMUSCULAR | Status: AC | PRN
  Administered 2020-11-13 (×2): 1 mg via INTRAVENOUS

## 2020-11-13 MED ORDER — CIPROFLOXACIN IN D5W 400 MG/200ML IV SOLN
400.0000 mg | Freq: Once | INTRAVENOUS | Status: DC
  Filled 2020-11-13: qty 200

## 2020-11-13 MED ORDER — FENTANYL CITRATE (PF) 100 MCG/2ML IJ SOLN
INTRAMUSCULAR | Status: AC | PRN
  Administered 2020-11-13 (×2): 50 ug via INTRAVENOUS

## 2020-11-13 NOTE — Procedures (Signed)
Interventional Radiology Procedure Note  Procedure: CT guided suprapubic catheter placement  Findings: Please refer to procedural dictation for full description.  Successful placement of 16 Fr pigtail suprapubic catheter.  Complications: None immediate  Estimated Blood Loss: <5 mL  Recommendations: Keep to bag drainage. OK to remove urethral Foley catheter. Follow up as needed for tube maintenance, as per Urology.   Marliss Coots, MD Pager: [phone]

## 2020-11-13 NOTE — Discharge Instructions (Signed)
Moderate Conscious Sedation, Adult, Care After These instructions provide you with information about caring for yourself after your procedure. Your health care provider may also give you more specific instructions. Your treatment has been planned according to current medical practices, but problems sometimes occur. Call your health care provider if you have any problems or questions after your procedure. What can I expect after the procedure? After your procedure, it is common:  To feel sleepy for several hours.  To feel clumsy and have poor balance for several hours.  To have poor judgment for several hours.  To vomit if you eat too soon. Follow these instructions at home: For at least 24 hours after the procedure:   Do not: ? Participate in activities where you could fall or become injured. ? Drive. ? Use heavy machinery. ? Drink alcohol. ? Take sleeping pills or medicines that cause drowsiness. ? Make important decisions or sign legal documents. ? Take care of children on your own.  Rest. Eating and drinking  Follow the diet recommended by your health care provider.  If you vomit: ? Drink water, juice, or soup when you can drink without vomiting. ? Make sure you have little or no nausea before eating solid foods. General instructions  Have a responsible adult stay with you until you are awake and alert.  Take over-the-counter and prescription medicines only as told by your health care provider.  If you smoke, do not smoke without supervision.  Keep all follow-up visits as told by your health care provider. This is important. Contact a health care provider if:  You keep feeling nauseous or you keep vomiting.  You feel light-headed.  You develop a rash.  You have a fever. Get help right away if:  You have trouble breathing. This information is not intended to replace advice given to you by your health care provider. Make sure you discuss any questions you have  with your health care provider. Document Revised: 11/24/2017 Document Reviewed: 04/02/2016 Elsevier Patient Education  2020 [employer] Suprapubic Catheter Home Guide A suprapubic catheter is a flexible tube that is used to drain urine from the bladder into a collection bag outside the body. The catheter is inserted into the bladder through a small opening in the lower abdomen, above the pubic bone (suprapubic area) and a few inches below your belly button (navel). A tiny balloon filled with germ-free (sterile) water helps to keep the catheter in place. The collection bag must be emptied at least once a day and cleaned at least every other day. The collection bag can be put beside your bed at night and attached to your leg during the day. You may have a large collection bag to use at night and a smaller one to use during the day. Your suprapubic catheter may need to be changed every 4-6 weeks, or as often as recommended by your health care provider. Healing of the tract where the catheter is placed can take 6 weeks to 6 months. During that time, your health care provider may change your catheter. Once the tract is well healed, you or a caregiver will change your suprapubic catheter at home. What are the risks? This catheter is safe to use. However, problems can occur, including:  Blocked urine flow. This can occur if the catheter stops working, or if you have a blood clot in your bladder or in the catheter.  Irritation of the skin around the catheter.  Infection. This can happen if bacteria gets into  your bladder. Supplies needed:  Two pairs of sterile gloves.  Paper towels.  Catheter.  Two syringes.  Sterile water.  Sterile cleaning solution.  Lubricant.  Collection bags. How to change the catheter  1. Drink plenty of fluids during the hours before you change the catheter. 2. Wash your hands with soap and water. If soap and water are not available, use hand sanitizer. 3. Draw  up sterile water into a syringe to have ready to fill the new catheter balloon. The amount will depend on the size of the balloon. 4. Have all of your supplies ready and close to you on a paper towel. 5. Lie on your back, sitting slightly upright so that you can see the catheter and opening. 6. Put on sterile gloves. 7. Clean the skin around the catheter opening using the sterile cleaning solution. 8. Remove the water from the balloon in the catheter using a syringe. 9. Slowly remove the catheter. If the catheter seems stuck, or if you have difficulty removing it: ? Do not pull on it. ? Call your health care provider right away. 10. Place the old catheter on a paper towel to discard later. 11. Take off the used gloves, and put on a new pair. 12. Put lubricant on the end of the new catheter that will go into your bladder. 13. Clean the skin around the catheter opening using the sterile cleaning solution. 14. Gently slide the catheter through the opening in your abdomen and into the tract that leads to your bladder. 15. Wait for some urine to start flowing through the catheter. 16. When urine starts to flow through the catheter, attach the collection bag to the end of the catheter. Make sure the connection is tight. 17. Use a syringe to fill the catheter balloon with sterile water. Fill to the amount directed by your health care provider. 18. Remove the gloves and wash your hands with soap and water. How to care for the skin around the catheter Follow your health care provider's instructions on caring for your skin.  Use a clean washcloth and soapy water to clean the skin around your catheter every day. Pat the area dry with a clean paper towel.  Do not pull on the catheter.  Do not use ointment or lotion on this area, unless told by your health care provider.  Check the skin around the catheter every day for signs of infection. Check for: ? Redness, swelling, or pain. ? Fluid or  blood. ? Warmth. ? Pus or a bad smell. How to empty and clean the collection bag Empty the large collection bag every 8 hours. Empty the small collection bag when it is about ? full. Clean the collection bag every 2-3 days, or as often as told by your health care provider. To do this: 1. Wash your hands with soap and water. If soap and water are not available, use hand sanitizer. 2. Disconnect the bag from the catheter and immediately attach a new bag to the catheter. 3. Hold the used bag over the toilet or another container. 4. Turn the valve (spigot) at the bottom of the bag to empty the urine. Empty the used bag completely. ? Do not touch the opening of the spigot. ? Do not let the opening touch the toilet or container. 5. Close the spigot tightly when the bag is empty. 6. Clean the used bag in one of the following methods: ? According to the manufacturer's instructions. ? As told by your health  care provider. 7. Let the bag dry completely. Put it in a clean plastic bag before storing it. General tips   Always wash your hands before and after caring for your catheter and collection bag. Use a mild, fragrance-free soap. If soap and water are not available, use hand sanitizer.  Clean the outside of the catheter with soap and water as often as told by your health care provider.  Always make sure there are no twists or kinks in the catheter tube.  Always make sure there are no leaks in the catheter or collection bag.  Always wear the leg bag below your knee.  Make sure the overnight drainage bag is always lower than the level of your bladder, but do not let it touch the floor. Before you go to sleep, hang the bag inside a wastebasket that is covered by a clean plastic bag.  Drink enough fluid to keep your urine pale yellow.  Do not take baths, swim, or use a hot tub until your health care provider approves. Ask your health care provider if you may take showers. Contact a heath care  provider if:  You leak urine.  You have redness, swelling, or pain around your catheter.  You have fluid or blood coming from your catheter opening.  Your catheter opening feels warm to the touch.  You have pus or a bad smell coming from your catheter opening.  You have a fever or chills.  Your urine flow slows down.  Your urine becomes cloudy or smelly. Get help right away if:  Your catheter comes out.  You have: ? Nausea. ? Back pain. ? Difficulty changing your catheter. ? Blood in your urine. ? No urine flow for 1 hour. Summary  A suprapubic catheter is a flexible tube that is used to drain urine from the bladder into a collection bag outside the body.  Your suprapubic catheter may need to be changed every 4-6 weeks, or as recommended by your health care provider.  Follow instructions on how to change the catheter and how to empty and clean the collection bag.  Always wash your hands before and after caring for your catheter and collection bag. Drink enough fluid to keep your urine pale yellow.  Get help right away if you have difficulty changing your catheter or if there is blood in your urine. This information is not intended to replace advice given to you by your health care provider. Make sure you discuss any questions you have with your health care provider. Document Revised: 04/04/2019 Document Reviewed: 01/16/2019 Elsevier Patient Education  2020 Else[employer] Inc[employer]  

## 2020-11-13 NOTE — H&P (Signed)
Chief Complaint: MS with neurogenic bladder with urinary retention. Request is for suprapubic catheter replacement  Referring Physician(s): McGowan,Shannon A  Supervising Physician:Dr. D Suttle  Patient Status: [facility] - Out-pt  History of Present Illness: Adrienne Strickland is a 59 y.o. female 59 y.o. female outpatient. 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 IR placed a 16 Fr suprapubic catheter on 9.14.21 last exchanged in the urology office on 10.18.21. Per note from [facility] from Elenora Gamma CMA dated 11.12.21. Patient's caregiver states yesterday the catheter was not draining properly so he deflated the balloon to the catheter and pulled it out to irrigate the catheter with vinegar and water solution. He then tried to place the SPT tube back in the belly. He thought he was in place and re inflated the balloon. The catheter was not draining.  Today he called the office to have her seen because she was urinating vaginally and no urine was going into the bag. I deflated the balloon and got out 3cc of water and tried adjusting and reinserting the catheter with no success. Team is requesting a suprapubic catheter replacement.      Past Medical History:  Diagnosis Date  . Abnormality of gait 09/26/2013  . Arthritis   . Duodenal ulcer 02/27/2015  . Memory difficulty   . Multiple sclerosis (HCC) 09/26/2013  . Multiple sclerosis (HCC)   . Neurogenic bladder    self caths  . Self-catheterizes urinary bladder   . Urinary frequency     Past Surgical History:  Procedure Laterality Date  . BOTOX INJECTION N/A 12/13/2017   Procedure: BOTOX INJECTION;  Surgeon: Vanna Scotland, MD;  Location: [facility] ORS;  Service: Urology;  Laterality: N/A;  . CHOLECYSTECTOMY    . COLONOSCOPY WITH PROPOFOL N/A 05/22/2018   Procedure: COLONOSCOPY WITH PROPOFOL;  Surgeon: Christena Deem, MD;  Location: [facility] ENDOSCOPY;  Service: Endoscopy;  Laterality: N/A;  . CYSTOSCOPY N/A  12/13/2017   Procedure: CYSTOSCOPY;  Surgeon: Vanna Scotland, MD;  Location: [facility] ORS;  Service: Urology;  Laterality: N/A;  . GALLBLADDER SURGERY      Allergies: Sulfa antibiotics and Tetanus toxoids  Medications: Prior to Admission medications   Medication Sig Start Date End Date Taking? Authorizing Provider  baclofen (LIORESAL) 10 MG tablet Take 10 mg by mouth 2 (two) times daily.    Yes [provider]  Cholecalciferol (VITAMIN D-3) 1000 UNITS CAPS Take 1,000 Units by mouth daily.    Yes [provider]  dalfampridine (AMPYRA) 10 MG TB12 Take 10 mg by mouth 2 (two) times daily.   Yes [provider]  donepezil (ARICEPT) 10 MG tablet Take 10 mg by mouth at bedtime.    Yes [provider]  fluticasone (FLONASE) 50 MCG/ACT nasal spray Place 2 sprays into both nostrils daily. 08/08/15  Yes Domenick Gong, MD  norethindrone-ethinyl estradiol 1/35 (NORTREL 1/35, 28,) tablet Take 1 tablet by mouth daily.  01/21/15  Yes [provider]  oxycodone (OXY-IR) 5 MG capsule Take 5 mg by mouth every 6 (six) hours.    Yes [provider]  vitamin B-12 (CYANOCOBALAMIN) 1000 MCG tablet Take 1,000 mcg by mouth daily.   Yes [provider]  gabapentin (NEURONTIN) 100 MG capsule  08/20/20   [provider]  gluconic acid-citric acid (RENACIDIN) irrigation Insert 30 mL into suprapubic tube and clamp tube for 10 minutes and then drain twice daily Patient not taking: Reported on 11/13/2020 02/10/20   Harle Battiest, PA-C  LINZESS 145 MCG CAPS capsule Take 145 mcg by mouth daily. Patient not taking: Reported on 11/13/2020 11/08/19   [provider]     Family History  Problem Relation Age of Onset  . Seizures Mother   . Emphysema Father   . Heart disease Brother   . Kidney disease Neg Hx   . Prostate cancer Neg Hx   . Bladder Cancer Neg Hx     Social History   Socioeconomic History  . Marital status: Single     Spouse name: Not on file  . Number of children: 0  . Years of education: Not on file  . Highest education level: Not on file  Occupational History  . Occupation: unemployed  Tobacco Use  . Smoking status: Never Smoker  . Smokeless tobacco: Never Used  Vaping Use  . Vaping Use: Never used  Substance and Sexual Activity  . Alcohol use: Yes    Alcohol/week: 0.0 standard drinks    Comment: drinks everyday 2 beers  . Drug use: No  . Sexual activity: Not on file  Other Topics Concern  . Not on file  Social History Narrative   Patient consumes 2 cups coffee daily. Lives with significant other   Social Determinants of Health   Financial Resource Strain:   . Difficulty of Paying Living Expenses: Not on file  Food Insecurity:   . Worried About Programme researcher, broadcasting/film/video in the Last Year: Not on file  . Ran Out of Food in the Last Year: Not on file  Transportation Needs:   . Lack of Transportation (Medical): Not on file  . Lack of Transportation (Non-Medical): Not on file  Physical Activity:   . Days of Exercise per Week: Not on file  . Minutes of Exercise per Session: Not on file  Stress:   . Feeling of Stress : Not on file  Social Connections:   . Frequency of Communication with Friends and Family: Not on file  . Frequency of Social Gatherings with Friends and Family: Not on file  . Attends Religious Services: Not on file  . Active Member of Clubs or Organizations: Not on file  . Attends Banker Meetings: Not on file  . Marital Status: Not on file      Review of Systems: A 12 point ROS discussed and pertinent positives are indicated in the HPI above.  All other systems are negative.  Review of Systems  Constitutional: Negative for fatigue and fever.  HENT: Negative for congestion.   Respiratory: Negative for cough and shortness of breath.   Gastrointestinal: Negative for abdominal pain, diarrhea, nausea and vomiting.    Vital Signs: There were no vitals taken  for this visit.  Physical Exam Vitals and nursing note reviewed.  Constitutional:      Appearance: She is well-developed.  HENT:     Head: Normocephalic and atraumatic.  Eyes:     Conjunctiva/sclera: Conjunctivae normal.  Cardiovascular:     Rate and Rhythm: Normal rate and regular rhythm.     Heart sounds: Normal heart sounds.  Pulmonary:     Effort: Pulmonary effort is normal.     Breath sounds: Normal breath sounds.  Abdominal:     Comments: Former suprapubic catheter site with no leaking noted.   Musculoskeletal:     Cervical back: Normal range of motion.  Neurological:     Mental Status: She is alert and oriented to person, place, and time.     Imaging: No results  found.  Labs:  CBC: Recent Labs    05/21/20 1512 09/08/20 1202  WBC 13.7* 12.4*  HGB 13.9 14.3  HCT 40.9 42.4  PLT 267 355    COAGS: Recent Labs    09/08/20 1202  INR 1.0    BMP: Recent Labs    05/21/20 1512  NA 135  K 3.7  CL 105  CO2 21*  GLUCOSE 124*  BUN 15  CALCIUM 8.8*  CREATININE 0.86  GFRNONAA >60  GFRAA >60    Assessment and Plan:  59 y.o. female outpatient. 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 IR placed a 16 Fr suprapubic catheter on 9.14.21 last exchanged in the urology office on 10.18.21. Per note from [facility] from Elenora Gamma CMA dated 11.12.21. Patient's caregiver states yesterday the catheter was not draining properly so he deflated the balloon to the catheter and pulled it out to irrigate the catheter with vinegar and water solution. He then tried to place the SPT tube back in the belly. He thought he was in place and re inflated the balloon. The catheter was not draining.  Today he called the office to have her seen because she was urinating vaginally and no urine was going into the bag. I deflated the balloon and got out 3cc of water and tried adjusting and reinserting the catheter with no success. Team is requesting a suprapubic  catheter replacement.    No pertinent imaging. All labs and medications are within acceptable parameters. Allergies include sulfa. Patient has been NPO since midnight  Risks and benefits discussed with the patient including bleeding, infection, damage to adjacent structures, bowel perforation/fistula connection, and sepsis.  All of the patient's questions were answered, patient is agreeable to proceed. Consent signed and in chart.   Thank you for this interesting consult.  I greatly enjoyed meeting Adrienne Strickland and look forward to participating in their care.  A copy of this report was sent to the requesting provider on this date.  Electronically Signed: Alene Mires, NP 11/13/2020, 9:57 AM   I spent a total of  30 Minutes   in face to face in clinical consultation, greater than 50% of which was counseling/coordinating care for suprapubic catheter placement

## 2020-11-16 ENCOUNTER — Ambulatory Visit: Payer: Medicare HMO | Admitting: Urology

## 2020-11-23 ENCOUNTER — Telehealth: Payer: Self-pay | Admitting: Urology

## 2020-11-23 ENCOUNTER — Ambulatory Visit (INDEPENDENT_AMBULATORY_CARE_PROVIDER_SITE_OTHER): Payer: Medicare HMO | Admitting: Urology

## 2020-11-23 ENCOUNTER — Encounter: Payer: Self-pay | Admitting: Urology

## 2020-11-23 VITALS — BP 138/78 | HR 87 | Ht 65.0 in | Wt 198.0 lb

## 2020-11-23 DIAGNOSIS — Z9359 Other cystostomy status: Secondary | ICD-10-CM | POA: Diagnosis not present

## 2020-11-23 NOTE — Telephone Encounter (Signed)
Pt scheduled today at 130pm 

## 2020-11-23 NOTE — Telephone Encounter (Signed)
We need to see her to evaluate her for her pain.

## 2020-11-23 NOTE — Telephone Encounter (Signed)
Pt. States she is having a lot of burning around stitches and was told that would be normal for about a week. Pt. States it has been over 2 weeks now and she is in pain. Please advise.

## 2020-11-23 NOTE — Telephone Encounter (Signed)
.  left message to have patient return my call.  

## 2020-11-23 NOTE — Progress Notes (Signed)
Ms. Osgood presented today stating that her stitches were still stinging and burning in the SPT site and wanted someone to evaluate her SPT site.  On examination, the suprapubic tube is on extreme tension.  The StatLock is not small enough to secure the suprapubic tubing as it readily slides in between the lock.  The Del strap is also located strap below her knee.  There is already erosion occurring at the SPT site due to the tension.  I put slack in the tubing and taped it to her right thigh.  I then moved up the double strap and advised her to continue to keep slack in the line and not to keep it taunt.  She also had episodes of runny diarrhea during the exam which leaked onto the floor.  She denied any recent use of antibiotics.  She states that this is typical with her MS.  Patient is given a fresh depend and her husband was able to provide her with some shorts.    She will follow-up as scheduled on 20 December for suprapubic catheter exchange.  I spent 15 minutes on the day of the encounter to include pre-visit record review, face-to-face time with the patient, and post-visit ordering of tests.

## 2020-11-30 ENCOUNTER — Ambulatory Visit (INDEPENDENT_AMBULATORY_CARE_PROVIDER_SITE_OTHER): Payer: Medicare HMO | Admitting: Urology

## 2020-11-30 ENCOUNTER — Encounter: Payer: Self-pay | Admitting: Urology

## 2020-11-30 ENCOUNTER — Telehealth: Payer: Self-pay | Admitting: Urology

## 2020-11-30 ENCOUNTER — Other Ambulatory Visit: Payer: Self-pay

## 2020-11-30 VITALS — BP 151/80 | HR 83 | Ht 65.0 in | Wt 193.0 lb

## 2020-11-30 DIAGNOSIS — R339 Retention of urine, unspecified: Secondary | ICD-10-CM | POA: Diagnosis not present

## 2020-11-30 LAB — BLADDER SCAN AMB NON-IMAGING: Scan Result: 30

## 2020-11-30 MED ORDER — CEPHALEXIN 500 MG PO CAPS: 500.0000 mg | ORAL_CAPSULE | Freq: Two times a day (BID) | ORAL | 0 refills | Status: DC

## 2020-11-30 NOTE — Telephone Encounter (Signed)
Pt calls office this morning, after attempting to go to ER last evening, pt stating her urostomy isn't draining, is bleeding and seems to be infected per caregiver (pt can't see urostomy site) pt states her bag has been dry for about 30 hrs, has tried to self cath with no success, only getting maybe a teaspoon/tablespoon amount. Pt states she feels like her pad under her is getting wet.  Added pt to schedule in [place]

## 2020-11-30 NOTE — Progress Notes (Signed)
11/30/2020 9:53 AM   Adrienne Strickland [birth year] [id number]  Referring provider: Sharilyn Sites, MD No address on file  Chief Complaint  Patient presents with  . Acute Visit    bleeding    HPI Adrienne Strickland is a 59 y.o. female with a neurogenic bladder that is managed with a SPT who presents today urgently after she has had no urinary drainage in her bag overnight.    Her tube was placed on 11/13/2020 by IR and was draining well for the last two weeks.  Then last night, she noticed no urine in her leg bag.  She has had incontinence episodes into her depends.  Patient denies any modifying or aggravating factors.  Patient denies any gross hematuria, dysuria or suprapubic/flank pain.  Patient denies any fevers, chills, nausea or vomiting.   She does state the area around the suprapubic tube is tender.  Her PVR is 30 mL.    PMH: Past Medical History:  Diagnosis Date  . Abnormality of gait 09/26/2013  . Arthritis   . Duodenal ulcer 02/27/2015  . Memory difficulty   . Multiple sclerosis (HCC) 09/26/2013  . Multiple sclerosis (HCC)   . Neurogenic bladder    self caths  . Self-catheterizes urinary bladder   . Urinary frequency     Surgical History: Past Surgical History:  Procedure Laterality Date  . BOTOX INJECTION N/A 12/13/2017   Procedure: BOTOX INJECTION;  Surgeon: Adrienne Scotland, MD;  Location: [facility] ORS;  Service: Urology;  Laterality: N/A;  . CHOLECYSTECTOMY    . COLONOSCOPY WITH PROPOFOL N/A 05/22/2018   Procedure: COLONOSCOPY WITH PROPOFOL;  Surgeon: Adrienne Deem, MD;  Location: [facility] ENDOSCOPY;  Service: Endoscopy;  Laterality: N/A;  . CYSTOSCOPY N/A 12/13/2017   Procedure: CYSTOSCOPY;  Surgeon: Adrienne Scotland, MD;  Location: [facility] ORS;  Service: Urology;  Laterality: N/A;  . GALLBLADDER SURGERY      Home Medications:  Allergies as of 11/30/2020      Reactions   Sulfa Antibiotics Nausea And Vomiting   Tetanus Toxoids Swelling   Extreme swelling Strickland  injection site      Medication List       Accurate as of November 30, 2020  9:53 AM. If you have any questions, ask your nurse or doctor.        Ampyra 10 MG Tb12 Generic drug: dalfampridine Take 10 mg by mouth 2 (two) times daily.   baclofen 10 MG tablet Commonly known as: LIORESAL Take 10 mg by mouth 2 (two) times daily.   cephALEXin 500 MG capsule Commonly known as: Keflex Take 1 capsule (500 mg total) by mouth 2 (two) times daily. Started by: Adrienne Cowboy, PA-C   donepezil 10 MG tablet Commonly known as: ARICEPT Take 10 mg by mouth Strickland bedtime.   fluticasone 50 MCG/ACT nasal spray Commonly known as: FLONASE Place 2 sprays into both nostrils daily.   Nortrel 1/35 (28) tablet Generic drug: norethindrone-ethinyl estradiol 1/35 Take 1 tablet by mouth daily.   oxycodone 5 MG capsule Commonly known as: OXY-IR Take 5 mg by mouth every 6 (six) hours.   vitamin B-12 1000 MCG tablet Commonly known as: CYANOCOBALAMIN Take 1,000 mcg by mouth daily.   Vitamin D-3 25 MCG (1000 UT) Caps Take 1,000 Units by mouth daily.       Allergies:  Allergies  Allergen Reactions  . Sulfa Antibiotics Nausea And Vomiting  . Tetanus Toxoids Swelling    Extreme swelling Strickland injection site  Family History: Family History  Problem Relation Age of Onset  . Seizures Mother   . Emphysema Father   . Heart disease Brother   . Kidney disease Neg Hx   . Prostate cancer Neg Hx   . Bladder Cancer Neg Hx     Social History:  reports that she has never smoked. She has never used smokeless tobacco. She reports current alcohol use. She reports that she does not use drugs.  ROS: Pertinent ROS in HPI  Physical Exam: BP (!) 151/80   Pulse 83   Ht 5\' 5"  (1.651 m)   Wt 193 lb (87.5 kg)   BMI 32.12 kg/m   Constitutional:  Well nourished. Alert and oriented, No acute distress. HEENT: Adrienne Strickland, mask in place.  Trachea midline Cardiovascular: No clubbing, cyanosis, or  edema. Respiratory: Normal respiratory effort, no increased work of breathing. GU: No CVA tenderness.  No bladder fullness or masses.  SPT site is red and irritated.  SPT is found laying on her abdomen.   Normal external genitalia, normal pubic hair distribution, no lesions.  Normal urethral meatus, no lesions, no prolapse, no discharge.   No urethral masses, tenderness and/or tenderness.  Coagulated sebum is seen within the labia folds.   Neurologic: Grossly intact, no focal deficits, moving all 4 extremities. Psychiatric: Normal mood and affect.   Laboratory Data: Lab Results  Component Value Date   WBC 12.5 (H) 11/13/2020   HGB 13.9 11/13/2020   HCT 41.9 11/13/2020   MCV 89.1 11/13/2020   PLT 325 11/13/2020    Lab Results  Component Value Date   CREATININE 0.86 05/21/2020    Urinalysis    Component Value Date/Time   COLORURINE CLEAR (A) 05/21/2020 1529   APPEARANCEUR TURBID (A) 05/21/2020 1529   APPEARANCEUR Cloudy (A) 11/10/2017 0851   LABSPEC 1.001 (L) 05/21/2020 1529   PHURINE 6.0 05/21/2020 1529   GLUCOSEU NEGATIVE 05/21/2020 1529   HGBUR MODERATE (A) 05/21/2020 1529   BILIRUBINUR NEGATIVE 05/21/2020 1529   BILIRUBINUR Negative 11/10/2017 0851   KETONESUR NEGATIVE 05/21/2020 1529   PROTEINUR NEGATIVE 05/21/2020 1529   NITRITE NEGATIVE 05/21/2020 1529   LEUKOCYTESUR MODERATE (A) 05/21/2020 1529    I have reviewed the labs.   Pertinent Imaging: Results for Adrienne, Strickland (MRN Adrienne Strickland) as of 11/30/2020 09:44  Ref. Range 11/30/2020 09:43  Scan Result Unknown 30 ml   Procedure Simple Catheter Placement Due to neurogenic bladder and SPT falling out, Adrienne Strickland is present today for a foley cath placement.  Patient was cleaned and prepped in a sterile fashion with betadine. A 16 FR foley catheter was inserted, urine return was noted  70 ml, urine was yellow in color.  The balloon was filled with 10cc of sterile water.  Patient was  given a night bag to take home.   Patient was given instruction on proper catheter care.  Patient tolerated well, no complications were noted   Performed by: Adrienne Downs, PA-C  Assessment & Plan:    1. Neurogenic bladder SPT has fallen out.  Her husband and the patient where asking why we have recommended the SPT placement versus having an indwelling Foley.  I explained that with her stool incontinence, an indwelling Foley would put her an increased risk for UTI's and sometimes, UTI's become septic.  The voiced their understanding and wish to proceed with another SPT placement. We will go ahead and schedule another SPT placement by IR Strickland this time. I have sent in Keflex due  to her traumatic SPT removal   Return for SPT placement by IR .  These notes generated with voice recognition software. I apologize for typographical errors.  Adrienne Cowboy, PA-C  [facility] [address]  [address] [place], [place] [address] [phone]

## 2020-12-03 ENCOUNTER — Other Ambulatory Visit: Payer: Self-pay | Admitting: Radiology

## 2020-12-03 DIAGNOSIS — R339 Retention of urine, unspecified: Secondary | ICD-10-CM

## 2020-12-04 ENCOUNTER — Telehealth: Payer: Self-pay | Admitting: Radiology

## 2020-12-04 NOTE — Telephone Encounter (Signed)
They aren't listed on her medication list.

## 2020-12-04 NOTE — Telephone Encounter (Signed)
Patient reports cramping and pain in lower abdomen. Cannot get comfortable. Taking oxycodone without relief. Patient also states she does not want to proceed with suprapubic tube replacement. Please advise.

## 2020-12-04 NOTE — Telephone Encounter (Signed)
Is she taking oxybutynin or Myrbetriq?

## 2020-12-07 ENCOUNTER — Other Ambulatory Visit: Payer: Self-pay

## 2020-12-07 ENCOUNTER — Ambulatory Visit (INDEPENDENT_AMBULATORY_CARE_PROVIDER_SITE_OTHER): Payer: Medicare HMO | Admitting: Urology

## 2020-12-07 ENCOUNTER — Encounter: Payer: Self-pay | Admitting: Urology

## 2020-12-07 VITALS — BP 128/68 | Temp 98.5°F | Ht 65.0 in | Wt 200.0 lb

## 2020-12-07 DIAGNOSIS — Z978 Presence of other specified devices: Secondary | ICD-10-CM | POA: Diagnosis not present

## 2020-12-07 DIAGNOSIS — R197 Diarrhea, unspecified: Secondary | ICD-10-CM | POA: Diagnosis not present

## 2020-12-07 NOTE — Telephone Encounter (Signed)
Patient reports continued abdominal discomfort. Denies taking oxybutynin or Myrbetriq. Please advise.

## 2020-12-07 NOTE — Telephone Encounter (Signed)
Would she be able to come this afternoon?

## 2020-12-07 NOTE — Telephone Encounter (Signed)
I thought Adrienne Strickland was coming in today for an appointment in [place]?  Will you call her and see how she is doing?  Also, ask her if she is taking the oxybutynin or the Myrbetriq.  I know they are not her med list, but she has taken them in the past.

## 2020-12-07 NOTE — Progress Notes (Signed)
12/07/2020 1:37 PM   Adrienne Strickland 1961-01-17 [id number]  Referring provider: Sharilyn Sites, MD No address on file  Chief Complaint  Patient presents with  . Acute Visit    Abdominal discomfort    HPI: Adrienne Strickland is a 59 y.o. female with a neurogenic bladder due to MS managed with a chronic Foley with her husband, Adrienne Strickland.   She has had two attempts at [facility] placement.  The first tube was removed too early secondarily to being contaminated by stool.  The second tube was pulled out by tension.  She is not wanting another SPT at this time.    The pain at the former SPT site is now a 2/10.  Her and her husband are concerned it may be infected.      PMH: Past Medical History:  Diagnosis Date  . Abnormality of gait 09/26/2013  . Arthritis   . Duodenal ulcer 02/27/2015  . Memory difficulty   . Multiple sclerosis (HCC) 09/26/2013  . Multiple sclerosis (HCC)   . Neurogenic bladder    self caths  . Self-catheterizes urinary bladder   . Urinary frequency     Surgical History: Past Surgical History:  Procedure Laterality Date  . BOTOX INJECTION N/A 12/13/2017   Procedure: BOTOX INJECTION;  Surgeon: Vanna Scotland, MD;  Location: [facility] ORS;  Service: Urology;  Laterality: N/A;  . CHOLECYSTECTOMY    . COLONOSCOPY WITH PROPOFOL N/A 05/22/2018   Procedure: COLONOSCOPY WITH PROPOFOL;  Surgeon: Christena Deem, MD;  Location: [facility] ENDOSCOPY;  Service: Endoscopy;  Laterality: N/A;  . CYSTOSCOPY N/A 12/13/2017   Procedure: CYSTOSCOPY;  Surgeon: Vanna Scotland, MD;  Location: [facility] ORS;  Service: Urology;  Laterality: N/A;  . GALLBLADDER SURGERY      Home Medications:  Allergies as of 12/07/2020      Reactions   Sulfa Antibiotics Nausea And Vomiting   Tetanus Toxoids Swelling   Extreme swelling at injection site      Medication List       Accurate as of December 07, 2020  1:37 PM. If you have any questions, ask your nurse or doctor.        STOP taking these  medications   cephALEXin 500 MG capsule Commonly known as: Keflex Stopped by: Timoth Schara, PA-C     TAKE these medications   Ampyra 10 MG Tb12 Generic drug: dalfampridine Take 10 mg by mouth 2 (two) times daily.   baclofen 10 MG tablet Commonly known as: LIORESAL Take 10 mg by mouth 2 (two) times daily.   donepezil 10 MG tablet Commonly known as: ARICEPT Take 10 mg by mouth at bedtime.   fluticasone 50 MCG/ACT nasal spray Commonly known as: FLONASE Place 2 sprays into both nostrils daily.   Nortrel 1/35 (28) tablet Generic drug: norethindrone-ethinyl estradiol 1/35 Take 1 tablet by mouth daily.   oxycodone 5 MG capsule Commonly known as: OXY-IR Take 5 mg by mouth every 6 (six) hours.   vitamin B-12 1000 MCG tablet Commonly known as: CYANOCOBALAMIN Take 1,000 mcg by mouth daily.   Vitamin D-3 25 MCG (1000 UT) Caps Take 1,000 Units by mouth daily.       Allergies:  Allergies  Allergen Reactions  . Sulfa Antibiotics Nausea And Vomiting  . Tetanus Toxoids Swelling    Extreme swelling at injection site    Family History: Family History  Problem Relation Age of Onset  . Seizures Mother   . Emphysema Father   . Heart disease  Brother   . Kidney disease Neg Hx   . Prostate cancer Neg Hx   . Bladder Cancer Neg Hx     Social History:  reports that she has never smoked. She has never used smokeless tobacco. She reports current alcohol use. She reports that she does not use drugs.  ROS: Pertinent ROS in HPI  Physical Exam: BP 128/68   Temp 98.5 F (36.9 C) (Oral)   Ht 5\' 5"  (1.651 m)   Wt 200 lb (90.7 kg)   BMI 33.28 kg/m   Constitutional:  Well nourished. Alert and oriented, No acute distress. HEENT: [place] AT, moist mucus membranes.  Trachea midline, no masses. Cardiovascular: No clubbing, cyanosis, or edema. Respiratory: Normal respiratory effort, no increased work of breathing. GI: Abdomen is soft, non tender, non distended, no abdominal masses.   Former SPT site is clean and dry.  There is no drainage, fluctuance, induration or erythema.   Site is well healed. GU: No CVA tenderness.  No bladder fullness or masses.   Neurologic: Grossly intact, no focal deficits, moving all 4 extremities. Psychiatric: Normal mood and affect.  Laboratory Data: Lab Results  Component Value Date   WBC 12.5 (H) 11/13/2020   HGB 13.9 11/13/2020   HCT 41.9 11/13/2020   MCV 89.1 11/13/2020   PLT 325 11/13/2020    Lab Results  Component Value Date   CREATININE 0.86 05/21/2020    Urinalysis    Component Value Date/Time   COLORURINE CLEAR (A) 05/21/2020 1529   APPEARANCEUR TURBID (A) 05/21/2020 1529   APPEARANCEUR Cloudy (A) 11/10/2017 0851   LABSPEC 1.001 (L) 05/21/2020 1529   PHURINE 6.0 05/21/2020 1529   GLUCOSEU NEGATIVE 05/21/2020 1529   HGBUR MODERATE (A) 05/21/2020 1529   BILIRUBINUR NEGATIVE 05/21/2020 1529   BILIRUBINUR Negative 11/10/2017 0851   KETONESUR NEGATIVE 05/21/2020 1529   PROTEINUR NEGATIVE 05/21/2020 1529   NITRITE NEGATIVE 05/21/2020 1529   LEUKOCYTESUR MODERATE (A) 05/21/2020 1529    I have reviewed the labs.   Pertinent Imaging: No recent imaging since last visit  Assessment & Plan:    1. Neurogenic bladder - currently managed by indwelling Foley - patient not wanting to pursue another placement of an SP at this time - reviewed with patient that having the indwelling Foley increases her risk for rUTI's and possibly sepsis, especially in the setting of stool incontinence - she understands, but she is wanting to hold on SPT for now  2. Diarrhea, unspecified type - she would like to speak to another GI specialist concerning her diarrhea/constipation issues  - her hope is to control her bowels more effectively in order to either stop or reduce the amount of stool incontinence she is experiencing especially as she is considering keeping the indwelling Foley - Ambulatory referral to Gastroenterology   Return  for she will need a Foley cath excahnge in the first week of january .  These notes generated with voice recognition software. I apologize for typographical errors.  February, PA-C  [facility] [address]  [address] [place], [place] [phone]

## 2020-12-08 ENCOUNTER — Ambulatory Visit: Payer: Medicare HMO

## 2020-12-14 ENCOUNTER — Ambulatory Visit: Payer: Medicare HMO | Admitting: Urology

## 2020-12-23 ENCOUNTER — Other Ambulatory Visit: Payer: Self-pay

## 2020-12-23 ENCOUNTER — Ambulatory Visit (INDEPENDENT_AMBULATORY_CARE_PROVIDER_SITE_OTHER): Payer: Medicare HMO | Admitting: Physician Assistant

## 2020-12-23 ENCOUNTER — Telehealth: Payer: Self-pay | Admitting: Radiology

## 2020-12-23 ENCOUNTER — Encounter: Payer: Self-pay | Admitting: Physician Assistant

## 2020-12-23 DIAGNOSIS — N319 Neuromuscular dysfunction of bladder, unspecified: Secondary | ICD-10-CM | POA: Diagnosis not present

## 2020-12-23 DIAGNOSIS — R338 Other retention of urine: Secondary | ICD-10-CM | POA: Diagnosis not present

## 2020-12-23 DIAGNOSIS — T83198A Other mechanical complication of other urinary devices and implants, initial encounter: Secondary | ICD-10-CM

## 2020-12-23 LAB — BLADDER SCAN AMB NON-IMAGING

## 2020-12-23 NOTE — Telephone Encounter (Signed)
Patient called back and states that her catheter is completely clogged.    Appt made for today to see Sam.

## 2020-12-23 NOTE — Progress Notes (Signed)
Cath Change/ Replacement  Patient is present today for a catheter change due to catheter occlusion.  49ml of water was removed from the balloon, a 16FR foley cath was removed without difficulty.  Patient was cleaned and prepped in a sterile fashion with betadine. A 16 FR silicone foley cath was replaced into the bladder no complications were noted Urine return was noted 31ml and urine was yellow in color. The balloon was filled with 6ml of sterile water. A leg bag was attached for drainage.  Patient tolerated well, no complications noted.  Performed by: Carman Ching, PA-C   Additional notes/follow-up: Patient is accompanied to clinic today by her husband, who contributes to HPI.  They noticed her Foley catheter stopped draining yesterday and she has had significantly urinary leakage around her tubing since.  She denies abdominal discomfort associated with this.  They report a history of catheter occlusion and have not tried silicone catheters previously.  I discussed that silicone catheters may take longer to occlude due to their increased internal lumen diameter, however they are stiffer in nature and may be uncomfortable.  Patient wishes to try a silicone catheter at this time.  I exchanged the catheter for silicone in clinic today.  Notably, she was scheduled for monthly routine catheter change next week.  I am keeping this appointment for her to possibly switch back to latex catheter if she does not tolerate the silicone catheter well.  Otherwise, we will plan for monthly catheter exchange in clinic.

## 2020-12-23 NOTE — Telephone Encounter (Signed)
Patient states urine is leaking constantly around catheter. Asks for return call to [phone].

## 2020-12-28 ENCOUNTER — Encounter: Payer: Self-pay | Admitting: Urology

## 2020-12-28 ENCOUNTER — Ambulatory Visit: Payer: Medicare HMO | Admitting: Urology

## 2020-12-28 DIAGNOSIS — Z978 Presence of other specified devices: Secondary | ICD-10-CM

## 2020-12-28 DIAGNOSIS — N319 Neuromuscular dysfunction of bladder, unspecified: Secondary | ICD-10-CM

## 2020-12-28 LAB — BLADDER SCAN AMB NON-IMAGING: Scan Result: 45

## 2020-12-28 NOTE — Assessment & Plan Note (Signed)
Managed with an indwelling Foley exchanged ~ 30 days

## 2020-12-28 NOTE — Progress Notes (Signed)
12/28/2020 9:53 AM   Adrienne Strickland 1961-11-30 [id number]  Referring provider: Sharilyn Sites, MD No address on file  Chief Complaint  Patient presents with  . foley catheter issues    HPI: Adrienne Strickland is a 60 year old female with a neurogenic bladder secondary to MS with a chronic indwelling Foley with her SO, Derek.  Her Foley catheter became clogged again last week, so it was exchanged for a silicone catheter.    The silicone catheter has not been more uncomfortable than a latex catheter, but she is not having drainage into the catheter bag during the day.   They are irrigating her catheter in the evenings, but they have not been irrigating the catheter during the day.    She is having dry depends in the evening, but she is soaking her depends during the day.    She is drinking two cups of coffee and several glassed of water/diet ginger-ale up until bedtime.    She does have lower extremity edema during the day as well.    She had some blood in her catheter yesterday.    Adrienne Strickland is concerned she may have a kidney stone.    RUS in 07/2020 was normal.  Cystoscopy with Dr. Apolinar Junes in 08/2020 was negative.     Patient denies any modifying or aggravating factors.  Patient denies any dysuria or suprapubic/flank pain.  Patient denies any fevers, chills, nausea or vomiting.   PMH: Past Medical History:  Diagnosis Date  . Abnormality of gait 09/26/2013  . Arthritis   . Duodenal ulcer 02/27/2015  . Memory difficulty   . Multiple sclerosis (HCC) 09/26/2013  . Multiple sclerosis (HCC)   . Neurogenic bladder    self caths  . Urinary frequency     Surgical History: Past Surgical History:  Procedure Laterality Date  . BOTOX INJECTION N/A 12/13/2017   Procedure: BOTOX INJECTION;  Surgeon: Vanna Scotland, MD;  Location: [facility] ORS;  Service: Urology;  Laterality: N/A;  . CHOLECYSTECTOMY    . COLONOSCOPY WITH PROPOFOL N/A 05/22/2018   Procedure: COLONOSCOPY WITH PROPOFOL;   Surgeon: Christena Deem, MD;  Location: [facility] ENDOSCOPY;  Service: Endoscopy;  Laterality: N/A;  . CYSTOSCOPY N/A 12/13/2017   Procedure: CYSTOSCOPY;  Surgeon: Vanna Scotland, MD;  Location: [facility] ORS;  Service: Urology;  Laterality: N/A;  . GALLBLADDER SURGERY      Home Medications:  Allergies as of 12/28/2020      Reactions   Sulfa Antibiotics Nausea And Vomiting   Tetanus Toxoids Swelling   Extreme swelling at injection site      Medication List       Accurate as of December 28, 2020  9:53 AM. If you have any questions, ask your nurse or doctor.        Ampyra 10 MG Tb12 Generic drug: dalfampridine Take 10 mg by mouth 2 (two) times daily.   baclofen 10 MG tablet Commonly known as: LIORESAL Take 10 mg by mouth 2 (two) times daily.   donepezil 10 MG tablet Commonly known as: ARICEPT Take 10 mg by mouth at bedtime.   fluticasone 50 MCG/ACT nasal spray Commonly known as: FLONASE Place 2 sprays into both nostrils daily.   Nortrel 1/35 (28) tablet Generic drug: norethindrone-ethinyl estradiol 1/35 Take 1 tablet by mouth daily.   oxycodone 5 MG capsule Commonly known as: OXY-IR Take 5 mg by mouth every 6 (six) hours.   vitamin B-12 1000 MCG tablet Commonly known as: CYANOCOBALAMIN Take 1,000  mcg by mouth daily.   Vitamin D-3 25 MCG (1000 UT) Caps Take 1,000 Units by mouth daily.       Allergies:  Allergies  Allergen Reactions  . Sulfa Antibiotics Nausea And Vomiting  . Tetanus Toxoids Swelling    Extreme swelling at injection site    Family History: Family History  Problem Relation Age of Onset  . Seizures Mother   . Emphysema Father   . Heart disease Brother   . Kidney disease Neg Hx   . Prostate cancer Neg Hx   . Bladder Cancer Neg Hx     Social History:  reports that she has never smoked. She has never used smokeless tobacco. She reports current alcohol use. She reports that she does not use drugs.  ROS: Pertinent ROS in HPI  Physical  Exam: CV:  Bilateral pedal edema. GI: Abdomen is soft, non tender, non distended, no abdominal masses.  SPT site is well healed.  GU: No CVA tenderness.  No bladder fullness or masses.  Foley catheter in place draining orange tinged urine.  Depends soaked with urine.   Neurologic: Grossly intact, no focal deficits, moving all 4 extremities. Psychiatric: Normal mood and affect.  Laboratory Data: Lab Results  Component Value Date   WBC 12.5 (H) 11/13/2020   HGB 13.9 11/13/2020   HCT 41.9 11/13/2020   MCV 89.1 11/13/2020   PLT 325 11/13/2020    Lab Results  Component Value Date   CREATININE 0.86 05/21/2020    Urinalysis    Component Value Date/Time   COLORURINE CLEAR (A) 05/21/2020 1529   APPEARANCEUR TURBID (A) 05/21/2020 1529   APPEARANCEUR Cloudy (A) 11/10/2017 0851   LABSPEC 1.001 (L) 05/21/2020 1529   PHURINE 6.0 05/21/2020 1529   GLUCOSEU NEGATIVE 05/21/2020 1529   HGBUR MODERATE (A) 05/21/2020 1529   BILIRUBINUR NEGATIVE 05/21/2020 1529   BILIRUBINUR Negative 11/10/2017 0851   KETONESUR NEGATIVE 05/21/2020 1529   PROTEINUR NEGATIVE 05/21/2020 1529   NITRITE NEGATIVE 05/21/2020 1529   LEUKOCYTESUR MODERATE (A) 05/21/2020 1529  I have reviewed the labs.   Pertinent Imaging: Results for RASHI, GRANIER (MRN [id number]) as of 12/28/2020 09:42  Ref. Range 12/28/2020 09:41  Scan Result Unknown 45 mL   Bladder Irrigation Due to complaint of decrease flow into leg bag, the patient is present today for a bladder irrigation. Patient was cleaned and prepped in a sterile fashion. 120 ml of saline/sterile water was instilled and irrigated into the bladder with a 57ml [place] syringe through the catheter in place.  Saline solution irrigated easily.  A 5 mm piece of tissue was obtained.  No gross hematuria appreciated.  After irrigation urine flow was noted no complications were noted catheter is now draining fine.  Catheter was reattached to the leg bag for drainage. Patient tolerated  well.   Performed by: Michiel Cowboy, PA-C  Assessment & Plan:    60 year old female with neurogenic bladder secondary to MS who continues to have issues with sediment buildup/clogging of catheter and leakage around the catheter.  Patient tolerating silicone catheter comfort wise, but continues to have significant leakage around the catheter during the day.  There is a question to whether this is positional or due to sediment.  Not likely due to kidney/bladder stone as recent RUS and cysto were negative  Recommendations - they will irrigate the catheter both during the day and the night to see if there is improvement with the leakage and drainage into the bag - will  also discuss with Dr. Erlene Quan if injecting Botox into her bladder again would be helpful if increasing the irrigation does not improve the leakage/drainage    Return for as scheduled for Foley exchange.  These notes generated with voice recognition software. I apologize for typographical errors.  Adrienne Council, PA-C  [facility] [address]  [place], [place] [address] [phone]  I spent 30 minutes on the day of the encounter  Irrigating the catheter  Investigating behavioral issues that may be contributing to her issues  Reviewing imaging and results of recent procedures

## 2021-01-11 ENCOUNTER — Ambulatory Visit: Payer: Medicare HMO | Admitting: Gastroenterology

## 2021-01-17 NOTE — Progress Notes (Signed)
Cath Change/ Replacement  Patient is present today for a catheter change due to urinary retention.  Her Foley fell out this am while she was having a BM with the balloon still intact.  She denied any pain or gross hematuria.  Patient was cleaned and prepped in a sterile fashion with betadine. A 16 FR silicone foley cath was replaced into the bladder no complications were noted   Urine return was noted 20 ml and urine was yellow clear in color. The balloon was filled with 10ml of sterile water. A leg bag was attached for drainage.  A night bag was also given to the patient and patient was given instruction on how to change from one bag to another. Patient was given proper instruction on catheter care.    Performed by: Michiel Cowboy, PA-C  Follow up: One month for catheter exchange and in 08/2021 for RUS and cysto

## 2021-01-18 ENCOUNTER — Encounter: Payer: Self-pay | Admitting: Urology

## 2021-01-18 ENCOUNTER — Ambulatory Visit (INDEPENDENT_AMBULATORY_CARE_PROVIDER_SITE_OTHER): Payer: Medicare HMO | Admitting: Urology

## 2021-01-18 ENCOUNTER — Other Ambulatory Visit: Payer: Self-pay

## 2021-01-18 DIAGNOSIS — Z978 Presence of other specified devices: Secondary | ICD-10-CM

## 2021-02-21 NOTE — Progress Notes (Signed)
Cath Change/ Replacement Patient is present today for a catheter change due to urinary retention.  16 ml of water was removed from the balloon, a 16 FR foley silicone cath was removed with out difficulty.  Patient was cleaned and prepped in a sterile fashion with betadine. A 16 FR foley silicone cath was replaced into the bladder no complications were noted Urine return was noted 30 ml and urine was yellow clear in color. The balloon was filled with 8ml of sterile water. A leg bag was attached for drainage.  A night bag was also given to the patient and patient was given instruction on how to change from one bag to another. Patient was given proper instruction on catheter care.    Performed by: Myself   Follow up: One month for catheter exchange

## 2021-02-22 ENCOUNTER — Ambulatory Visit: Payer: Medicare HMO | Admitting: Gastroenterology

## 2021-02-22 ENCOUNTER — Ambulatory Visit (INDEPENDENT_AMBULATORY_CARE_PROVIDER_SITE_OTHER): Payer: Medicare HMO | Admitting: Urology

## 2021-02-22 ENCOUNTER — Other Ambulatory Visit: Payer: Self-pay

## 2021-02-22 ENCOUNTER — Encounter: Payer: Self-pay | Admitting: Urology

## 2021-02-22 VITALS — BP 150/80 | HR 80 | Ht 65.0 in | Wt 200.0 lb

## 2021-02-22 DIAGNOSIS — Z978 Presence of other specified devices: Secondary | ICD-10-CM

## 2021-03-08 ENCOUNTER — Ambulatory Visit: Payer: Medicare HMO | Admitting: Gastroenterology

## 2021-03-19 NOTE — Progress Notes (Signed)
Cath Change/ Replacement Patient is present today for a catheter change due to urinary retention.  18 ml of water was removed from the balloon, a 16 FR silicone foley cath was removed with out difficulty.  Patient was cleaned and prepped in a sterile fashion with betadine. An 18FR silicone coude foley cath was replaced into the bladder no complications were noted   We needed to use the coude as that was the only suitable silicone catheter available to me in [place].  Urine return was noted 50 ml and urine was yellow clear in color. The balloon was filled with 21ml of sterile water. A leg bag was attached for drainage.  A night bag was also given to the patient and patient was given instruction on how to change from one bag to another. Patient was given proper instruction on catheter care.    Performed by: Michiel Cowboy, PA-C   Follow up: One month for Foley catheter exchange

## 2021-03-22 ENCOUNTER — Ambulatory Visit: Payer: Medicare HMO | Admitting: Urology

## 2021-03-22 ENCOUNTER — Encounter: Payer: Self-pay | Admitting: Urology

## 2021-03-22 ENCOUNTER — Other Ambulatory Visit: Payer: Self-pay

## 2021-03-22 VITALS — BP 137/88 | HR 76 | Ht 66.0 in | Wt 200.0 lb

## 2021-03-22 DIAGNOSIS — Z978 Presence of other specified devices: Secondary | ICD-10-CM

## 2021-04-25 NOTE — Progress Notes (Signed)
Cath Change/ Replacement  Patient is present today for a catheter change due to urinary retention.  18 ml of water was removed from the balloon, an 18 FR silicone coude foley cath was removed with out difficulty.  Patient was cleaned and prepped in a sterile fashion with betadine. A 16 FR silicone foley cath was replaced into the bladder no complications were noted Urine return was noted 10 ml and urine was yellow clear in color. The balloon was filled with 33ml of sterile water. A leg bag was attached for drainage.  A night bag was also given to the patient and patient was given instruction on how to change from one bag to another. Patient was given proper instruction on catheter care.    Performed by: Michiel Cowboy, PA-C  Follow up: One month for Foley catheter exchange

## 2021-04-26 ENCOUNTER — Encounter: Payer: Self-pay | Admitting: Urology

## 2021-04-26 ENCOUNTER — Ambulatory Visit: Payer: Medicare Other | Admitting: Urology

## 2021-04-26 ENCOUNTER — Other Ambulatory Visit: Payer: Self-pay

## 2021-04-26 VITALS — BP 137/76 | HR 80 | Ht 65.0 in | Wt 200.0 lb

## 2021-04-26 DIAGNOSIS — Z978 Presence of other specified devices: Secondary | ICD-10-CM

## 2021-05-30 NOTE — Progress Notes (Signed)
Urological history: 1. Neurogenic bladder -contributing factor of MS -failed OAB medications, CIC and Botox  -managed with indwelling Foley   Cath Change/ Replacement  Patient is present today for a catheter change due to urinary retention.  15 ml of water was removed from the balloon, a 16 silicone FR foley cath was removed with out difficulty.  Patient was cleaned and prepped in a sterile fashion with betadine. A 16 FR silicone foley cath was replaced into the bladder no complications were noted Urine return was noted 5 ml and urine was yellow clear in color. The balloon was filled with 60ml of sterile water. A leg bag was attached for drainage.  A night bag was also given to the patient and patient was given instruction on how to change from one bag to another. Patient was given proper instruction on catheter care.    Performed by: Michiel Cowboy, PA-C  Follow up: One month for Foley catheter exchange.  RUS and cysto due in 08/2021

## 2021-05-31 ENCOUNTER — Encounter: Payer: Self-pay | Admitting: Urology

## 2021-05-31 ENCOUNTER — Ambulatory Visit: Payer: Medicare Other | Admitting: Urology

## 2021-05-31 ENCOUNTER — Other Ambulatory Visit: Payer: Self-pay

## 2021-05-31 VITALS — BP 143/84 | HR 91 | Ht 65.0 in | Wt 200.0 lb

## 2021-05-31 DIAGNOSIS — Z978 Presence of other specified devices: Secondary | ICD-10-CM

## 2021-06-20 ENCOUNTER — Emergency Department
Admission: EM | Admit: 2021-06-20 | Discharge: 2021-06-20 | Disposition: A | Discharge status: Home / Self Care | Payer: Medicare Other | Attending: Emergency Medicine | Admitting: Emergency Medicine

## 2021-06-20 ENCOUNTER — Encounter: Payer: Self-pay | Admitting: Emergency Medicine

## 2021-06-20 ENCOUNTER — Other Ambulatory Visit: Payer: Self-pay

## 2021-06-20 DIAGNOSIS — G35 Multiple sclerosis: Secondary | ICD-10-CM | POA: Diagnosis not present

## 2021-06-20 DIAGNOSIS — N319 Neuromuscular dysfunction of bladder, unspecified: Secondary | ICD-10-CM

## 2021-06-20 DIAGNOSIS — N312 Flaccid neuropathic bladder, not elsewhere classified: Secondary | ICD-10-CM | POA: Insufficient documentation

## 2021-06-20 DIAGNOSIS — R531 Weakness: Secondary | ICD-10-CM | POA: Diagnosis present

## 2021-06-20 DIAGNOSIS — W19XXXA Unspecified fall, initial encounter: Secondary | ICD-10-CM | POA: Diagnosis not present

## 2021-06-20 DIAGNOSIS — Y92019 Unspecified place in single-family (private) house as the place of occurrence of the external cause: Secondary | ICD-10-CM | POA: Insufficient documentation

## 2021-06-20 NOTE — ED Triage Notes (Signed)
Pt brought in by EMS with a complaints of increased leg weakness from her MS. She denies weakness anywhere else. She states she is suppose to Wheelchair soon. Denies any pain. States that the weakness in the legs are causing her to fall more.

## 2021-06-20 NOTE — ED Provider Notes (Signed)
[facility] Emergency Department Provider Note  ____________________________________________   Event Date/Time   First MD Initiated Contact with Patient 06/20/21 [phone]     (approximate)  I have reviewed the triage vital signs and the nursing notes.   HISTORY  Chief Complaint No chief complaint on file.    HPI Adrienne Strickland is a 60 y.o. female with history of relatively advanced multiple sclerosis including neurogenic bladder.  She presents by EMS after a fall at home.  She reports that her indwelling Foley catheter came out yesterday.  She and her husband talked about it but decided to hold off on doing anything about it until tomorrow (Monday) when she can go to clinic and have it replaced.  She can generally urinate some on her own so they did not think it would be a problem.  However her generalized weakness particularly her weakness in her legs has gotten advanced enough that they are awaiting a wheelchair at home.  She got up in the middle of the night to go to the bathroom and unfortunately fell.  She did not strike her head and did not sustain any injury but EMS was called due to the fall.  She said that she feels fine.  She is having no pain other than her usual multiple sclerosis pain for which she takes chronic pain medicine.  She has no new pain or weakness in her arms or legs and she denies a headache and neck pain.  She is having no chest pain or shortness of breath, no abdominal pain and no nausea nor vomiting.  The onset of the event was acute but she says it was relatively mild that she feels fine right now.     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   Falls frequently 05/27/2020   Right leg numbness 05/19/2020   Right leg weakness 05/19/2020   Unsteadiness on feet 03/02/2020   Constipation 04/29/2016   Seasonal allergic rhinitis due to pollen 03/22/2016   Pain in both lower legs 01/14/2016   Urinary frequency 10/01/2015   Neurogenic bladder 10/01/2015   Chronic pain syndrome 06/18/2015   Duodenal ulcer with hemorrhage 04/24/2015   Pain, dental 04/24/2015   Status post tooth extraction 04/24/2015   Arthritis 01/20/2015   Extremity pain 05/20/2014   Multiple sclerosis (HCC) 09/26/2013   Abnormality of gait 09/26/2013   Dysuria 04/19/2013   Mild cognitive impairment 04/19/2013    Past Surgical History:  Procedure Laterality Date   BOTOX INJECTION N/A 12/13/2017   Procedure: BOTOX INJECTION;  Surgeon: Vanna Scotland, MD;  Location: [facility] ORS;  Service: Urology;  Laterality: N/A;   CHOLECYSTECTOMY     COLONOSCOPY WITH PROPOFOL N/A 05/22/2018   Procedure: COLONOSCOPY WITH PROPOFOL;  Surgeon: Christena Deem, MD;  Location: [facility] ENDOSCOPY;  Service: Endoscopy;  Laterality: N/A;   CYSTOSCOPY N/A 12/13/2017   Procedure: CYSTOSCOPY;  Surgeon: Vanna Scotland, MD;  Location: [facility] ORS;  Service: Urology;  Laterality: N/A;  GALLBLADDER SURGERY      Prior to Admission medications   Medication Sig Start Date End Date Taking? Authorizing Provider  baclofen (LIORESAL) 10 MG tablet Take 10 mg by mouth 2 (two) times daily.     [provider]  Cholecalciferol (VITAMIN D-3) 1000 UNITS CAPS Take 1,000 Units by mouth daily.     [provider]  dalfampridine 10 MG TB12 Take 10 mg by mouth 2 (two) times daily.    [provider]  donepezil (ARICEPT) 10 MG tablet Take 10 mg by mouth at bedtime.     [provider]  fluticasone (FLONASE) 50  MCG/ACT nasal spray Place 2 sprays into both nostrils daily. 08/08/15   Domenick Gong, MD  oxycodone (OXY-IR) 5 MG capsule Take 5 mg by mouth every 6 (six) hours.    [provider]  vitamin B-12 (CYANOCOBALAMIN) 1000 MCG tablet Take 1,000 mcg by mouth daily.    [provider]    Allergies Sulfa antibiotics and Tetanus toxoids  Family History  Problem Relation Age of Onset   Seizures Mother    Emphysema Father    Heart disease Brother    Kidney disease Neg Hx    Prostate cancer Neg Hx    Bladder Cancer Neg Hx     Social History Social History   Tobacco Use   Smoking status: Never   Smokeless tobacco: Never  Vaping Use   Vaping Use: Never used  Substance Use Topics   Alcohol use: Yes    Alcohol/week: 0.0 standard drinks    Comment: drinks everyday 2 beers   Drug use: No    Review of Systems Constitutional: No fever/chills Eyes: No visual changes. ENT: No sore throat. Cardiovascular: Denies chest pain. Respiratory: Denies shortness of breath. Gastrointestinal: No abdominal pain.  No nausea, no vomiting.  No diarrhea.  No constipation. Genitourinary: Negative for dysuria. Musculoskeletal: Negative for neck pain.  Negative for back pain. Integumentary: Negative for rash. Neurological: Negative for headache.  No new weakness nor numbness/tingling.   ____________________________________________   PHYSICAL EXAM:  VITAL SIGNS: ED Triage Vitals  Enc Vitals Group     BP 06/20/21 0430 140/66     Pulse Rate 06/20/21 0431 99     Resp 06/20/21 0431 20     Temp 06/20/21 0431 98.5 F (36.9 C)     Temp Source 06/20/21 0431 Oral     SpO2 06/20/21 0431 97 %     Weight 06/20/21 0432 90.7 kg (200 lb)     Height 06/20/21 0432 1.651 m (5\' 5" )     Head Circumference --      Peak Flow --      Pain Score 06/20/21 0432 0     Pain Loc --      Pain Edu? --      Excl. in GC? --     Constitutional: Alert and oriented.  Eyes: Conjunctivae are normal.   Head: Atraumatic. Nose: No congestion/rhinnorhea. Mouth/Throat: Patient is wearing a mask. Neck: No stridor.  No meningeal signs.   Cardiovascular: Normal rate, regular rhythm. Good peripheral circulation. Respiratory: Normal respiratory effort.  No retractions. Gastrointestinal: Soft and nontender. No distention.  Musculoskeletal: No tenderness to palpation of her cervical spine.  No pain with movement, both active and passive, of her arms and legs.  She has some mild pitting edema in her lower extremities that appears chronic.  No tenderness to palpation. Neurologic:  Normal speech and language. No gross focal neurologic deficits  are appreciated.  She is able to push against me with her legs, raising them up off of the bed against gravity, and she has good grip strength in bilateral upper extremities.  She says that she feels normal in terms of her extremity strength. Skin:  Skin is warm, dry and intact. Psychiatric: Mood and affect are normal. Speech and behavior are normal.  ____________________________________________    INITIAL IMPRESSION / MDM / ASSESSMENT AND PLAN / ED COURSE  As part of my medical decision making, I reviewed the following data within the electronic MEDICAL RECORD NUMBER Nursing notes reviewed and incorporated, Old chart reviewed, and Notes from prior ED visits   Differential diagnosis includes, but is not limited to, mechanical fall, syncope, acute infection, worsening CNS disease.  Patient's vital signs are stable and within normal limits.  She was observed in the ED for about 3 hours.  The patient has no complaints or concerns and she said that everything that happened occurred because her Foley came out and she got up to go to the bathroom, which she ordinarily would not do.  I asked her if she has any concerns or if she feels that any additional evaluation is needed, and she said no.  I asked her if it would benefit her if we put in a new Foley catheter and  discharged her to go home and follow-up with her primary care doctor, and she said that would be her preference and "that would be wonderful".  Based on my evaluation and interview with her, I have no concerns that she has a new or emergent medical condition and that the patient and I agree that there is no indication for further work-up at this time.  The nurses will replace her Foley catheter and she will be discharged for her husband to pick her up and take her home.  She says she will call her doctor to schedule follow-up appointment tomorrow.  I gave my usual and customary return precautions.           ____________________________________________  FINAL CLINICAL IMPRESSION(S) / ED DIAGNOSES  Final diagnoses:  Fall, initial encounter  Neurogenic bladder  Multiple sclerosis (HCC)     MEDICATIONS GIVEN DURING THIS VISIT:  Medications - No data to display   ED Discharge Orders     None        Note:  This document was prepared using Dragon voice recognition software and may include unintentional dictation errors.   Loleta Rose, MD 06/20/21 [phone]

## 2021-06-20 NOTE — Discharge Instructions (Addendum)
Fortunately there is no evidence that you sustained an injury during your fall.  Since this is due to your chronic multiple sclerosis and not a new issue, we all agree that it is appropriate for you to be discharged home to follow-up with your regular doctor.  Since the issue arose as result of your Foley catheter becoming dislodged yesterday, we replaced the Foley catheter.  Please follow-up with your doctor at the next available opportunity and return to the nearest emergency department if you develop any new or worsening issues that concern you.

## 2021-06-30 ENCOUNTER — Ambulatory Visit: Payer: Medicare Other | Admitting: Urology

## 2021-06-30 ENCOUNTER — Other Ambulatory Visit: Payer: Self-pay

## 2021-06-30 DIAGNOSIS — R339 Retention of urine, unspecified: Secondary | ICD-10-CM | POA: Diagnosis not present

## 2021-06-30 LAB — MICROSCOPIC EXAMINATION: WBC, UA: >30 /hpf — ABNORMAL HIGH (ref 0–5)

## 2021-06-30 LAB — URINALYSIS, COMPLETE
Bilirubin, UA: NEGATIVE
Glucose, UA: NEGATIVE
Ketones, UA: NEGATIVE
Nitrite, UA: POSITIVE — AB
Protein,UA: NEGATIVE
Specific Gravity, UA: 1.01 (ref 1.005–1.030)
Urobilinogen, Ur: 0.2 mg/dL (ref 0.2–1.0)
pH, UA: 6.5 (ref 5.0–7.5)

## 2021-06-30 NOTE — Progress Notes (Signed)
Simple Catheter Placement  Due to urinary retention patient is present today for a foley cath placement.  Patient was cleaned and prepped in a sterile fashion with betadine. A 16 FR silicone foley catheter was inserted, urine return was noted  300 ml, urine was yellow in color.  The balloon was filled with 10cc of sterile water.  A leg bag was attached for drainage. Patient was also given a night bag to take home and was given instruction on how to change from one bag to another.  Patient was given instruction on proper catheter care.  Patient tolerated well, no complications were noted   Performed by: Quintella Reichert, PA-S and Michiel Cowboy, PA-C   Additional notes/ Follow up: On exam she is found to have purulent material through her perineum.  She was cleansed and no vaginal discharge was appreciated.  When a new Foley catheter was placed she had pus return with her urine.  We have sent a catheterized specimen for UA and urine culture.  One month for Foley catheter exchange.  RUS and cysto due in 08/2021

## 2021-07-04 LAB — CULTURE, URINE COMPREHENSIVE

## 2021-07-05 ENCOUNTER — Ambulatory Visit: Payer: Medicare Other | Admitting: Urology

## 2021-07-05 ENCOUNTER — Other Ambulatory Visit: Payer: Self-pay | Admitting: *Deleted

## 2021-07-05 MED ORDER — NITROFURANTOIN MONOHYD MACRO 100 MG PO CAPS: 100.0000 mg | ORAL_CAPSULE | Freq: Two times a day (BID) | ORAL | 0 refills | Status: AC

## 2021-07-26 ENCOUNTER — Ambulatory Visit: Payer: Self-pay | Admitting: Urology

## 2021-07-26 DIAGNOSIS — Z978 Presence of other specified devices: Secondary | ICD-10-CM

## 2021-07-28 ENCOUNTER — Other Ambulatory Visit: Payer: Self-pay

## 2021-07-28 ENCOUNTER — Emergency Department: Payer: Medicare Other

## 2021-07-28 ENCOUNTER — Inpatient Hospital Stay
Admission: EM | Admit: 2021-07-28 | Discharge: 2021-07-31 | DRG: 698 | Disposition: A | Discharge status: Home Health | Payer: Medicare Other | Attending: Family Medicine | Admitting: Family Medicine

## 2021-07-28 DIAGNOSIS — Z20822 Contact with and (suspected) exposure to covid-19: Secondary | ICD-10-CM | POA: Diagnosis present

## 2021-07-28 DIAGNOSIS — E876 Hypokalemia: Secondary | ICD-10-CM | POA: Diagnosis present

## 2021-07-28 DIAGNOSIS — Z8711 Personal history of peptic ulcer disease: Secondary | ICD-10-CM | POA: Diagnosis not present

## 2021-07-28 DIAGNOSIS — G35 Multiple sclerosis: Secondary | ICD-10-CM | POA: Diagnosis present

## 2021-07-28 DIAGNOSIS — N390 Urinary tract infection, site not specified: Secondary | ICD-10-CM | POA: Diagnosis present

## 2021-07-28 DIAGNOSIS — Z8249 Family history of ischemic heart disease and other diseases of the circulatory system: Secondary | ICD-10-CM | POA: Diagnosis not present

## 2021-07-28 DIAGNOSIS — A4151 Sepsis due to Escherichia coli [E. coli]: Secondary | ICD-10-CM | POA: Diagnosis present

## 2021-07-28 DIAGNOSIS — N319 Neuromuscular dysfunction of bladder, unspecified: Secondary | ICD-10-CM | POA: Diagnosis present

## 2021-07-28 DIAGNOSIS — G3184 Mild cognitive impairment, so stated: Secondary | ICD-10-CM | POA: Diagnosis present

## 2021-07-28 DIAGNOSIS — K5900 Constipation, unspecified: Secondary | ICD-10-CM | POA: Diagnosis not present

## 2021-07-28 DIAGNOSIS — T83511A Infection and inflammatory reaction due to indwelling urethral catheter, initial encounter: Principal | ICD-10-CM | POA: Diagnosis present

## 2021-07-28 DIAGNOSIS — Z9049 Acquired absence of other specified parts of digestive tract: Secondary | ICD-10-CM

## 2021-07-28 DIAGNOSIS — A419 Sepsis, unspecified organism: Secondary | ICD-10-CM | POA: Diagnosis present

## 2021-07-28 DIAGNOSIS — Z79899 Other long term (current) drug therapy: Secondary | ICD-10-CM

## 2021-07-28 DIAGNOSIS — Z882 Allergy status to sulfonamides status: Secondary | ICD-10-CM

## 2021-07-28 DIAGNOSIS — Z825 Family history of asthma and other chronic lower respiratory diseases: Secondary | ICD-10-CM

## 2021-07-28 DIAGNOSIS — Y846 Urinary catheterization as the cause of abnormal reaction of the patient, or of later complication, without mention of misadventure at the time of the procedure: Secondary | ICD-10-CM | POA: Diagnosis present

## 2021-07-28 LAB — COMPREHENSIVE METABOLIC PANEL
ALT: 27 U/L (ref 0–44)
AST: 27 U/L (ref 15–41)
Albumin: 3 g/dL — ABNORMAL LOW (ref 3.5–5.0)
Alkaline Phosphatase: 95 U/L (ref 38–126)
Anion gap: 11 (ref 5–15)
BUN: 18 mg/dL (ref 6–20)
CO2: 22 mmol/L (ref 22–32)
Calcium: 8.2 mg/dL — ABNORMAL LOW (ref 8.9–10.3)
Chloride: 106 mmol/L (ref 98–111)
Creatinine, Ser: 1.07 mg/dL — ABNORMAL HIGH (ref 0.44–1.00)
GFR, Estimated: >60 mL/min (ref >60)
Glucose, Bld: 120 mg/dL — ABNORMAL HIGH (ref 70–99)
Potassium: 3.4 mmol/L — ABNORMAL LOW (ref 3.5–5.1)
Sodium: 139 mmol/L (ref 135–145)
Total Bilirubin: 1.1 mg/dL (ref 0.3–1.2)
Total Protein: 6.1 g/dL — ABNORMAL LOW (ref 6.5–8.1)

## 2021-07-28 LAB — CBC WITH DIFFERENTIAL/PLATELET
Abs Immature Granulocytes: 0.08 10*3/uL — ABNORMAL HIGH (ref 0.00–0.07)
Basophils Absolute: 0.1 10*3/uL (ref 0.0–0.1)
Basophils Relative: 0 %
Eosinophils Absolute: 0 10*3/uL (ref 0.0–0.5)
Eosinophils Relative: 0 %
HCT: 36.5 % (ref 36.0–46.0)
Hemoglobin: 12.4 g/dL (ref 12.0–15.0)
Immature Granulocytes: 1 %
Lymphocytes Relative: 5 %
Lymphs Abs: 0.5 10*3/uL — ABNORMAL LOW (ref 0.7–4.0)
MCH: 30.2 pg (ref 26.0–34.0)
MCHC: 34 g/dL (ref 30.0–36.0)
MCV: 89 fL (ref 80.0–100.0)
Monocytes Absolute: 0.8 10*3/uL (ref 0.1–1.0)
Monocytes Relative: 7 %
Neutro Abs: 9.8 10*3/uL — ABNORMAL HIGH (ref 1.7–7.7)
Neutrophils Relative %: 87 %
Platelets: 200 10*3/uL (ref 150–400)
RBC: 4.1 MIL/uL (ref 3.87–5.11)
RDW: 13.2 % (ref 11.5–15.5)
WBC: 11.2 10*3/uL — ABNORMAL HIGH (ref 4.0–10.5)
nRBC: 0 % (ref 0.0–0.2)

## 2021-07-28 LAB — URINALYSIS, COMPLETE (UACMP) WITH MICROSCOPIC
Bilirubin Urine: NEGATIVE
Glucose, UA: NEGATIVE mg/dL
Ketones, ur: NEGATIVE mg/dL
Nitrite: NEGATIVE
Protein, ur: 100 mg/dL — AB
Specific Gravity, Urine: 1.015 (ref 1.005–1.030)
WBC, UA: >50 WBC/hpf — ABNORMAL HIGH (ref 0–5)
pH: 6 (ref 5.0–8.0)

## 2021-07-28 LAB — PREGNANCY, URINE: Preg Test, Ur: NEGATIVE

## 2021-07-28 LAB — PROTIME-INR
INR: 1.3 — ABNORMAL HIGH (ref 0.8–1.2)
Prothrombin Time: 16.1 seconds — ABNORMAL HIGH (ref 11.4–15.2)

## 2021-07-28 LAB — PROCALCITONIN: Procalcitonin: 3.77 ng/mL

## 2021-07-28 LAB — APTT: aPTT: 41 seconds — ABNORMAL HIGH (ref 24–36)

## 2021-07-28 LAB — RESP PANEL BY RT-PCR (FLU A&B, COVID) ARPGX2
Influenza A by PCR: NEGATIVE
Influenza B by PCR: NEGATIVE
SARS Coronavirus 2 by RT PCR: NEGATIVE

## 2021-07-28 LAB — LACTIC ACID, PLASMA: Lactic Acid, Venous: 1.2 mmol/L (ref 0.5–1.9)

## 2021-07-28 MED ORDER — BACLOFEN 10 MG PO TABS
10.0000 mg | ORAL_TABLET | Freq: Two times a day (BID) | ORAL | Status: DC
  Administered 2021-07-28 – 2021-07-31 (×6): 10 mg via ORAL
  Filled 2021-07-28 (×6): qty 1

## 2021-07-28 MED ORDER — DIMETHYL FUMARATE 120 & 240 MG PO MISC: Freq: Two times a day (BID) | ORAL | Status: DC

## 2021-07-28 MED ORDER — OXYCODONE HCL 5 MG PO TABS
5.0000 mg | ORAL_TABLET | Freq: Four times a day (QID) | ORAL | Status: DC
Start: 2021-07-28 — End: 2021-07-30
  Administered 2021-07-28 – 2021-07-30 (×8): 5 mg via ORAL
  Filled 2021-07-28 (×8): qty 1

## 2021-07-28 MED ORDER — DALFAMPRIDINE ER 10 MG PO TB12: 10.0000 mg | ORAL_TABLET | Freq: Two times a day (BID) | ORAL | Status: DC

## 2021-07-28 MED ORDER — MEMANTINE HCL 5 MG PO TABS
10.0000 mg | ORAL_TABLET | Freq: Two times a day (BID) | ORAL | Status: DC
  Administered 2021-07-28 – 2021-07-31 (×6): 10 mg via ORAL
  Filled 2021-07-28 (×6): qty 2

## 2021-07-28 MED ORDER — CHLORHEXIDINE GLUCONATE CLOTH 2 % EX PADS
6.0000 | MEDICATED_PAD | Freq: Every day | CUTANEOUS | Status: DC
  Administered 2021-07-29 – 2021-07-31 (×3): 6 via TOPICAL

## 2021-07-28 MED ORDER — FLUTICASONE PROPIONATE 50 MCG/ACT NA SUSP
2.0000 | Freq: Every day | NASAL | Status: DC
  Administered 2021-07-29 – 2021-07-31 (×2): 2 via NASAL
  Filled 2021-07-28: qty 16

## 2021-07-28 MED ORDER — VITAMIN D 25 MCG (1000 UNIT) PO TABS
1000.0000 [IU] | ORAL_TABLET | Freq: Every day | ORAL | Status: DC
  Administered 2021-07-29 – 2021-07-31 (×3): 1000 [IU] via ORAL
  Filled 2021-07-28 (×3): qty 1

## 2021-07-28 MED ORDER — ACETAMINOPHEN 325 MG PO TABS
650.0000 mg | ORAL_TABLET | Freq: Four times a day (QID) | ORAL | Status: DC | PRN
  Administered 2021-07-28: 21:00:00 650 mg via ORAL
  Filled 2021-07-28: qty 2

## 2021-07-28 MED ORDER — SODIUM CHLORIDE 0.9 % IV SOLN
1.0000 g | Freq: Once | INTRAVENOUS | Status: AC
  Administered 2021-07-28: 1 g via INTRAVENOUS
  Filled 2021-07-28: qty 10

## 2021-07-28 MED ORDER — SODIUM CHLORIDE 0.9 % IV SOLN
1.0000 g | INTRAVENOUS | Status: DC
  Filled 2021-07-28: qty 10

## 2021-07-28 MED ORDER — ENOXAPARIN SODIUM 60 MG/0.6ML IJ SOSY
0.5000 mg/kg | PREFILLED_SYRINGE | INTRAMUSCULAR | Status: DC
  Administered 2021-07-28 – 2021-07-30 (×3): 45 mg via SUBCUTANEOUS
  Filled 2021-07-28 (×3): qty 0.6

## 2021-07-28 MED ORDER — LACTATED RINGERS IV SOLN: INTRAVENOUS | Status: AC

## 2021-07-28 MED ORDER — POTASSIUM CHLORIDE CRYS ER 20 MEQ PO TBCR
40.0000 meq | EXTENDED_RELEASE_TABLET | Freq: Once | ORAL | Status: AC
  Administered 2021-07-28: 40 meq via ORAL
  Filled 2021-07-28: qty 2

## 2021-07-28 MED ORDER — ONDANSETRON HCL 4 MG/2ML IJ SOLN: 4.0000 mg | Freq: Four times a day (QID) | INTRAMUSCULAR | Status: DC | PRN

## 2021-07-28 MED ORDER — VITAMIN B-12 1000 MCG PO TABS
1000.0000 ug | ORAL_TABLET | Freq: Every day | ORAL | Status: DC
  Administered 2021-07-29 – 2021-07-31 (×3): 1000 ug via ORAL
  Filled 2021-07-28 (×3): qty 1

## 2021-07-28 MED ORDER — LACTATED RINGERS IV BOLUS
1000.0000 mL | Freq: Once | INTRAVENOUS | Status: AC
  Administered 2021-07-28: 1000 mL via INTRAVENOUS

## 2021-07-28 MED ORDER — ACETAMINOPHEN 650 MG RE SUPP: 650.0000 mg | Freq: Four times a day (QID) | RECTAL | Status: DC | PRN

## 2021-07-28 MED ORDER — ONDANSETRON HCL 4 MG PO TABS: 4.0000 mg | ORAL_TABLET | Freq: Four times a day (QID) | ORAL | Status: DC | PRN

## 2021-07-28 NOTE — ED Notes (Signed)
Catheter changed, peri care provided. Brief soiled with urine, foul odor. Brief changed. Blanket provided.  Call bell in reach Stretcher locked in lowest position

## 2021-07-28 NOTE — H&P (Signed)
History and Physical    Adrienne Strickland VXB:[id number] DOB: 04/18/1961 DOA: 07/28/2021  PCP: Marygrace Drought, MD   Patient coming from: Home  I have personally briefly reviewed patient's old medical records in [place]  Chief Complaint: Fever  HPI: Adrienne Strickland is a 60 y.o. female with medical history significant for multiple sclerosis, neurogenic bladder status post chronic indwelling Foley catheter who presents to the emergency room via EMS for evaluation of fever and chills.  Patient states that she has not felt well for about 3 days and complains of weakness, increased fatigue, fever and chills. She was scheduled to have her Foley catheter changed on 07/26/21 but was unable to keep the appointment with the urologist because she did not feel well.  Due to worsening symptoms EMS was called and upon their arrival she was found to have a fever with a temp of 101.2 F.  She was also tachycardic with heart rate of 110. She complains of myalgias, back pain and poor oral intake but denies having any abdominal pain, no nausea, no vomiting, no changes in her bowel habits, no headache, no blurred vision, no difficulty swallowing, no focal deficits, no chest pain, shortness of breath, no dizziness or lightheadedness. Labs show sodium 139, potassium 3.4, chloride 106, bicarb 22, glucose 120, BUN 18, creatinine 1.07, calcium 8.2, alkaline phosphatase 95, albumin 3.7, AST 27, ALT 27, total protein 6.1, lactic acid 1.2, procalcitonin 3.7, white count 11.2, hemoglobin 12.4, hematocrit 36.5, MCV 89.7, RDW 13.3, platelet count 200, PT 16.1, INR 1.3 Respiratory viral panel is negative Urinalysis shows pyuria Chest x-ray reviewed by me shows clear lungs Twelve-lead EKG shows normal sinus rhythm   ED Course: Patient is a 60 year old female with a history of multiple sclerosis and neurogenic bladder.  She has a chronic indwelling Foley catheter in place and presents to the ER by EMS for evaluation of fever  and feeling unwell.  She has a urinary tract infection and received a dose of Rocephin in the ER. She met sepsis criteria as evidenced by fever with a T-max of 101.2 F, tachycardia, pyuria, leukocytosis and elevated procalcitonin levels. She will be admitted to the hospital for further evaluation    Review of Systems: As per HPI otherwise all other systems reviewed and negative.    Past Medical History:  Diagnosis Date   Abnormality of gait 09/26/2013   Arthritis    Duodenal ulcer 02/27/2015   Memory difficulty    Multiple sclerosis ([place]) 09/26/2013   Multiple sclerosis ([place])    Neurogenic bladder    self caths   Urinary frequency     Past Surgical History:  Procedure Laterality Date   BOTOX INJECTION N/A 12/13/2017   Procedure: BOTOX INJECTION;  Surgeon: Hollice Espy, MD;  Location: [facility] ORS;  Service: Urology;  Laterality: N/A;   CHOLECYSTECTOMY     COLONOSCOPY WITH PROPOFOL N/A 05/22/2018   Procedure: COLONOSCOPY WITH PROPOFOL;  Surgeon: Lollie Sails, MD;  Location: [facility] ENDOSCOPY;  Service: Endoscopy;  Laterality: N/A;   CYSTOSCOPY N/A 12/13/2017   Procedure: CYSTOSCOPY;  Surgeon: Hollice Espy, MD;  Location: [facility] ORS;  Service: Urology;  Laterality: N/A;   GALLBLADDER SURGERY       reports that she has never smoked. She has never used smokeless tobacco. She reports current alcohol use. She reports that she does not use drugs.  Allergies  Allergen Reactions   Sulfa Antibiotics Nausea And Vomiting   Tetanus Toxoids Swelling    Extreme swelling  at injection site    Family History  Problem Relation Age of Onset   Seizures Mother    Emphysema Father    Heart disease Brother    Kidney disease Neg Hx    Prostate cancer Neg Hx    Bladder Cancer Neg Hx       Prior to Admission medications   Medication Sig Start Date End Date Taking? Authorizing Provider  baclofen (LIORESAL) 10 MG tablet Take 10 mg by mouth 2 (two) times daily.    Yes [provider]  baclofen (LIORESAL) 10 MG tablet Take 1 tablet by mouth 2 (two) times daily. 07/06/21  Yes [provider]  Cholecalciferol (VITAMIN D-3) 1000 UNITS CAPS Take 1,000 Units by mouth daily.    Yes [provider]  dalfampridine 10 MG TB12 Take 10 mg by mouth 2 (two) times daily.   Yes [provider]  Dimethyl Fumarate 120 & 240 MG MISC Take 240 mg twice a day 07/13/21  Yes [provider]  fluticasone (FLONASE) 50 MCG/ACT nasal spray Place 2 sprays into both nostrils daily. 08/08/15  Yes Melynda Ripple, MD  memantine (NAMENDA) 10 MG tablet Take 1 tablet by mouth 2 (two) times daily. 07/19/21  Yes [provider]  oxycodone (OXY-IR) 5 MG capsule Take 5 mg by mouth every 6 (six) hours.   Yes [provider]  vitamin B-12 (CYANOCOBALAMIN) 1000 MCG tablet Take 1,000 mcg by mouth daily.   Yes [provider]  donepezil (ARICEPT) 10 MG tablet Take 10 mg by mouth at bedtime.  Patient not taking: Reported on 07/28/2021    [provider]    Physical Exam: Vitals:   07/28/21 1100 07/28/21 1200 07/28/21 1230 07/28/21 1314  BP: 111/64 (!) 102/55 (!) 119/55 129/66  Pulse: 72 71 63 72  Resp: (!) 22 (!) 25 16 20   Temp:    98.3 F (36.8 C)  TempSrc:    Oral  SpO2: 95% 94% 97% 99%  Weight:      Height:         Vitals:   07/28/21 1100 07/28/21 1200 07/28/21 1230 07/28/21 1314  BP: 111/64 (!) 102/55 (!) 119/55 129/66  Pulse: 72 71 63 72  Resp: (!) 22 (!) 25 16 20   Temp:    98.3 F (36.8 C)  TempSrc:    Oral  SpO2: 95% 94% 97% 99%  Weight:      Height:          Constitutional: Alert and oriented x 3 . Not in any apparent distress HEENT:      Head: Normocephalic and atraumatic.         Eyes: PERLA, EOMI, Conjunctivae are normal. Sclera is non-icteric.       Mouth/Throat: Mucous membranes are moist.       Neck: Supple with no signs of meningismus. Cardiovascular: Regular rate and rhythm. No murmurs, gallops, or rubs.  2+ symmetrical distal pulses are present . No JVD.  1+ LE edema Respiratory: Respiratory effort normal .Lungs sounds clear bilaterally. No wheezes, crackles, or rhonchi.  Gastrointestinal: Soft, non tender, and non distended with positive bowel sounds.  Genitourinary: No CVA tenderness. Musculoskeletal: Nontender with normal range of motion in all extremities. No cyanosis, or erythema of extremities. Neurologic:  Face is symmetric. Moving all extremities. No gross focal neurologic deficits .  Generalized weakness Skin: Skin is warm, dry.  No rash or ulcers Psychiatric: Mood and affect are normal    Labs on Admission:  I have personally reviewed following labs and imaging studies  CBC: Recent Labs  Lab 07/28/21 0916  WBC 11.2*  NEUTROABS 9.8*  HGB 12.4  HCT 36.5  MCV 89.0  PLT 258   Basic Metabolic Panel: Recent Labs  Lab 07/28/21 0916  NA 139  K 3.4*  CL 106  CO2 22  GLUCOSE 120*  BUN 18  CREATININE 1.07*  CALCIUM 8.2*   GFR: Estimated Creatinine Clearance: 65.5 mL/min (A) (by C-G formula based on SCr of 1.07 mg/dL (H)). Liver Function Tests: Recent Labs  Lab 07/28/21 0916  AST 27  ALT 27  ALKPHOS 95  BILITOT 1.1  PROT 6.1*  ALBUMIN 3.0*   No results for input(s): LIPASE, AMYLASE in the last 168 hours. No results for input(s): AMMONIA in the last 168 hours. Coagulation Profile: Recent Labs  Lab 07/28/21 0916  INR 1.3*   Cardiac Enzymes: No results for input(s): CKTOTAL, CKMB, CKMBINDEX, TROPONINI in the last 168 hours. BNP (last 3 results) No results for input(s): PROBNP in the last 8760 hours. HbA1C: No results for input(s): HGBA1C in the last 72 hours. CBG: No results for input(s): GLUCAP in the last 168 hours. Lipid Profile: No results for input(s): CHOL, HDL, LDLCALC, TRIG, CHOLHDL, LDLDIRECT in the last 72 hours. Thyroid Function Tests: No results for input(s): TSH, T4TOTAL, FREET4, T3FREE, THYROIDAB in the last 72 hours. Anemia Panel: No  results for input(s): VITAMINB12, FOLATE, FERRITIN, TIBC, IRON, RETICCTPCT in the last 72 hours. Urine analysis:    Component Value Date/Time   COLORURINE AMBER (A) 07/28/2021 0916   APPEARANCEUR CLOUDY (A) 07/28/2021 0916   APPEARANCEUR Cloudy (A) 06/30/2021 1020   LABSPEC 1.015 07/28/2021 0916   PHURINE 6.0 07/28/2021 0916   GLUCOSEU NEGATIVE 07/28/2021 0916   HGBUR SMALL (A) 07/28/2021 0916   BILIRUBINUR NEGATIVE 07/28/2021 0916   BILIRUBINUR Negative 06/30/2021 1020   KETONESUR NEGATIVE 07/28/2021 0916   PROTEINUR 100 (A) 07/28/2021 0916   NITRITE NEGATIVE 07/28/2021 0916   LEUKOCYTESUR LARGE (A) 07/28/2021 0916    Radiological Exams on Admission: DG Chest Port 1 View  Result Date: 07/28/2021 CLINICAL DATA:  Questionable sepsis EXAM: PORTABLE CHEST 1 VIEW COMPARISON:  None. FINDINGS: The heart size and mediastinal contours are within normal limits. Both lungs are clear. No acute pleural abnormality. IMPRESSION: Lungs are clear Electronically Signed   By: Yetta Glassman MD   On: 07/28/2021 10:38     Assessment/Plan Principal Problem:   Sepsis secondary to UTI [facility]) Active Problems:   Multiple sclerosis ([place])   Mild cognitive impairment      Sepsis secondary to UTI Evidence of sepsis includes fever with a T-max of 101.8F, tachycardia, pyuria, leukocytosis and elevated procalcitonin level Patient has neurogenic bladder from her underlying MS and has an indwelling Foley catheter which is changed monthly. Foley catheter was changed in the ER on 07/28/21 as patient missed her appointment with the urologist Aggressive IV fluid resuscitation Place patient empirically on Rocephin 1 g IV daily as prior urine culture yielded E. coli sensitive to cephalosporins Follow-up results of urine culture    History of multiple sclerosis Continue dalfampridine and Dimethyl Fumarate Follow-up with neurology as an outpatient   Mild cognitive impairment Continue Namenda    DVT  prophylaxis: Lovenox  Code Status: full code  Family Communication: Greater than 50% of time was spent discussing patient's condition and plan of care with her and her significant other at the bedside.  All questions and concerns have been addressed.  She verbalizes understanding and agrees to the plan. Disposition Plan: Back to previous home environment Consults called: none  Status: At the time of admission, it appears that the appropriate admission status for this patient is inpatient. This is judged to be reasonable and necessary in order to provide the required intensity of service to ensure the patient's safety given the presenting symptoms, physical exam findings, and initial radiographic and laboratory data in the context of their comorbid conditions. Patient requires inpatient status due to high intensity of service, high risk for further deterioration and high frequency of surveillance required.    Collier Bullock MD [facility]     07/28/2021, 1:51 PM

## 2021-07-28 NOTE — ED Notes (Signed)
[place] RN aware of assigned bed 

## 2021-07-28 NOTE — ED Provider Notes (Signed)
[facility] Emergency Department Provider Note ____________________________________________   Event Date/Time   First MD Initiated Contact with Patient 07/28/21 [phone]     (approximate)  I have reviewed the triage vital signs and the nursing notes.  HISTORY  Chief Complaint Code Sepsis   HPI CARESSA Adrienne Strickland is a 60 y.o. femalewho presents to the ED for evaluation of weakness and possible sepsis.   Chart review indicates hx MS. associated neurogenic bladder with indwelling Foley catheter in place.  Patient reports her Foley catheter is changed monthly, and was due for a change 2 days ago, but she could not go to the doctor's appointment for this a catheter change because she has been feeling so badly.    She reports feeling weak and tired, causing her to be totally immobile over the past 1 week.  She reports chills and shakes without documented fevers.  Denies pain, nausea, emesis, diarrhea, cough or shortness of breath.  Denies any falls or aggressive transfers.  Denies syncopal episodes.  Patient was febrile and tachycardic with EMS, receiving Tylenol and IV fluids prehospital.  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   Falls frequently 05/27/2020   Right leg numbness 05/19/2020   Right leg weakness 05/19/2020   Unsteadiness on feet 03/02/2020   Constipation 04/29/2016   Seasonal allergic rhinitis due to pollen 03/22/2016   Pain in both lower legs 01/14/2016    Urinary frequency 10/01/2015   Neurogenic bladder 10/01/2015   Chronic pain syndrome 06/18/2015   Duodenal ulcer with hemorrhage 04/24/2015   Pain, dental 04/24/2015   Status post tooth extraction 04/24/2015   Arthritis 01/20/2015   Extremity pain 05/20/2014   Multiple sclerosis (HCC) 09/26/2013   Abnormality of gait 09/26/2013   Dysuria 04/19/2013   Mild cognitive impairment 04/19/2013    Past Surgical History:  Procedure Laterality Date   BOTOX INJECTION N/A 12/13/2017   Procedure: BOTOX INJECTION;  Surgeon: Vanna Scotland, MD;  Location: [facility] ORS;  Service: Urology;  Laterality: N/A;   CHOLECYSTECTOMY     COLONOSCOPY WITH PROPOFOL N/A 05/22/2018   Procedure: COLONOSCOPY WITH PROPOFOL;  Surgeon: Christena Deem, MD;  Location: [facility] ENDOSCOPY;  Service: Endoscopy;  Laterality: N/A;   CYSTOSCOPY N/A 12/13/2017   Procedure: CYSTOSCOPY;  Surgeon: Vanna Scotland, MD;  Location: [facility] ORS;  Service: Urology;  Laterality: N/A;   GALLBLADDER SURGERY      Prior to Admission medications   Medication Sig Start Date End Date Taking? Authorizing Provider  baclofen (LIORESAL) 10 MG tablet Take 10 mg by mouth 2 (two) times daily.    Yes [provider]  baclofen (LIORESAL) 10 MG tablet Take 1 tablet by mouth 2 (two) times daily. 07/06/21  Yes [provider]  Cholecalciferol (VITAMIN D-3) 1000 UNITS CAPS Take 1,000 Units by mouth daily.    Yes [provider]  dalfampridine 10 MG TB12 Take 10 mg by mouth 2 (two) times daily.  Yes [provider]  Dimethyl Fumarate 120 & 240 MG MISC Take 240 mg twice a day 07/13/21  Yes [provider]  fluticasone (FLONASE) 50 MCG/ACT nasal spray Place 2 sprays into both nostrils daily. 08/08/15  Yes Domenick Gong, MD  memantine (NAMENDA) 10 MG tablet Take 1 tablet by mouth 2 (two) times daily. 07/19/21  Yes [provider]  oxycodone (OXY-IR) 5 MG capsule Take 5 mg by mouth every 6 (six) hours.   Yes  [provider]  vitamin B-12 (CYANOCOBALAMIN) 1000 MCG tablet Take 1,000 mcg by mouth daily.   Yes [provider]  donepezil (ARICEPT) 10 MG tablet Take 10 mg by mouth at bedtime.  Patient not taking: Reported on 07/28/2021    [provider]    Allergies Sulfa antibiotics and Tetanus toxoids  Family History  Problem Relation Age of Onset   Seizures Mother    Emphysema Father    Heart disease Brother    Kidney disease Neg Hx    Prostate cancer Neg Hx    Bladder Cancer Neg Hx     Social History Social History   Tobacco Use   Smoking status: Never   Smokeless tobacco: Never  Vaping Use   Vaping Use: Never used  Substance Use Topics   Alcohol use: Yes    Alcohol/week: 0.0 standard drinks    Comment: drinks everyday 2 beers   Drug use: No    Review of Systems  Constitutional: Positive for generalized weakness and subjective fever/chills Eyes: No visual changes. ENT: No sore throat. Cardiovascular: Denies chest pain. Respiratory: Denies shortness of breath. Gastrointestinal: No abdominal pain.  No nausea, no vomiting.  No diarrhea.  No constipation. Genitourinary: Negative for dysuria. Musculoskeletal: Negative for back pain. Skin: Negative for rash. Neurological: Negative for headaches, focal weakness or numbness.   ____________________________________________   PHYSICAL EXAM:  VITAL SIGNS: Vitals:   07/28/21 1230 07/28/21 1314  BP: (!) 119/55 129/66  Pulse: 63 72  Resp: 16 20  Temp:  98.3 F (36.8 C)  SpO2: 97% 99%    Constitutional: Alert and oriented.  Somewhat frazzled.  No distress.  Conversational.  Chronically ill-appearing. Eyes: Conjunctivae are normal. PERRL. EOMI. Head: Atraumatic. Nose: No congestion/rhinnorhea. Mouth/Throat: Mucous membranes are dry.  Oropharynx non-erythematous. Neck: No stridor. No cervical spine tenderness to palpation. Cardiovascular: Tachycardic rate, regular rhythm. Grossly normal heart  sounds.  Good peripheral circulation. Respiratory: Normal respiratory effort.  No retractions. Lungs CTAB. Gastrointestinal: Soft , nondistended, nontender to palpation. No CVA tenderness. Indwelling Foley catheter with cloudy tube and an empty bag. Musculoskeletal: No lower extremity tenderness nor edema.  No joint effusions. No signs of acute trauma. Neurologic:  Normal speech and language. No gross focal neurologic deficits are appreciated. Skin:  Skin is warm, dry and intact. No rash noted. Psychiatric: Mood and affect are normal. Speech and behavior are normal. ____________________________________________   LABS (all labs ordered are listed, but only abnormal results are displayed)  Labs Reviewed  COMPREHENSIVE METABOLIC PANEL - Abnormal; Notable for the following components:      Result Value   Potassium 3.4 (*)    Glucose, Bld 120 (*)    Creatinine, Ser 1.07 (*)    Calcium 8.2 (*)    Total Protein 6.1 (*)    Albumin 3.0 (*)    All other components within normal limits  CBC WITH DIFFERENTIAL/PLATELET - Abnormal; Notable for the following components:   WBC 11.2 (*)    Neutro Abs 9.8 (*)  Lymphs Abs 0.5 (*)    Abs Immature Granulocytes 0.08 (*)    All other components within normal limits  PROTIME-INR - Abnormal; Notable for the following components:   Prothrombin Time 16.1 (*)    INR 1.3 (*)    All other components within normal limits  APTT - Abnormal; Notable for the following components:   aPTT 41 (*)    All other components within normal limits  URINALYSIS, COMPLETE (UACMP) WITH MICROSCOPIC - Abnormal; Notable for the following components:   Color, Urine AMBER (*)    APPearance CLOUDY (*)    Hgb urine dipstick SMALL (*)    Protein, ur 100 (*)    Leukocytes,Ua LARGE (*)    WBC, UA >50 (*)    Bacteria, UA MANY (*)    All other components within normal limits  RESP PANEL BY RT-PCR (FLU A&B, COVID) ARPGX2  CULTURE, BLOOD (SINGLE)  URINE CULTURE  CULTURE, BLOOD  (SINGLE)  LACTIC ACID, PLASMA  PREGNANCY, URINE  PROCALCITONIN   ____________________________________________  12 Lead EKG  Sinus rhythm, rate 97 bpm.  Normal axis and intervals.  No evidence of acute ischemia. ____________________________________________  RADIOLOGY  ED MD interpretation: 1 view CXR reviewed by me without evidence of acute cardiopulmonary pathology.  Official radiology report(s): DG Chest Port 1 View  Result Date: 07/28/2021 CLINICAL DATA:  Questionable sepsis EXAM: PORTABLE CHEST 1 VIEW COMPARISON:  None. FINDINGS: The heart size and mediastinal contours are within normal limits. Both lungs are clear. No acute pleural abnormality. IMPRESSION: Lungs are clear Electronically Signed   By: Allegra Lai MD   On: 07/28/2021 10:38    ____________________________________________   PROCEDURES and INTERVENTIONS  Procedure(s) performed (including Critical Care):  .1-3 Lead EKG Interpretation  Date/Time: 07/28/2021 1:55 PM Performed by: Delton Prairie, MD Authorized by: Delton Prairie, MD     Interpretation: abnormal     ECG rate:  101   ECG rate assessment: tachycardic     Rhythm: sinus tachycardia     Ectopy: none     Conduction: normal   .Critical Care  Date/Time: 07/28/2021 1:55 PM Performed by: Delton Prairie, MD Authorized by: Delton Prairie, MD   Critical care provider statement:    Critical care time (minutes):  45   Critical care was necessary to treat or prevent imminent or life-threatening deterioration of the following conditions:  Sepsis   Critical care was time spent personally by me on the following activities:  Discussions with consultants, evaluation of patient's response to treatment, examination of patient, ordering and performing treatments and interventions, ordering and review of laboratory studies, ordering and review of radiographic studies, pulse oximetry, re-evaluation of patient's condition, obtaining history from patient or surrogate and  review of old charts  Medications  lactated ringers bolus 1,000 mL (0 mLs Intravenous Stopped 07/28/21 1023)  cefTRIAXone (ROCEPHIN) 1 g in sodium chloride 0.9 % 100 mL IVPB (0 g Intravenous Stopped 07/28/21 1141)    ____________________________________________   MDM / ED COURSE   60 year old woman with history of MS and indwelling Foley catheter presents to the ED with evidence of sepsis due to UTI requiring medical admission.  Tachycardic and febrile with EMS, improving here after fluids and Tylenol.  Remains hemodynamically stable without signs of shock or hypoxia.  Blood work with mild leukocytosis, but no lactic acidosis.  CXR is clear without evidence of CAP.  Procalcitonin is quite elevated.  Replaced Foley catheter with And urinalysis from this demonstrates stigmata of UTI.  We will  cover with Rocephin and discussed the case with hospitalist for admission.  Clinical Course as of 07/28/21 1356  Wed Jul 28, 2021  0919 Discussed plan of care with patient and nursing staff, to include replacement of Foley catheter and urinalysis from fresh tube.  Discussed the possibility of medical admission.  All are in agreement. [DS]  1119 I discuss with Dr. Joylene Igo, who agrees to admit [DS]    Clinical Course User Index [DS] Delton Prairie, MD    ____________________________________________   FINAL CLINICAL IMPRESSION(S) / ED DIAGNOSES  Final diagnoses:  Sepsis without acute organ dysfunction, due to unspecified organism [facility])     ED Discharge Orders     None        Rodriques Badie   Note:  This document was prepared using Dragon voice recognition software and may include unintentional dictation errors.    Delton Prairie, MD 07/28/21 1357

## 2021-07-28 NOTE — ED Triage Notes (Signed)
Pt to ED [facility] from home sepsis alert.  Supposed to have catheter changed Monday, did not d/t weakness from MS and unable to get to appt. EMS reports20g to right thumb, 20g left wrist, NS gave PTA, 975mg  tylenol given PTA. 101.2 temp, ST 110 PTA Cath bag empty on arrival  Pt alert and oriented, NAD noted

## 2021-07-29 ENCOUNTER — Ambulatory Visit: Payer: Self-pay | Admitting: Physician Assistant

## 2021-07-29 DIAGNOSIS — N390 Urinary tract infection, site not specified: Secondary | ICD-10-CM | POA: Diagnosis not present

## 2021-07-29 DIAGNOSIS — A419 Sepsis, unspecified organism: Secondary | ICD-10-CM

## 2021-07-29 LAB — CBC
HCT: 37.8 % (ref 36.0–46.0)
Hemoglobin: 12.6 g/dL (ref 12.0–15.0)
MCH: 29.9 pg (ref 26.0–34.0)
MCHC: 33.3 g/dL (ref 30.0–36.0)
MCV: 89.8 fL (ref 80.0–100.0)
Platelets: 197 10*3/uL (ref 150–400)
RBC: 4.21 MIL/uL (ref 3.87–5.11)
RDW: 13 % (ref 11.5–15.5)
WBC: 8.9 10*3/uL (ref 4.0–10.5)
nRBC: 0 % (ref 0.0–0.2)

## 2021-07-29 LAB — BLOOD CULTURE ID PANEL (REFLEXED) - BCID2

## 2021-07-29 LAB — BASIC METABOLIC PANEL
Anion gap: 7 (ref 5–15)
BUN: 13 mg/dL (ref 6–20)
CO2: 26 mmol/L (ref 22–32)
Calcium: 8.6 mg/dL — ABNORMAL LOW (ref 8.9–10.3)
Chloride: 107 mmol/L (ref 98–111)
Creatinine, Ser: 0.96 mg/dL (ref 0.44–1.00)
GFR, Estimated: >60 mL/min (ref >60)
Glucose, Bld: 107 mg/dL — ABNORMAL HIGH (ref 70–99)
Potassium: 4.4 mmol/L (ref 3.5–5.1)
Sodium: 140 mmol/L (ref 135–145)

## 2021-07-29 LAB — HIV ANTIBODY (ROUTINE TESTING W REFLEX): HIV Screen 4th Generation wRfx: NONREACTIVE

## 2021-07-29 LAB — PROCALCITONIN: Procalcitonin: 3.18 ng/mL

## 2021-07-29 MED ORDER — SODIUM CHLORIDE 0.9 % IV SOLN
2.0000 g | INTRAVENOUS | Status: DC
  Administered 2021-07-29 – 2021-07-31 (×3): 2 g via INTRAVENOUS
  Filled 2021-07-29 (×3): qty 20
  Filled 2021-07-29: qty 2
  Filled 2021-07-29: qty 20

## 2021-07-29 NOTE — TOC Initial Note (Signed)
Transition of Care [facility]) - Initial/Assessment Note    Patient Details  Name: Adrienne Strickland MRN: [id number] Date of Birth: 25-Mar-1961  [facility]) CM/SW Contact:    Adrienne Hutching, RN Phone Number: 07/29/2021, 2:27 PM  Clinical Narrative:                 Patient admitted to the hospital with sepsis from UTI.  Patient has an indwelling catheter that was put in outpatient.  Patient was not feeling well and missed her follow up urology appointment Monday.  RNCM met with patient at the bedside.  Patient very pleasant and engaged.  Patient reports that she lives in [place] with her significant other Adrienne Strickland and they have been together for 20 years.  Patient has a rollator and says that she can get back and forth to the bathroom, she does not drive but Adrienne Strickland provides transportation.   Patient would like home health services at discharge, she does not have an agency preference.  Referral given to [facility] with [facility] for RN, PT, OT.   Patient hopes to go home tomorrow.   Expected Discharge Plan: [place] Barriers to Discharge: Continued Medical Work up   Patient Goals and CMS Choice Patient states their goals for this hospitalization and ongoing recovery are:: Patient hopes to go home tomorrow CMS Medicare.gov Compare Post Acute Care list provided to:: Patient Choice offered to / list presented to : Patient  Expected Discharge Plan and Services Expected Discharge Plan: [place]   Discharge Planning Services: CM Consult Post Acute Care Choice: [place] arrangements for the past 2 months: Apartment                 DME Arranged: N/A DME Agency: NA       HH Arranged: RN, PT, OT [place] Agency: [place] ([facility]) Date HH Agency Contacted: 07/29/21 Time [place]: 1426 Representative spoke with at [place]: Adrienne Strickland  Prior Living Arrangements/Services Living arrangements for the past 2 months: Apartment Lives  with:: Significant Other Patient language and need for interpreter reviewed:: Yes Do you feel safe going back to the place where you live?: Yes      Need for Family Participation in Patient Care: Yes (Comment) (MS) Care giver support system in place?: Yes (comment) Adrienne Strickland) Current home services: DME (rollator and hand rails and grab bars) Criminal Activity/Legal Involvement Pertinent to Current Situation/Hospitalization: No - Comment as needed  Activities of Daily Living Home Assistive Devices/Equipment: Built-in shower seat, Eyeglasses, Grab bars around toilet, Walker (specify type) ADL Screening (condition at time of admission) Patient's cognitive ability adequate to safely complete daily activities?: Yes Is the patient deaf or have difficulty hearing?: No Does the patient have difficulty seeing, even when wearing glasses/contacts?: No Does the patient have difficulty concentrating, remembering, or making decisions?: No Patient able to express need for assistance with ADLs?: Yes Does the patient have difficulty dressing or bathing?: Yes Independently performs ADLs?: No Communication: Independent Dressing (OT): Needs assistance Is this a change from baseline?: Pre-admission baseline Grooming: Needs assistance Is this a change from baseline?: Pre-admission baseline Feeding: Needs assistance Is this a change from baseline?: Pre-admission baseline Bathing: Needs assistance Is this a change from baseline?: Pre-admission baseline Toileting: Needs assistance Is this a change from baseline?: Pre-admission baseline In/Out Bed: Needs assistance Is this a change from baseline?: Pre-admission baseline Walks in Home: Independent with device (comment) Does the patient have difficulty walking  or climbing stairs?: Yes Weakness of Legs: Both Weakness of Arms/Hands: Both  Permission Sought/Granted Permission sought to share information with : Case Manager, Family Supports, Other  (comment) Permission granted to share information with : Yes, Verbal Permission Granted  Share Information with NAME: Adrienne Strickland  Permission granted to share info w AGENCY: [place] granted to share info w Relationship: significant other     Emotional Assessment Appearance:: Appears stated age Attitude/Demeanor/Rapport: Engaged, Gracious Affect (typically observed): Accepting Orientation: : Oriented to Self, Oriented to Place, Oriented to  Time, Oriented to Situation Alcohol / Substance Use: Not Applicable Psych Involvement: No (comment)  Admission diagnosis:  Sepsis secondary to UTI ([place]) [A41.9, N39.0] Patient Active Problem List   Diagnosis Date Noted   Sepsis secondary to UTI ([place]) 07/28/2021   Lumbar radiculopathy 06/25/2020   Right leg pain 06/25/2020   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   Falls frequently 05/27/2020   Right leg numbness 05/19/2020   Right leg weakness 05/19/2020   Unsteadiness on feet 03/02/2020   Constipation 04/29/2016   Seasonal allergic rhinitis due to pollen 03/22/2016   Pain in both lower legs 01/14/2016   Urinary frequency 10/01/2015   Neurogenic bladder 10/01/2015   Chronic pain syndrome 06/18/2015   Duodenal ulcer with hemorrhage 04/24/2015   Pain, dental 04/24/2015   Status post tooth extraction 04/24/2015   Arthritis 01/20/2015   Extremity pain 05/20/2014   Multiple sclerosis ([place]) 09/26/2013   Abnormality of gait 09/26/2013   Dysuria 04/19/2013   Mild cognitive impairment 04/19/2013   PCP:  Marygrace Drought, MD Pharmacy:   [id number] [place] [address]Phone: [phone]: [phone]    Social Determinants of Health (SDOH) Interventions    Readmission Risk Interventions No flowsheet data found.

## 2021-07-29 NOTE — Progress Notes (Signed)
PHARMACY - PHYSICIAN COMMUNICATION CRITICAL VALUE ALERT - BLOOD CULTURE IDENTIFICATION (BCID)  Adrienne Strickland is an 60 y.o. female who presented to [facility] on 07/28/2021 with a chief complaint of sepsis, UTI.   Assessment:  E coli in 1 of 4 bottles, no resistance detected.  (include suspected source if known)  Name of physician (or Provider) Contacted:  Doylene Canard   Current antibiotics: Ceftriaxone 1 gm IV Q24H  Changes to prescribed antibiotics recommended:  Will increase dose to ceftriaxone 2 gm IV Q24H .   No results found for this or any previous visit.  Lyriq Finerty D 07/29/2021  1:27 AM

## 2021-07-29 NOTE — Progress Notes (Signed)
[facility] PROGRESS NOTE    Adrienne Strickland  MHD:[id number] DOB: 1961-08-21 DOA: 07/28/2021 PCP: Sharilyn Sites, MD      Brief Narrative:  Adrienne Strickland is a 60 y.o. F with MS, neurogenic bladder with indwelling Foley for the last 10 years who presented with fever and chills for 3 days.  Over the last several days, the patient has had decreased appetite, increased fatigue, chills, finally fever and severe malaise so she came to the ER.  In the ER she was febrile to 101, heart rate 110, WBC 11.2K, COVID-negative.  She had removed her Foley at home, and a new Foley was placed and clean-catch urine showed pyuria.  Chest x-ray clear.       Assessment & Plan:  Sepsis due to indwelling Foley associated UTI due to E. coli Patient presented with tachycardia, fever in the setting of UTI.  Later found to have E. coli bacteremia. - Continue Rocephin - Follow blood cultures and sensitivities  Multiple sclerosis -Continue baclofen and memantine  Hypokalemia Supplemented and resolved         Disposition: Status is: Inpatient  Remains inpatient appropriate because:Inpatient level of care appropriate due to severity of illness  Dispo: The patient is from: Home              Anticipated d/c is to: Home              Patient currently is not medically stable to d/c.   Difficult to place patient No       Level of care: Med-Surg       MDM: The below labs and imaging reports were reviewed and summarized above.  Medication management as above.     DVT prophylaxis: Lovenox  Code Status: FULL Family Communication: significant other by phone     Antimicrobials:  8/3 Rocephin>>  Culture data:  8/3 urine culture: E. coli 8/3 blood culture: 1 out of 2 with gram-negative rods          Subjective: Patient's appetite is better.  No further fever.  No confusion, chest pain, flank pain, hematuria.  Objective: Vitals:   07/29/21 0443 07/29/21  0808 07/29/21 1158 07/29/21 1607  BP: 130/66 134/66 130/67 135/67  Pulse: 82 79 70 80  Resp: 18 14 16 18   Temp: 100.1 F (37.8 C) 98.8 F (37.1 C) 98.6 F (37 C) 98.2 F (36.8 C)  TempSrc:      SpO2: 100% 100% 99% 98%  Weight:      Height:        Intake/Output Summary (Last 24 hours) at 07/29/2021 1902 Last data filed at 07/29/2021 1700 Gross per 24 hour  Intake 1624.82 ml  Output 3850 ml  Net -2225.18 ml   Filed Weights   07/28/21 0912  Weight: 92.1 kg    Examination: General appearance:  adult female, alert and in no acute distress.  Lying bed, appears tired HEENT: Anicteric, conjunctiva pink, lids and lashes normal. No nasal deformity, discharge, epistaxis.  Lips moist, dentition in poor repair, oropharynx tacky dry, no oral lesions.   Skin: Warm and dry.  no jaundice.  No suspicious rashes or lesions. Cardiac: RRR, nl S1-S2, no murmurs appreciated.  Capillary refill is brisk.  JVP not visible.  No LE edema.  Radial pulses 2+ and symmetric. Respiratory: Normal respiratory rate and rhythm.  CTAB without rales or wheezes. Abdomen: Abdomen soft.  no TTP or guarding, no CVA tenderness. No ascites, distension, hepatosplenomegaly.   MSK:  No deformities or effusions. Neuro: Awake and alert.  EOMI, moves all extremities with some spasticity, but symmetric strength. Speech altering, but at baseline.    Psych: Sensorium intact and responding to questions, attention normal. Affect pleasant.  Judgment and insight appear normal.    Data Reviewed: I have personally reviewed following labs and imaging studies:  CBC: Recent Labs  Lab 07/28/21 0916 07/29/21 0425  WBC 11.2* 8.9  NEUTROABS 9.8*  --   HGB 12.4 12.6  HCT 36.5 37.8  MCV 89.0 89.8  PLT 200 197   Basic Metabolic Panel: Recent Labs  Lab 07/28/21 0916 07/29/21 0425  NA 139 140  K 3.4* 4.4  CL 106 107  CO2 22 26  GLUCOSE 120* 107*  BUN 18 13  CREATININE 1.07* 0.96  CALCIUM 8.2* 8.6*   GFR: Estimated  Creatinine Clearance: 73 mL/min (by C-G formula based on SCr of 0.96 mg/dL). Liver Function Tests: Recent Labs  Lab 07/28/21 0916  AST 27  ALT 27  ALKPHOS 95  BILITOT 1.1  PROT 6.1*  ALBUMIN 3.0*   No results for input(s): LIPASE, AMYLASE in the last 168 hours. No results for input(s): AMMONIA in the last 168 hours. Coagulation Profile: Recent Labs  Lab 07/28/21 0916  INR 1.3*   Cardiac Enzymes: No results for input(s): CKTOTAL, CKMB, CKMBINDEX, TROPONINI in the last 168 hours. BNP (last 3 results) No results for input(s): PROBNP in the last 8760 hours. HbA1C: No results for input(s): HGBA1C in the last 72 hours. CBG: No results for input(s): GLUCAP in the last 168 hours. Lipid Profile: No results for input(s): CHOL, HDL, LDLCALC, TRIG, CHOLHDL, LDLDIRECT in the last 72 hours. Thyroid Function Tests: No results for input(s): TSH, T4TOTAL, FREET4, T3FREE, THYROIDAB in the last 72 hours. Anemia Panel: No results for input(s): VITAMINB12, FOLATE, FERRITIN, TIBC, IRON, RETICCTPCT in the last 72 hours. Urine analysis:    Component Value Date/Time   COLORURINE AMBER (A) 07/28/2021 0916   APPEARANCEUR CLOUDY (A) 07/28/2021 0916   APPEARANCEUR Cloudy (A) 06/30/2021 1020   LABSPEC 1.015 07/28/2021 0916   PHURINE 6.0 07/28/2021 0916   GLUCOSEU NEGATIVE 07/28/2021 0916   HGBUR SMALL (A) 07/28/2021 0916   BILIRUBINUR NEGATIVE 07/28/2021 0916   BILIRUBINUR Negative 06/30/2021 1020   KETONESUR NEGATIVE 07/28/2021 0916   PROTEINUR 100 (A) 07/28/2021 0916   NITRITE NEGATIVE 07/28/2021 0916   LEUKOCYTESUR LARGE (A) 07/28/2021 0916   Sepsis Labs: @LABRCNTIP (procalcitonin:4,lacticacidven:4)  ) Recent Results (from the past 240 hour(s))  Blood culture (routine single)     Status: None (Preliminary result)   Collection Time: 07/28/21  9:16 AM   Specimen: BLOOD  Result Value Ref Range Status   Specimen Description BLOOD LEFT ANTECUBITAL  Final   Special Requests   Final     BOTTLES DRAWN AEROBIC AND ANAEROBIC Blood Culture adequate volume   Culture  Setup Time   Final    GRAM NEGATIVE RODS ANAEROBIC BOTTLE ONLY Organism ID to follow CRITICAL RESULT CALLED TO, READ BACK BY AND VERIFIED WITH: JASON ROBBINS@0017  07/29/21 RH Performed at [facility], [address] [place], [place]    Culture GRAM NEGATIVE RODS  Final   Report Status PENDING  Incomplete  Urine Culture     Status: Abnormal (Preliminary result)   Collection Time: 07/28/21  9:16 AM   Specimen: Urine, Catheterized  Result Value Ref Range Status   Specimen Description   Final    URINE, CATHETERIZED Performed at [facility], [address], [place], [place] [address]    Special Requests   Final    NONE Performed at [facility], [address] [place], [place] [address]    Culture (A)  Final    >=100,000 COLONIES/mL ESCHERICHIA COLI SUSCEPTIBILITIES TO FOLLOW Performed at [facility], [address] [place], [place] [address]    Report Status PENDING  Incomplete  Blood culture (single)     Status: None (Preliminary result)   Collection Time: 07/28/21  9:16 AM   Specimen: BLOOD  Result Value Ref Range Status   Specimen Description BLOOD BLOOD LEFT WRIST  Final   Special Requests   Final    BOTTLES DRAWN AEROBIC AND ANAEROBIC Blood Culture adequate volume   Culture   Final    NO GROWTH < 24 HOURS Performed at [facility], [address], [place], [place] [address]    Report Status PENDING  Incomplete  Resp Panel by RT-PCR (Flu A&B, Covid) Nasopharyngeal Swab     Status: None   Collection Time: 07/28/21  9:16 AM   Specimen: Nasopharyngeal Swab; Nasopharyngeal(NP) swabs in vial transport medium  Result Value Ref Range Status   SARS Coronavirus 2 by RT PCR NEGATIVE NEGATIVE Final    Comment: (NOTE) SARS-CoV-2 target nucleic acids are NOT DETECTED.  The SARS-CoV-2 RNA is generally detectable in upper respiratory specimens  during the acute phase of infection. The lowest concentration of SARS-CoV-2 viral copies this assay can detect is 138 copies/mL. A negative result does not preclude SARS-Cov-2 infection and should not be used as the sole basis for treatment or other patient management decisions. A negative result may occur with  improper specimen collection/handling, submission of specimen other than nasopharyngeal swab, presence of viral mutation(s) within the areas targeted by this assay, and inadequate number of viral copies(<138 copies/mL). A negative result must be combined with clinical observations, patient history, and epidemiological information. The expected result is Negative.  Fact Sheet for Patients:  BloggerCourse.com  Fact Sheet for Healthcare Providers:  SeriousBroker.it  This test is no t yet approved or cleared by the [place] FDA and  has been authorized for detection and/or diagnosis of SARS-CoV-2 by FDA under an Emergency Use Authorization (EUA). This EUA will remain  in effect (meaning this test can be used) for the duration of the COVID-19 declaration under Section 564(b)(1) of the Act, 21 U.S.C.section 360bbb-3(b)(1), unless the authorization is terminated  or revoked sooner.       Influenza A by PCR NEGATIVE NEGATIVE Final   Influenza B by PCR NEGATIVE NEGATIVE Final    Comment: (NOTE) The Xpert Xpress SARS-CoV-2/FLU/RSV plus assay is intended as an aid in the diagnosis of influenza from Nasopharyngeal swab specimens and should not be used as a sole basis for treatment. Nasal washings and aspirates are unacceptable for Xpert Xpress SARS-CoV-2/FLU/RSV testing.  Fact Sheet for Patients: BloggerCourse.com  Fact Sheet for Healthcare Providers: SeriousBroker.it  This test is not yet approved or cleared by the [place] FDA and has been authorized for detection  and/or diagnosis of SARS-CoV-2 by FDA under an Emergency Use Authorization (EUA). This EUA will remain in effect (meaning this test can be used) for the duration of the COVID-19 declaration under Section 564(b)(1) of the Act, 21 U.S.C. section 360bbb-3(b)(1), unless the authorization is terminated or revoked.  Performed at [facility], [address], [place], [place] [address]   Blood Culture ID Panel (Reflexed)     Status: Abnormal  Collection Time: 07/28/21  9:16 AM  Result Value Ref Range Status   Enterococcus faecalis NOT DETECTED NOT DETECTED Final   Enterococcus Faecium NOT DETECTED NOT DETECTED Final   Listeria monocytogenes NOT DETECTED NOT DETECTED Final   Staphylococcus species NOT DETECTED NOT DETECTED Final   Staphylococcus aureus (BCID) NOT DETECTED NOT DETECTED Final   Staphylococcus epidermidis NOT DETECTED NOT DETECTED Final   Staphylococcus lugdunensis NOT DETECTED NOT DETECTED Final   Streptococcus species NOT DETECTED NOT DETECTED Final   Streptococcus agalactiae NOT DETECTED NOT DETECTED Final   Streptococcus pneumoniae NOT DETECTED NOT DETECTED Final   Streptococcus pyogenes NOT DETECTED NOT DETECTED Final   A.calcoaceticus-baumannii NOT DETECTED NOT DETECTED Final   Bacteroides fragilis NOT DETECTED NOT DETECTED Final   Enterobacterales DETECTED (A) NOT DETECTED Final    Comment: Enterobacterales represent a large order of gram negative bacteria, not a single organism. CRITICAL RESULT CALLED TO, READ BACK BY AND VERIFIED WITH: JASON ROBBINS@0017  07/29/21 RH    Enterobacter cloacae complex NOT DETECTED NOT DETECTED Final   Escherichia coli DETECTED (A) NOT DETECTED Final    Comment: CRITICAL RESULT CALLED TO, READ BACK BY AND VERIFIED WITH: JASON ROBBINS@0017  07/29/21 RH    Klebsiella aerogenes NOT DETECTED NOT DETECTED Final   Klebsiella oxytoca NOT DETECTED NOT DETECTED Final   Klebsiella pneumoniae NOT DETECTED NOT DETECTED Final   Proteus  species NOT DETECTED NOT DETECTED Final   Salmonella species NOT DETECTED NOT DETECTED Final   Serratia marcescens NOT DETECTED NOT DETECTED Final   Haemophilus influenzae NOT DETECTED NOT DETECTED Final   Neisseria meningitidis NOT DETECTED NOT DETECTED Final   Pseudomonas aeruginosa NOT DETECTED NOT DETECTED Final   Stenotrophomonas maltophilia NOT DETECTED NOT DETECTED Final   Candida albicans NOT DETECTED NOT DETECTED Final   Candida auris NOT DETECTED NOT DETECTED Final   Candida glabrata NOT DETECTED NOT DETECTED Final   Candida krusei NOT DETECTED NOT DETECTED Final   Candida parapsilosis NOT DETECTED NOT DETECTED Final   Candida tropicalis NOT DETECTED NOT DETECTED Final   Cryptococcus neoformans/gattii NOT DETECTED NOT DETECTED Final   CTX-M ESBL NOT DETECTED NOT DETECTED Final   Carbapenem resistance IMP NOT DETECTED NOT DETECTED Final   Carbapenem resistance KPC NOT DETECTED NOT DETECTED Final   Carbapenem resistance NDM NOT DETECTED NOT DETECTED Final   Carbapenem resist OXA 48 LIKE NOT DETECTED NOT DETECTED Final   Carbapenem resistance VIM NOT DETECTED NOT DETECTED Final    Comment: Performed at [facility], [address] [place], [place] [address]         Radiology Studies: DG Chest Port 1 View  Result Date: 07/28/2021 CLINICAL DATA:  Questionable sepsis EXAM: PORTABLE CHEST 1 VIEW COMPARISON:  None. FINDINGS: The heart size and mediastinal contours are within normal limits. Both lungs are clear. No acute pleural abnormality. IMPRESSION: Lungs are clear Electronically Signed   By: Allegra LaiLeah  Strickland MD   On: 07/28/2021 10:38        Scheduled Meds:  baclofen  10 mg Oral BID   Chlorhexidine Gluconate Cloth  6 each Topical Daily   cholecalciferol  1,000 Units Oral Daily   enoxaparin (LOVENOX) injection  0.5 mg/kg Subcutaneous Q24H   fluticasone  2 spray Each Nare Daily   memantine  10 mg Oral BID   oxyCODONE  5 mg Oral Q6H   vitamin B-12  1,000 mcg  Oral Daily   Continuous Infusions:  cefTRIAXone (ROCEPHIN)  IV Stopped (07/29/21 0317)  LOS: 1 day    Time spent: 25 minutes    Alberteen Sam, MD [facility] 07/29/2021, 7:02 PM     Please page though AMION or Epic secure chat:  For [employer], Higher education careers adviser

## 2021-07-29 NOTE — Care Management Important Message (Signed)
Important Message  Patient Details  Name: Adrienne Strickland MRN: [id number] Date of Birth: 1961/04/27   Medicare Important Message Given:  N/A - LOS <3 / Initial given by admissions  Initial Medicare IM reviewed with patient by Caprice Renshaw, Patient Access Associate on 07/29/2021 at 11:38am.     Johnell Comings 07/29/2021, 7:26 PM

## 2021-07-29 NOTE — Evaluation (Signed)
Physical Therapy Evaluation Patient Details Name: Adrienne Strickland MRN: [id number] DOB: 1961-11-08 Today's Date: 07/29/2021   History of Present Illness  Pt is a 60 y.o. F arriving to ED after sustaining a fall at home due to LE weakness along with fever and chills. Pt admitted for sepsis due to UTI. PMH includes multiple sclerosis with neurogenic bladder s/p chronic indwelling foley catheter  Clinical Impression  Pt alert, Aox4, conversant, cooperative. Pt states PLOF includes household ambulation with RW and ability to climb 3 stairs with RW with supervision. Pt lives with partner who works during the day. During these times, pt is left food and water and does not ambulate without someone being present. Pt also states her partner performs most ADLs, IADLs.   Pt required min-A for bed mobility for trunk and scooting assist. Pt was able to transfer and ambulate with min-guard and RW. Pt is slightly impulsive with mobility and requiring cues for redirection and attention. Pt ambulated 50 ft with one rest break, but states she is not at baseline functioning due to fatigue. Pending conversation with pt's partner for level of available assistance if needed, discharge recommendations are HHPT. Skilled PT intervention is indicated to address deficits in function, mobility, and to return to PLOF as able.      Follow Up Recommendations Home health PT;Supervision for mobility/OOB    Equipment Recommendations  3in1 (PT)    Recommendations for Other Services OT consult     Precautions / Restrictions Precautions Precautions: Fall Restrictions Weight Bearing Restrictions: No      Mobility  Bed Mobility Overal bed mobility: Needs Assistance Bed Mobility: Supine to Sit     Supine to sit: Min assist     General bed mobility comments: min-A for trunk and scooting toward EOB    Transfers Overall transfer level: Needs assistance Equipment used: Rolling walker (2 wheeled) Transfers: Sit to/from  Stand Sit to Stand: Min guard         General transfer comment: x2 sit > stand with multi-modal cues for technique  Ambulation/Gait Ambulation/Gait assistance: Min guard Gait Distance (Feet): 50 Feet Assistive device: Rolling walker (2 wheeled) Gait Pattern/deviations: Step-to pattern     General Gait Details: min-gaurd for safety; pt required 1 rest break due to fatigue (HR: 115, SpO2: 98%)  Stairs            Wheelchair Mobility    Modified Rankin (Stroke Patients Only)       Balance Overall balance assessment: Needs assistance Sitting-balance support: Feet supported;Bilateral upper extremity supported Sitting balance-Leahy Scale: Fair Sitting balance - Comments: Posterior lean, requires BUE for safety Postural control: Posterior lean Standing balance support: Bilateral upper extremity supported;During functional activity Standing balance-Leahy Scale: Poor Standing balance comment: requires BUE support                             Pertinent Vitals/Pain Pain Assessment: Faces Faces Pain Scale: Hurts a little bit Pain Location: Bilateral knees during MMT Pain Descriptors / Indicators: Grimacing    Home Living Family/patient expects to be discharged to:: Private residence Living Arrangements: Spouse/significant other Available Help at Discharge: Family (spouse) Type of Home: Apartment Home Access: Stairs to enter Entrance Stairs-Rails: None (pt uncertain if rails are present) Entrance Stairs-Number of Steps: 3 Home Layout: One level Home Equipment: Walker - 2 wheels;Shower seat;Grab bars - toilet;Grab bars - tub/shower;Other (comment) (Per chart review, pt is awaiting WC)  Prior Function Level of Independence: Needs assistance   Gait / Transfers Assistance Needed: Pt ambulates household distances with RW. Pt states during the day, she does not ambulate throughout apartment unless her partner is available. Pt is able to climb 3 steps with RW  without physical assistance from partner.  ADL's / Homemaking Assistance Needed: Pt requires assistance for getting into tub,shower. Pt is able to sit and bath without assistance. Pt requires assistance for all other ADLs including getting to and from bathroom for BM.        Hand Dominance   Dominant Hand: Left    Extremity/Trunk Assessment   Upper Extremity Assessment Upper Extremity Assessment: Overall WFL for tasks assessed    Lower Extremity Assessment Lower Extremity Assessment: RLE deficits/detail;LLE deficits/detail RLE Deficits / Details: MMT 4/5 knee extension, knee flexion; 5/5 dorsiflexion; reflexes 4+, clonus RLE Sensation: WNL (Pt notes n/t intermittently BLE) LLE Deficits / Details: MMT 4/5 knee extension, knee flexion; LLE Sensation: WNL (Pt notes n/t intermittently BLE)       Communication   Communication: No difficulties  Cognition Arousal/Alertness: Awake/alert Behavior During Therapy: WFL for tasks assessed/performed;Impulsive;Anxious Overall Cognitive Status: Within Functional Limits for tasks assessed                                 General Comments: AOx4, slightly impulsive with activity      General Comments      Exercises     Assessment/Plan    PT Assessment Patient needs continued PT services  PT Problem List Decreased strength;Decreased range of motion;Impaired tone;Decreased activity tolerance;Decreased balance;Decreased mobility;Decreased coordination       PT Treatment Interventions Balance training;Neuromuscular re-education;Gait training;Stair training;Functional mobility training;Therapeutic activities;Therapeutic exercise    PT Goals (Current goals can be found in the Care Plan section)  Acute Rehab PT Goals Patient Stated Goal: To get out of bed PT Goal Formulation: With patient Time For Goal Achievement: 08/12/21 Potential to Achieve Goals: Good    Frequency Min 2X/week   Barriers to discharge         Co-evaluation               AM-PAC PT "6 Clicks" Mobility  Outcome Measure Help needed turning from your back to your side while in a flat bed without using bedrails?: A Little Help needed moving from lying on your back to sitting on the side of a flat bed without using bedrails?: A Little Help needed moving to and from a bed to a chair (including a wheelchair)?: A Little Help needed standing up from a chair using your arms (e.g., wheelchair or bedside chair)?: A Little Help needed to walk in hospital room?: A Little Help needed climbing 3-5 steps with a railing? : A Lot 6 Click Score: 17    End of Session Equipment Utilized During Treatment: Gait belt Activity Tolerance: Patient tolerated treatment well;Patient limited by fatigue Patient left: in chair;with call bell/phone within reach;with chair alarm set Nurse Communication: Mobility status PT Visit Diagnosis: Other abnormalities of gait and mobility (R26.89);Muscle weakness (generalized) (M62.81);Other symptoms and signs involving the nervous system (R29.898)    Time: 3976-7341 PT Time Calculation (min) (ACUTE ONLY): 44 min   Charges:             [employer], SPT

## 2021-07-30 DIAGNOSIS — A419 Sepsis, unspecified organism: Secondary | ICD-10-CM | POA: Diagnosis not present

## 2021-07-30 DIAGNOSIS — N390 Urinary tract infection, site not specified: Secondary | ICD-10-CM | POA: Diagnosis not present

## 2021-07-30 LAB — URINE CULTURE: Culture: >=100000 — AB

## 2021-07-30 MED ORDER — SENNOSIDES-DOCUSATE SODIUM 8.6-50 MG PO TABS
1.0000 | ORAL_TABLET | Freq: Two times a day (BID) | ORAL | Status: DC | PRN
  Administered 2021-07-30: 09:00:00 1 via ORAL
  Filled 2021-07-30: qty 1

## 2021-07-30 MED ORDER — OXYCODONE HCL 5 MG PO TABS
5.0000 mg | ORAL_TABLET | Freq: Four times a day (QID) | ORAL | Status: DC | PRN
Start: 2021-07-30 — End: 2021-07-31

## 2021-07-30 MED ORDER — POLYETHYLENE GLYCOL 3350 17 G PO PACK
17.0000 g | PACK | Freq: Two times a day (BID) | ORAL | Status: DC | PRN
  Administered 2021-07-30: 09:00:00 17 g via ORAL
  Filled 2021-07-30: qty 1

## 2021-07-30 MED ORDER — SENNOSIDES-DOCUSATE SODIUM 8.6-50 MG PO TABS
1.0000 | ORAL_TABLET | Freq: Every day | ORAL | Status: DC
  Administered 2021-07-31: 1 via ORAL
  Filled 2021-07-30: qty 1

## 2021-07-30 NOTE — Progress Notes (Signed)
Physical Therapy Treatment Patient Details Name: Adrienne Strickland MRN: [id number] DOB: 08-03-1961 Today's Date: 07/30/2021    History of Present Illness Pt is a 60 y.o. F arriving to ED after sustaining a fall at home due to LE weakness along with fever and chills. Pt admitted for sepsis due to UTI. PMH includes multiple sclerosis with neurogenic bladder s/p chronic indwelling foley catheter    PT Comments    Pt seated in recliner with partner in room beginning of PT session. Pt denies any pain. Progressed treatment to stair training. Pt ascended/descended 2 steps with RW, min-guard in forward direction. Pt and pt's partner whom observed the treatment session, indicated pt is at baseline function with stair navigation. Pt requires min-guard during transfers with LOB x 1 due increase ankle plantarflexion and posterior trunk lean w/ RW. Pt was able to correct without PT assist. Skilled PT intervention is indicated to address deficits in function, mobility, and to return to PLOF as able.  Discharge recommendations are HHPT.   Follow Up Recommendations  Home health PT;Supervision for mobility/OOB     Equipment Recommendations  3in1 (PT)    Recommendations for Other Services       Precautions / Restrictions Precautions Precautions: Fall Restrictions Weight Bearing Restrictions: No    Mobility  Bed Mobility               General bed mobility comments: Pt seated in recliner beginning of session    Transfers Overall transfer level: Needs assistance Equipment used: Rolling walker (2 wheeled) Transfers: Sit to/from Stand Sit to Stand: Min guard         General transfer comment: LOB x 1 w/ posterior trunk lean, ankle PF  Ambulation/Gait                 Stairs Stairs: Yes Stairs assistance: Min guard Stair Management: With walker;Forwards Number of Stairs: 2 General stair comments: Pt demonstrated baseline function w/ stair training per pt and pt's partner whom was  present during throughout therapy   Wheelchair Mobility    Modified Rankin (Stroke Patients Only)       Balance Overall balance assessment: Needs assistance Sitting-balance support: Feet supported;Bilateral upper extremity supported Sitting balance-Leahy Scale: Fair     Standing balance support: Bilateral upper extremity supported;During functional activity Standing balance-Leahy Scale: Poor Standing balance comment: requires BUE support                            Cognition Arousal/Alertness: Awake/alert Behavior During Therapy: WFL for tasks assessed/performed Overall Cognitive Status: Within Functional Limits for tasks assessed                                        Exercises      General Comments        Pertinent Vitals/Pain Pain Assessment: No/denies pain Pain Intervention(s): Monitored during session    Home Living                      Prior Function            PT Goals (current goals can now be found in the care plan section) Acute Rehab PT Goals Patient Stated Goal: To get out of bed PT Goal Formulation: With patient Time For Goal Achievement: 08/12/21 Potential to Achieve Goals: Good Progress towards PT goals:  Progressing toward goals    Frequency    Min 2X/week      PT Plan Current plan remains appropriate    Co-evaluation              AM-PAC PT "6 Clicks" Mobility   Outcome Measure  Help needed turning from your back to your side while in a flat bed without using bedrails?: A Little Help needed moving from lying on your back to sitting on the side of a flat bed without using bedrails?: A Little Help needed moving to and from a bed to a chair (including a wheelchair)?: A Little Help needed standing up from a chair using your arms (e.g., wheelchair or bedside chair)?: A Little Help needed to walk in hospital room?: A Little Help needed climbing 3-5 steps with a railing? : A Lot 6 Click Score:  17    End of Session Equipment Utilized During Treatment: Gait belt Activity Tolerance: Patient tolerated treatment well Patient left: in chair;with call bell/phone within reach;with chair alarm set;with family/visitor present Nurse Communication: Mobility status PT Visit Diagnosis: Other abnormalities of gait and mobility (R26.89);Muscle weakness (generalized) (M62.81);Other symptoms and signs involving the nervous system (R29.898)     Time: 3086-5784 PT Time Calculation (min) (ACUTE ONLY): 27 min  Charges:                        [employer], SPT

## 2021-07-30 NOTE — TOC Progression Note (Signed)
Transition of Care [facility]) - Progression Note    Patient Details  Name: Adrienne Strickland MRN: [id number] Date of Birth: [birth year]  Transition of Care [facility]) CM/SW Contact  Shelbie Hutching, RN Phone Number: 07/30/2021, 2:38 PM  Clinical Narrative:    RNCM met with patient at the bedside, informed patient that home health service have been arranged with [place] and it is covered at 100% by her Medicare.  OT recommends bedside commode with drop arm.  Referral for bedside commode given to Zack with [employer].   Potential for discharge tomorrow.    Expected Discharge Plan: [place] Barriers to Discharge: Continued Medical Work up  Expected Discharge Plan and Services Expected Discharge Plan: [place]   Discharge Planning Services: CM Consult Post Acute Care Choice: [place] arrangements for the past 2 months: Apartment                 DME Arranged: Bedside commode DME Agency: [employer] Date DME Agency Contacted: 07/30/21 Time DME Agency Contacted: 85 Representative spoke with at DME Agency: [place]: RN, PT, OT [facility] Agency: [place] ([place]) Date [place]: 07/29/21 Time [place]: 46 Representative spoke with at [place]: [place] ([place]) Interventions    Readmission Risk Interventions No flowsheet data found.

## 2021-07-30 NOTE — Progress Notes (Signed)
[facility] PROGRESS NOTE    Adrienne Strickland  DJM:[id number] DOB: 28-Dec-1960 DOA: 07/28/2021 PCP: Sharilyn Sites, MD      Brief Narrative:  Adrienne Strickland is a 60 y.o. F with MS, neurogenic bladder with indwelling Foley for the last 10 years who presented with fever and chills for 3 days.  Over the last several days, the patient has had decreased appetite, increased fatigue, chills, finally fever and severe malaise so she came to the ER.  In the ER she was febrile to 101, heart rate 110, WBC 11.2K, COVID-negative.  She had removed her Foley at home, and a new Foley was placed and clean-catch urine showed pyuria.  Chest x-ray clear.         Assessment & Plan:  Sepsis due to indwelling Foley associated UTI due to E. Coli E. coli bacteremia Patient presented with tachycardia, fever in the setting of UTI.  Later found to have E. coli bacteremia.  Clinically improving - Continue Rocephin - Follow blood culture sensitivities   Multiple sclerosis - Continue baclofen and memantine  Hypokalemia Supplemented and resolved         Disposition: Status is: Inpatient  Remains inpatient appropriate because: Given the severity of presentation, and the presence of bacteremia, I feel that IV therapy is warranted until sensitivities can tailor narrowing to oral treatment and discharge home, likely tomorrow  Dispo: The patient is from: Home              Anticipated d/c is to: Home              Patient currently is not medically stable to d/c.   Difficult to place patient No       Level of care: Med-Surg       MDM: The below labs and imaging reports were reviewed and summarized above.  Medication management as above.     DVT prophylaxis: Lovenox  Code Status: FULL Family Communication: Significant other by phone    Antimicrobials:  8/3 Rocephin>>  Culture data:  8/3 urine culture: E. coli 8/3 blood culture: 1 out of 2 with E.  coli          Subjective: No further fever.  No flank pain, abdominal pain, vomiting.  Appetite good.  She is constipated.    Objective: Vitals:   07/29/21 2017 07/29/21 2341 07/30/21 0539 07/30/21 0756  BP: 133/73 134/75 (!) 145/67 138/76  Pulse: 74 84 65 70  Resp: 18 15 18 18   Temp: 98 F (36.7 C) 98.6 F (37 C) 98.6 F (37 C) 97.7 F (36.5 C)  TempSrc:      SpO2: 99% 97% 99% 100%  Weight:      Height:        Intake/Output Summary (Last 24 hours) at 07/30/2021 1014 Last data filed at 07/30/2021 1011 Gross per 24 hour  Intake 1200 ml  Output 4200 ml  Net -3000 ml   Filed Weights   07/28/21 0912  Weight: 92.1 kg    Examination: General appearance: Adult female, sitting up in a chair, no acute distress     HEENT:    Skin:  Cardiac: Regular rate and rhythm, no murmurs, no JVD, mild nonpitting lower extremity edema Respiratory: Normal respiratory rate and rhythm, lungs clear without rales or wheezes Abdomen: Without tenderness palpation or guarding, no ascites or distention MSK:  Neuro: Awake and alert, extraocular movements intact, moves all extremities with normal strength, some spasticity Psych: Intact responding to questions,  attention normal, affect pleasant, judgment insight appear normal     Data Reviewed: I have personally reviewed following labs and imaging studies:  CBC: Recent Labs  Lab 07/28/21 0916 07/29/21 0425  WBC 11.2* 8.9  NEUTROABS 9.8*  --   HGB 12.4 12.6  HCT 36.5 37.8  MCV 89.0 89.8  PLT 200 197   Basic Metabolic Panel: Recent Labs  Lab 07/28/21 0916 07/29/21 0425  NA 139 140  K 3.4* 4.4  CL 106 107  CO2 22 26  GLUCOSE 120* 107*  BUN 18 13  CREATININE 1.07* 0.96  CALCIUM 8.2* 8.6*   GFR: Estimated Creatinine Clearance: 73 mL/min (by C-G formula based on SCr of 0.96 mg/dL). Liver Function Tests: Recent Labs  Lab 07/28/21 0916  AST 27  ALT 27  ALKPHOS 95  BILITOT 1.1  PROT 6.1*  ALBUMIN 3.0*   No results for  input(s): LIPASE, AMYLASE in the last 168 hours. No results for input(s): AMMONIA in the last 168 hours. Coagulation Profile: Recent Labs  Lab 07/28/21 0916  INR 1.3*   Cardiac Enzymes: No results for input(s): CKTOTAL, CKMB, CKMBINDEX, TROPONINI in the last 168 hours. BNP (last 3 results) No results for input(s): PROBNP in the last 8760 hours. HbA1C: No results for input(s): HGBA1C in the last 72 hours. CBG: No results for input(s): GLUCAP in the last 168 hours. Lipid Profile: No results for input(s): CHOL, HDL, LDLCALC, TRIG, CHOLHDL, LDLDIRECT in the last 72 hours. Thyroid Function Tests: No results for input(s): TSH, T4TOTAL, FREET4, T3FREE, THYROIDAB in the last 72 hours. Anemia Panel: No results for input(s): VITAMINB12, FOLATE, FERRITIN, TIBC, IRON, RETICCTPCT in the last 72 hours. Urine analysis:    Component Value Date/Time   COLORURINE AMBER (A) 07/28/2021 0916   APPEARANCEUR CLOUDY (A) 07/28/2021 0916   APPEARANCEUR Cloudy (A) 06/30/2021 1020   LABSPEC 1.015 07/28/2021 0916   PHURINE 6.0 07/28/2021 0916   GLUCOSEU NEGATIVE 07/28/2021 0916   HGBUR SMALL (A) 07/28/2021 0916   BILIRUBINUR NEGATIVE 07/28/2021 0916   BILIRUBINUR Negative 06/30/2021 1020   KETONESUR NEGATIVE 07/28/2021 0916   PROTEINUR 100 (A) 07/28/2021 0916   NITRITE NEGATIVE 07/28/2021 0916   LEUKOCYTESUR LARGE (A) 07/28/2021 0916   Sepsis Labs: @LABRCNTIP (procalcitonin:4,lacticacidven:4)  ) Recent Results (from the past 240 hour(s))  Blood culture (routine single)     Status: Abnormal (Preliminary result)   Collection Time: 07/28/21  9:16 AM   Specimen: BLOOD  Result Value Ref Range Status   Specimen Description   Final    BLOOD LEFT ANTECUBITAL Performed at [facility], [address], [place], [place]    Special Requests   Final    BOTTLES DRAWN AEROBIC AND ANAEROBIC Blood Culture adequate volume Performed at [facility], [address],  [place], [place]    Culture  Setup Time   Final    GRAM NEGATIVE RODS ANAEROBIC BOTTLE ONLY Organism ID to follow CRITICAL RESULT CALLED TO, READ BACK BY AND VERIFIED WITH: JASON ROBBINS@0017  07/29/21 RH Performed at [facility], [address] [place], [place]    Culture (A)  Final    ESCHERICHIA COLI SUSCEPTIBILITIES TO FOLLOW Performed at [facility], [address], [place], [place]    Report Status PENDING  Incomplete  Urine Culture     Status: Abnormal (Preliminary result)   Collection Time: 07/28/21  9:16 AM   Specimen: Urine, Catheterized  Result Value Ref Range Status   Specimen  Description   Final    URINE, CATHETERIZED Performed at [facility], [address], [place], [place] [address]    Special Requests   Final    NONE Performed at [facility], [address] [place], [place] [address]    Culture (A)  Final    >=100,000 COLONIES/mL ESCHERICHIA COLI SUSCEPTIBILITIES TO FOLLOW Performed at [facility], [address], [place], [place] [address]    Report Status PENDING  Incomplete  Blood culture (single)     Status: None (Preliminary result)   Collection Time: 07/28/21  9:16 AM   Specimen: BLOOD  Result Value Ref Range Status   Specimen Description BLOOD BLOOD LEFT WRIST  Final   Special Requests   Final    BOTTLES DRAWN AEROBIC AND ANAEROBIC Blood Culture adequate volume   Culture   Final    NO GROWTH 2 DAYS Performed at [facility], [address], [place], [place] [address]    Report Status PENDING  Incomplete  Resp Panel by RT-PCR (Flu A&B, Covid) Nasopharyngeal Swab     Status: None   Collection Time: 07/28/21  9:16 AM   Specimen: Nasopharyngeal Swab; Nasopharyngeal(NP) swabs in vial transport medium  Result Value Ref Range Status   SARS Coronavirus 2 by RT PCR NEGATIVE NEGATIVE Final    Comment: (NOTE) SARS-CoV-2 target nucleic acids are NOT DETECTED.  The  SARS-CoV-2 RNA is generally detectable in upper respiratory specimens during the acute phase of infection. The lowest concentration of SARS-CoV-2 viral copies this assay can detect is 138 copies/mL. A negative result does not preclude SARS-Cov-2 infection and should not be used as the sole basis for treatment or other patient management decisions. A negative result may occur with  improper specimen collection/handling, submission of specimen other than nasopharyngeal swab, presence of viral mutation(s) within the areas targeted by this assay, and inadequate number of viral copies(<138 copies/mL). A negative result must be combined with clinical observations, patient history, and epidemiological information. The expected result is Negative.  Fact Sheet for Patients:  BloggerCourse.com  Fact Sheet for Healthcare Providers:  SeriousBroker.it  This test is no t yet approved or cleared by the [place] FDA and  has been authorized for detection and/or diagnosis of SARS-CoV-2 by FDA under an Emergency Use Authorization (EUA). This EUA will remain  in effect (meaning this test can be used) for the duration of the COVID-19 declaration under Section 564(b)(1) of the Act, 21 U.S.C.section 360bbb-3(b)(1), unless the authorization is terminated  or revoked sooner.       Influenza A by PCR NEGATIVE NEGATIVE Final   Influenza B by PCR NEGATIVE NEGATIVE Final    Comment: (NOTE) The Xpert Xpress SARS-CoV-2/FLU/RSV plus assay is intended as an aid in the diagnosis of influenza from Nasopharyngeal swab specimens and should not be used as a sole basis for treatment. Nasal washings and aspirates are unacceptable for Xpert Xpress SARS-CoV-2/FLU/RSV testing.  Fact Sheet for Patients: BloggerCourse.com  Fact Sheet for Healthcare Providers: SeriousBroker.it  This test is not yet approved or  cleared by the [place] FDA and has been authorized for detection and/or diagnosis of SARS-CoV-2 by FDA under an Emergency Use Authorization (EUA). This EUA will remain in effect (meaning this test can be used) for the duration of the COVID-19 declaration under Section 564(b)(1) of the Act, 21 U.S.C. section 360bbb-3(b)(1), unless the authorization is terminated or revoked.  Performed at [facility], [address], [place], [place] [address]  Blood Culture ID Panel (Reflexed)     Status: Abnormal   Collection Time: 07/28/21  9:16 AM  Result Value Ref Range Status   Enterococcus faecalis NOT DETECTED NOT DETECTED Final   Enterococcus Faecium NOT DETECTED NOT DETECTED Final   Listeria monocytogenes NOT DETECTED NOT DETECTED Final   Staphylococcus species NOT DETECTED NOT DETECTED Final   Staphylococcus aureus (BCID) NOT DETECTED NOT DETECTED Final   Staphylococcus epidermidis NOT DETECTED NOT DETECTED Final   Staphylococcus lugdunensis NOT DETECTED NOT DETECTED Final   Streptococcus species NOT DETECTED NOT DETECTED Final   Streptococcus agalactiae NOT DETECTED NOT DETECTED Final   Streptococcus pneumoniae NOT DETECTED NOT DETECTED Final   Streptococcus pyogenes NOT DETECTED NOT DETECTED Final   A.calcoaceticus-baumannii NOT DETECTED NOT DETECTED Final   Bacteroides fragilis NOT DETECTED NOT DETECTED Final   Enterobacterales DETECTED (A) NOT DETECTED Final    Comment: Enterobacterales represent a large order of gram negative bacteria, not a single organism. CRITICAL RESULT CALLED TO, READ BACK BY AND VERIFIED WITH: JASON ROBBINS@0017  07/29/21 RH    Enterobacter cloacae complex NOT DETECTED NOT DETECTED Final   Escherichia coli DETECTED (A) NOT DETECTED Final    Comment: CRITICAL RESULT CALLED TO, READ BACK BY AND VERIFIED WITH: JASON ROBBINS@0017  07/29/21 RH    Klebsiella aerogenes NOT DETECTED NOT DETECTED Final   Klebsiella oxytoca NOT DETECTED NOT DETECTED Final    Klebsiella pneumoniae NOT DETECTED NOT DETECTED Final   Proteus species NOT DETECTED NOT DETECTED Final   Salmonella species NOT DETECTED NOT DETECTED Final   Serratia marcescens NOT DETECTED NOT DETECTED Final   Haemophilus influenzae NOT DETECTED NOT DETECTED Final   Neisseria meningitidis NOT DETECTED NOT DETECTED Final   Pseudomonas aeruginosa NOT DETECTED NOT DETECTED Final   Stenotrophomonas maltophilia NOT DETECTED NOT DETECTED Final   Candida albicans NOT DETECTED NOT DETECTED Final   Candida auris NOT DETECTED NOT DETECTED Final   Candida glabrata NOT DETECTED NOT DETECTED Final   Candida krusei NOT DETECTED NOT DETECTED Final   Candida parapsilosis NOT DETECTED NOT DETECTED Final   Candida tropicalis NOT DETECTED NOT DETECTED Final   Cryptococcus neoformans/gattii NOT DETECTED NOT DETECTED Final   CTX-M ESBL NOT DETECTED NOT DETECTED Final   Carbapenem resistance IMP NOT DETECTED NOT DETECTED Final   Carbapenem resistance KPC NOT DETECTED NOT DETECTED Final   Carbapenem resistance NDM NOT DETECTED NOT DETECTED Final   Carbapenem resist OXA 48 LIKE NOT DETECTED NOT DETECTED Final   Carbapenem resistance VIM NOT DETECTED NOT DETECTED Final    Comment: Performed at [facility], [address] [place], [place] [id number]         Radiology Studies: DG Chest Port 1 View  Result Date: 07/28/2021 CLINICAL DATA:  Questionable sepsis EXAM: PORTABLE CHEST 1 VIEW COMPARISON:  None. FINDINGS: The heart size and mediastinal contours are within normal limits. Both lungs are clear. No acute pleural abnormality. IMPRESSION: Lungs are clear Electronically Signed   By: Allegra LaiLeah  Strickland MD   On: 07/28/2021 10:38        Scheduled Meds:  baclofen  10 mg Oral BID   Chlorhexidine Gluconate Cloth  6 each Topical Daily   cholecalciferol  1,000 Units Oral Daily   enoxaparin (LOVENOX) injection  0.5 mg/kg Subcutaneous Q24H   fluticasone  2 spray Each Nare Daily   memantine  10  mg Oral BID   oxyCODONE  5 mg Oral Q6H   vitamin B-12  1,000 mcg Oral Daily  Continuous Infusions:  cefTRIAXone (ROCEPHIN)  IV 2 g (07/30/21 0212)     LOS: 2 days    Time spent: 25 minutes    Alberteen Sam, MD [facility] 07/30/2021, 10:14 AM     Please page though AMION or Epic secure chat:  For [employer], Higher education careers adviser

## 2021-07-30 NOTE — Evaluation (Signed)
Occupational Therapy Evaluation Patient Details Name: Adrienne Strickland MRN: [id number] DOB: 06-Feb-1961 Today's Date: 07/30/2021    History of Present Illness Pt is a 60 y.o. F arriving to ED after sustaining a fall at home due to LE weakness along with fever and chills. Pt admitted for sepsis due to UTI. PMH includes multiple sclerosis with neurogenic bladder s/p chronic indwelling foley catheter   Clinical Impression   Pt was seen for OT evaluation this date. Prior to hospital admission, pt was requiring increasing assist for basic ADL tasks from spouse 2/2 decreased strength and activity tolerance. Pt lives with spouse in an apartment with tub/shower. Spouse works and while he is at work, pt remains in a chair with food/drink in reach. She endorses wearing disposable briefs for BMs and spouse assists with clean up afterwards once home. She reports unable to reach behind her far enough to perform pericare. Pt reports not getting up on her own without spouse's supervision/assist available. Both pt and spouse eager to maximize pt's independence. Currently pt demonstrates impairments as described below (See OT problem list) which functionally limit *his/her ability to perform ADL/self-care tasks. Pt currently requires MOD A for LB ADL, MAX A for pericare, and CGA +  RW for ADL transfers. Pt/spouse educated in AE/DME for ADL (bathing, dressing, and toileting) to support safety/indep and minimize falls risk and risk of injury. Both very interested in additional instruction. Pt would benefit from skilled OT services to address noted impairments and functional limitations (see below for any additional details) in order to maximize safety and independence while minimizing falls risk and caregiver burden. Upon hospital discharge, recommend HHOT to maximize pt safety and return to functional independence during meaningful occupations of daily life. Also recommend drop arm BSC to support SPT vs lateral scoot transfers  for toileting needs.      Follow Up Recommendations  Home health OT    Equipment Recommendations  3 in 1 bedside commode;Other (comment) (drop arm BSC, reacher, LH sponge, sock aide, toileting aide, HH shower head)    Recommendations for Other Services       Precautions / Restrictions Precautions Precautions: Fall Restrictions Weight Bearing Restrictions: No      Mobility Bed Mobility               General bed mobility comments: NT up in recliner    Transfers     Balance                            ADL either performed or assessed with clinical judgement   ADL Overall ADL's : Needs assistance/impaired                                       General ADL Comments: Pt currently requires MOD A for LB ADL, MAX A for pericare, CGA + RW for ADL transfers     Vision         Perception     Praxis      Pertinent Vitals/Pain Pain Assessment: No/denies pain Pain Intervention(s): Monitored during session     Hand Dominance Left   Extremity/Trunk Assessment Upper Extremity Assessment Upper Extremity Assessment: Overall WFL for tasks assessed   Lower Extremity Assessment Lower Extremity Assessment: RLE deficits/detail;LLE deficits/detail RLE Deficits / Details: MMT 4/5 knee extension, knee flexion; 5/5 dorsiflexion; reflexes 4+, clonus RLE Sensation:  WNL (Pt notes n/t intermittently BLE) LLE Deficits / Details: MMT 4/5 knee extension, knee flexion; LLE Sensation: WNL (Pt notes n/t intermittently BLE)       Communication Communication Communication: No difficulties   Cognition Arousal/Alertness: Awake/alert Behavior During Therapy: WFL for tasks assessed/performed Overall Cognitive Status: Within Functional Limits for tasks assessed                                     General Comments       Exercises Other Exercises Other Exercises: Pt/spouse educated in AE/DME for ADL (bathing, dressing, and toileting) to  support safety/indep and minimize falls risk and risk of injury. Both very interested in additional instruction.   Shoulder Instructions      Home Living Family/patient expects to be discharged to:: Private residence Living Arrangements: Spouse/significant other Available Help at Discharge: Family (spouse) Type of Home: Apartment Home Access: Stairs to enter [employer] of Steps: 3 Entrance Stairs-Rails: None (pt uncertain if rails are present) Home Layout: One level     Bathroom Shower/Tub: Chief Strategy Officer: Standard     Home Equipment: Environmental consultant - 2 wheels;Shower seat;Grab bars - toilet;Grab bars - tub/shower;Other (comment) (Per chart review, pt is awaiting WC)          Prior Functioning/Environment Level of Independence: Needs assistance  ADL's / Homemaking Assistance Needed: Pt requires assistance for getting into tub,shower. Pt is able to sit and bath without assistance until recently requiring assist for LB bathing and dressing. Pt requires assistance for all other ADLs including getting to and from bathroom for BM. Pt reports typically she wears disposable briefs and will have a BM and spouse helps her with pericare once he gets home from work.            OT Problem List: Decreased strength;Decreased range of motion;Impaired balance (sitting and/or standing);Decreased activity tolerance;Decreased knowledge of use of DME or AE      OT Treatment/Interventions: Self-care/ADL training;Therapeutic exercise;Therapeutic activities;Energy conservation;DME and/or AE instruction;Patient/family education;Balance training;Neuromuscular education    OT Goals(Current goals can be found in the care plan section) Acute Rehab OT Goals Patient Stated Goal: to be safe and as independent as possible OT Goal Formulation: With patient/family Time For Goal Achievement: 08/13/21 Potential to Achieve Goals: Good ADL Goals Pt Will Perform Lower Body Dressing: with  adaptive equipment;sitting/lateral leans;with supervision Pt Will Transfer to Toilet: with modified independence;stand pivot transfer;bedside commode (SPT or lateral scoot to drop arm BSC vs BSC, RW PRN) Pt Will Perform Toileting - Clothing Manipulation and hygiene: with adaptive equipment;sitting/lateral leans;with modified independence  OT Frequency: Min 2X/week   Barriers to D/C:            Co-evaluation              AM-PAC OT "6 Clicks" Daily Activity     Outcome Measure Help from another person eating meals?: None Help from another person taking care of personal grooming?: A Little Help from another person toileting, which includes using toliet, bedpan, or urinal?: A Lot Help from another person bathing (including washing, rinsing, drying)?: A Lot Help from another person to put on and taking off regular upper body clothing?: A Little Help from another person to put on and taking off regular lower body clothing?: A Lot 6 Click Score: 16   End of Session    Activity Tolerance: Patient tolerated treatment well Patient  left: in chair;with call bell/phone within reach;with chair alarm set  OT Visit Diagnosis: Other abnormalities of gait and mobility (R26.89);Muscle weakness (generalized) (M62.81)                Time: 7858-8502 OT Time Calculation (min): 31 min Charges:  OT General Charges $OT Visit: 1 Visit OT Evaluation $OT Eval Moderate Complexity: 1 Mod OT Treatments $Self Care/Home Management : 23-37 mins  Wynona Canes, MPH, MS, OTR/L ascom [phone] 07/30/21, 1:09 PM

## 2021-07-31 DIAGNOSIS — A419 Sepsis, unspecified organism: Secondary | ICD-10-CM | POA: Diagnosis not present

## 2021-07-31 DIAGNOSIS — N390 Urinary tract infection, site not specified: Secondary | ICD-10-CM | POA: Diagnosis not present

## 2021-07-31 LAB — CULTURE, BLOOD (SINGLE): Special Requests: ADEQUATE

## 2021-07-31 MED ORDER — CEPHALEXIN 500 MG PO CAPS: 500.0000 mg | ORAL_CAPSULE | Freq: Four times a day (QID) | ORAL | 0 refills | Status: AC

## 2021-07-31 NOTE — TOC Transition Note (Signed)
Transition of Care [facility]) - CM/SW Discharge Note   Patient Details  Name: Adrienne Strickland MRN: [id number] Date of Birth: 31-Oct-1961  Transition of Care [facility]) CM/SW Contact:  Liliana Cline, LCSW Phone Number: 07/31/2021, 9:57 AM   Clinical Narrative:   Patient to discharge home today with home health. CSW notified Barbara Cower with [facility] of discharge.    Final next level of care: Home w Home Health Services Barriers to Discharge: Barriers Resolved   Patient Goals and CMS Choice Patient states their goals for this hospitalization and ongoing recovery are:: Patient hopes to go home tomorrow CMS Medicare.gov Compare Post Acute Care list provided to:: Patient Choice offered to / list presented to : Patient  Discharge Placement                  Name of family member notified: patient aware    Discharge Plan and Services   Discharge Planning Services: CM Consult Post Acute Care Choice: Home Health          DME Arranged: Bedside commode DME Agency: [facility] Date DME Agency Contacted: 07/30/21 Time DME Agency Contacted: 1438 Representative spoke with at DME Agency: [facility] HH Arranged: RN, PT, OT [facility] Agency: [facility] Date HH Agency Contacted: 07/31/21 Time HH Agency Contacted: 1426 Representative spoke with at [facility] Agency: Barbara Cower  Social Determinants of Health (SDOH) Interventions     Readmission Risk Interventions No flowsheet data found.

## 2021-07-31 NOTE — Progress Notes (Signed)
[name] to be D/C'd Home per MD order.  Discussed prescriptions and follow up appointments with the patient. Prescriptions given to patient, medication list explained in detail. Pt verbalized understanding.  Allergies as of 07/31/2021       Reactions   Sulfa Antibiotics Nausea And Vomiting   Tetanus Toxoids Swelling   Extreme swelling at injection site        Medication List     STOP taking these medications    dalfampridine 10 MG Tb12   Dimethyl Fumarate 120 & 240 MG Misc   donepezil 10 MG tablet Commonly known as: ARICEPT       TAKE these medications    baclofen 10 MG tablet Commonly known as: LIORESAL Take 1 tablet by mouth 2 (two) times daily.   baclofen 10 MG tablet Commonly known as: LIORESAL Take 10 mg by mouth 2 (two) times daily.   cephALEXin 500 MG capsule Commonly known as: KEFLEX Take 1 capsule (500 mg total) by mouth 4 (four) times daily for 7 days.   fluticasone 50 MCG/ACT nasal spray Commonly known as: FLONASE Place 2 sprays into both nostrils daily.   memantine 10 MG tablet Commonly known as: NAMENDA Take 1 tablet by mouth 2 (two) times daily.   oxycodone 5 MG capsule Commonly known as: OXY-IR Take 5 mg by mouth every 6 (six) hours.   vitamin B-12 1000 MCG tablet Commonly known as: CYANOCOBALAMIN Take 1,000 mcg by mouth daily.   Vitamin D-3 25 MCG (1000 UT) Caps Take 1,000 Units by mouth daily.               Durable Medical Equipment  (From admission, onward)           Start     Ordered   07/31/21 0810  For home use only DME Other see comment  Once       Comments: Drop arm Bed side commode, reacher, LH sponge, sock aide, toileting aide, HH shower head  Question:  Length of Need  Answer:  Lifetime   07/31/21 0810   07/31/21 0809  DME 3-in-1  Once        07/31/21 0809   07/30/21 1225  For home use only DME Bedside commode  Once       Comments: With drop arm so patient can slide from chair to commode Patient has MS   Question:  Patient needs a bedside commode to treat with the following condition  Answer:  At high risk for falls   07/30/21 1226            Vitals:   07/31/21 0449 07/31/21 0722  BP: 126/69 (!) 126/57  Pulse: 70 66  Resp: 16 17  Temp: 97.7 F (36.5 C) (!) 97.4 F (36.3 C)  SpO2: 99% 97%    IV catheter discontinued intact. Site without signs and symptoms of complications. Dressing and pressure applied. Pt denies pain at this time. No complaints noted.  An After Visit Summary was printed and given to the patient. Patient escorted via WC, and D/C home via private auto.  Rigoberto Noel

## 2021-07-31 NOTE — Discharge Summary (Signed)
Physician Discharge Summary  JOELLYN GRANDT CWC:[id number] DOB: 11-26-1961 DOA: 07/28/2021  PCP: Sharilyn Sites, MD  Admit date: 07/28/2021 Discharge date: 07/31/2021  Admitted From: Home  Disposition:  Home with HH   Recommendations for Outpatient Follow-up:  Follow up with PCP Dr. Marolyn Haller in 1-2 weeks        Home Health: PT/OT due to inability to leave home unassisted  Equipment/Devices: BSC/3-in-1, reacher, sock aide    Discharge Condition: Good  CODE STATUS: FULL Diet recommendation: Regular  Brief/Interim Summary: Adrienne Strickland is a 60 y.o. F with MS, neurogenic bladder with indwelling Foley for the last 10 years who presented with fever and chills for 3 days.  Over the last several days, the patient has had decreased appetite, increased fatigue, chills, finally fever and severe malaise so she came to the ER.  In the ER she was febrile to 101, heart rate 110, WBC 11.2K, COVID-negative.  She had removed her Foley at home, and a new Foley was placed and clean-catch urine showed pyuria.  Chest x-ray clear.       PRINCIPAL HOSPITAL DIAGNOSIS: Sepsis    Discharge Diagnoses:  Sepsis due to indwelling Foley associated UTI due to E. Coli E. coli bacteremia Patient presented with tachycardia, fever in the setting of UTI.  Later found to have E. coli bacteremia.   Treated with Rocephin.  Foley replaced.  Urine cultures and blood culture (1 of 2) grew E coli sensitive to cephalosporins.  She was discharged with cephalexin to complete 10 days.       Multiple sclerosis   Hypokalemia Supplemented and resolved          Discharge Instructions  Discharge Instructions     Diet - low sodium heart healthy   Complete by: As directed    Discharge instructions   Complete by: As directed    From Dr. Maryfrances Bunnell: You were admitted for urinary tract infection from your catheter, spread to the blood stream. The blood and urine cultures grew E coli, which is a common  bacteria in the urine.  You were treated with antibiotics here. Take cephalexin 500 mg 4 times daily for the next 7 days.  Go see  your primary care doctor in 1 week   Increase activity slowly   Complete by: As directed       Allergies as of 07/31/2021       Reactions   Sulfa Antibiotics Nausea And Vomiting   Tetanus Toxoids Swelling   Extreme swelling at injection site        Medication List     STOP taking these medications    dalfampridine 10 MG Tb12   Dimethyl Fumarate 120 & 240 MG Misc   donepezil 10 MG tablet Commonly known as: ARICEPT       TAKE these medications    baclofen 10 MG tablet Commonly known as: LIORESAL Take 1 tablet by mouth 2 (two) times daily.   baclofen 10 MG tablet Commonly known as: LIORESAL Take 10 mg by mouth 2 (two) times daily.   cephALEXin 500 MG capsule Commonly known as: KEFLEX Take 1 capsule (500 mg total) by mouth 4 (four) times daily for 7 days.   fluticasone 50 MCG/ACT nasal spray Commonly known as: FLONASE Place 2 sprays into both nostrils daily.   memantine 10 MG tablet Commonly known as: NAMENDA Take 1 tablet by mouth 2 (two) times daily.   oxycodone 5 MG capsule Commonly known as: OXY-IR Take 5 mg by mouth  every 6 (six) hours.   vitamin B-12 1000 MCG tablet Commonly known as: CYANOCOBALAMIN Take 1,000 mcg by mouth daily.   Vitamin D-3 25 MCG (1000 UT) Caps Take 1,000 Units by mouth daily.               Durable Medical Equipment  (From admission, onward)           Start     Ordered   07/31/21 0810  For home use only DME Other see comment  Once       Comments: Drop arm Bed side commode, reacher, LH sponge, sock aide, toileting aide, HH shower head  Question:  Length of Need  Answer:  Lifetime   07/31/21 0810   07/31/21 0809  DME 3-in-1  Once        07/31/21 0809   07/30/21 1225  For home use only DME Bedside commode  Once       Comments: With drop arm so patient can slide from chair to  commode Patient has MS  Question:  Patient needs a bedside commode to treat with the following condition  Answer:  At high risk for falls   07/30/21 1226            Follow-up Information     [place], Loraine Leriche, MD. Schedule an appointment as soon as possible for a visit in 1 week(s).   Specialty: Family Medicine Contact information: [address] [facility] [place] [address] [phone]                Allergies  Allergen Reactions   Sulfa Antibiotics Nausea And Vomiting   Tetanus Toxoids Swelling    Extreme swelling at injection site       Procedures/Studies: DG Chest Port 1 View  Result Date: 07/28/2021 CLINICAL DATA:  Questionable sepsis EXAM: PORTABLE CHEST 1 VIEW COMPARISON:  None. FINDINGS: The heart size and mediastinal contours are within normal limits. Both lungs are clear. No acute pleural abnormality. IMPRESSION: Lungs are clear Electronically Signed   By: Allegra Lai MD   On: 07/28/2021 10:38      Subjective: Feeling well.  No flank pain, no vomiting, no fever.  No confusion.  Discharge Exam: Vitals:   07/31/21 0449 07/31/21 0722  BP: 126/69 (!) 126/57  Pulse: 70 66  Resp: 16 17  Temp: 97.7 F (36.5 C) (!) 97.4 F (36.3 C)  SpO2: 99% 97%   Vitals:   07/30/21 2006 07/31/21 0016 07/31/21 0449 07/31/21 0722  BP: 131/77 114/61 126/69 (!) 126/57  Pulse: 82 63 70 66  Resp: 16 17 16 17   Temp: 97.9 F (36.6 C) 97.6 F (36.4 C) 97.7 F (36.5 C) (!) 97.4 F (36.3 C)  TempSrc: Oral Oral Oral Oral  SpO2: 100% 100% 99% 97%  Weight:      Height:        General: Pt is alert, awake, not in acute distress Cardiovascular: RRR, nl S1-S2, no murmurs appreciated.   No LE edema.   Respiratory: Normal respiratory rate and rhythm.  CTAB without rales or wheezes. Abdominal: Abdomen soft and non-tender.  No distension or HSM.   Neuro/Psych: Strength symmetric in upper and lower extremities, somwhat spastic.  Judgment and insight  appear normal.   The results of significant diagnostics from this hospitalization (including imaging, microbiology, ancillary and laboratory) are listed below for reference.     Microbiology: Recent Results (from the past 240 hour(s))  Blood culture (routine single)  Status: Abnormal (Preliminary result)   Collection Time: 07/28/21  9:16 AM   Specimen: BLOOD  Result Value Ref Range Status   Specimen Description   Final    BLOOD LEFT ANTECUBITAL Performed at [facility], [address] [place], [place] [address]    Special Requests   Final    BOTTLES DRAWN AEROBIC AND ANAEROBIC Blood Culture adequate volume Performed at [facility], [address], [place], [place] [address]    Culture  Setup Time   Final    GRAM NEGATIVE RODS ANAEROBIC BOTTLE ONLY Organism ID to follow CRITICAL RESULT CALLED TO, READ BACK BY AND VERIFIED WITH: JASON ROBBINS@0017  07/29/21 RH Performed at [facility], [address] [place], [place] [address]    Culture (A)  Final    ESCHERICHIA COLI SUSCEPTIBILITIES TO FOLLOW Performed at [facility], [address] [place], [place] [address]    Report Status PENDING  Incomplete  Urine Culture     Status: Abnormal   Collection Time: 07/28/21  9:16 AM   Specimen: Urine, Catheterized  Result Value Ref Range Status   Specimen Description   Final    URINE, CATHETERIZED Performed at [facility], [address] [place], [place] [address]    Special Requests   Final    NONE Performed at [facility], [address] [place], [place] [address]    Culture >=100,000 COLONIES/mL ESCHERICHIA COLI (A)  Final   Report Status 07/30/2021 FINAL  Final   Organism ID, Bacteria ESCHERICHIA COLI (A)  Final      Susceptibility   Escherichia coli - MIC*    AMPICILLIN >=32 RESISTANT Resistant     CEFAZOLIN 16 SENSITIVE Sensitive     CEFEPIME <=0.12 SENSITIVE Sensitive     CEFTRIAXONE <=0.25 SENSITIVE  Sensitive     CIPROFLOXACIN >=4 RESISTANT Resistant     GENTAMICIN <=1 SENSITIVE Sensitive     IMIPENEM <=0.25 SENSITIVE Sensitive     NITROFURANTOIN <=16 SENSITIVE Sensitive     TRIMETH/SULFA <=20 SENSITIVE Sensitive     AMPICILLIN/SULBACTAM >=32 RESISTANT Resistant     PIP/TAZO <=4 SENSITIVE Sensitive     * >=100,000 COLONIES/mL ESCHERICHIA COLI  Blood culture (single)     Status: None (Preliminary result)   Collection Time: 07/28/21  9:16 AM   Specimen: BLOOD  Result Value Ref Range Status   Specimen Description BLOOD BLOOD LEFT WRIST  Final   Special Requests   Final    BOTTLES DRAWN AEROBIC AND ANAEROBIC Blood Culture adequate volume   Culture   Final    NO GROWTH 3 DAYS Performed at [facility], [address] [place], [place] [address]    Report Status PENDING  Incomplete  Resp Panel by RT-PCR (Flu A&B, Covid) Nasopharyngeal Swab     Status: None   Collection Time: 07/28/21  9:16 AM   Specimen: Nasopharyngeal Swab; Nasopharyngeal(NP) swabs in vial transport medium  Result Value Ref Range Status   SARS Coronavirus 2 by RT PCR NEGATIVE NEGATIVE Final    Comment: (NOTE) SARS-CoV-2 target nucleic acids are NOT DETECTED.  The SARS-CoV-2 RNA is generally detectable in upper respiratory specimens during the acute phase of infection. The lowest concentration of SARS-CoV-2 viral copies this assay can detect is 138 copies/mL. A negative result does not preclude SARS-Cov-2 infection and should not be used as the sole basis for treatment or other patient management decisions. A negative result may occur with  improper specimen collection/handling, submission  of specimen other than nasopharyngeal swab, presence of viral mutation(s) within the areas targeted by this assay, and inadequate number of viral copies(<138 copies/mL). A negative result must be combined with clinical observations, patient history, and epidemiological information. The expected result is  Negative.  Fact Sheet for Patients:  BloggerCourse.com  Fact Sheet for Healthcare Providers:  SeriousBroker.it  This test is no t yet approved or cleared by the [place] FDA and  has been authorized for detection and/or diagnosis of SARS-CoV-2 by FDA under an Emergency Use Authorization (EUA). This EUA will remain  in effect (meaning this test can be used) for the duration of the COVID-19 declaration under Section 564(b)(1) of the Act, 21 U.S.C.section 360bbb-3(b)(1), unless the authorization is terminated  or revoked sooner.       Influenza A by PCR NEGATIVE NEGATIVE Final   Influenza B by PCR NEGATIVE NEGATIVE Final    Comment: (NOTE) The Xpert Xpress SARS-CoV-2/FLU/RSV plus assay is intended as an aid in the diagnosis of influenza from Nasopharyngeal swab specimens and should not be used as a sole basis for treatment. Nasal washings and aspirates are unacceptable for Xpert Xpress SARS-CoV-2/FLU/RSV testing.  Fact Sheet for Patients: BloggerCourse.com  Fact Sheet for Healthcare Providers: SeriousBroker.it  This test is not yet approved or cleared by the [place] FDA and has been authorized for detection and/or diagnosis of SARS-CoV-2 by FDA under an Emergency Use Authorization (EUA). This EUA will remain in effect (meaning this test can be used) for the duration of the COVID-19 declaration under Section 564(b)(1) of the Act, 21 U.S.C. section 360bbb-3(b)(1), unless the authorization is terminated or revoked.  Performed at [facility], [address] [place], [place] [address]   Blood Culture ID Panel (Reflexed)     Status: Abnormal   Collection Time: 07/28/21  9:16 AM  Result Value Ref Range Status   Enterococcus faecalis NOT DETECTED NOT DETECTED Final   Enterococcus Faecium NOT DETECTED NOT DETECTED Final   Listeria monocytogenes NOT DETECTED NOT  DETECTED Final   Staphylococcus species NOT DETECTED NOT DETECTED Final   Staphylococcus aureus (BCID) NOT DETECTED NOT DETECTED Final   Staphylococcus epidermidis NOT DETECTED NOT DETECTED Final   Staphylococcus lugdunensis NOT DETECTED NOT DETECTED Final   Streptococcus species NOT DETECTED NOT DETECTED Final   Streptococcus agalactiae NOT DETECTED NOT DETECTED Final   Streptococcus pneumoniae NOT DETECTED NOT DETECTED Final   Streptococcus pyogenes NOT DETECTED NOT DETECTED Final   A.calcoaceticus-baumannii NOT DETECTED NOT DETECTED Final   Bacteroides fragilis NOT DETECTED NOT DETECTED Final   Enterobacterales DETECTED (A) NOT DETECTED Final    Comment: Enterobacterales represent a large order of gram negative bacteria, not a single organism. CRITICAL RESULT CALLED TO, READ BACK BY AND VERIFIED WITH: JASON ROBBINS@0017  07/29/21 RH    Enterobacter cloacae complex NOT DETECTED NOT DETECTED Final   Escherichia coli DETECTED (A) NOT DETECTED Final    Comment: CRITICAL RESULT CALLED TO, READ BACK BY AND VERIFIED WITH: JASON ROBBINS@0017  07/29/21 RH    Klebsiella aerogenes NOT DETECTED NOT DETECTED Final   Klebsiella oxytoca NOT DETECTED NOT DETECTED Final   Klebsiella pneumoniae NOT DETECTED NOT DETECTED Final   Proteus species NOT DETECTED NOT DETECTED Final   Salmonella species NOT DETECTED NOT DETECTED Final   Serratia marcescens NOT DETECTED NOT DETECTED Final   Haemophilus influenzae NOT DETECTED NOT DETECTED Final   Neisseria meningitidis NOT DETECTED NOT DETECTED Final   Pseudomonas aeruginosa NOT DETECTED NOT DETECTED Final  Stenotrophomonas maltophilia NOT DETECTED NOT DETECTED Final   Candida albicans NOT DETECTED NOT DETECTED Final   Candida auris NOT DETECTED NOT DETECTED Final   Candida glabrata NOT DETECTED NOT DETECTED Final   Candida krusei NOT DETECTED NOT DETECTED Final   Candida parapsilosis NOT DETECTED NOT DETECTED Final   Candida tropicalis NOT DETECTED NOT  DETECTED Final   Cryptococcus neoformans/gattii NOT DETECTED NOT DETECTED Final   CTX-M ESBL NOT DETECTED NOT DETECTED Final   Carbapenem resistance IMP NOT DETECTED NOT DETECTED Final   Carbapenem resistance KPC NOT DETECTED NOT DETECTED Final   Carbapenem resistance NDM NOT DETECTED NOT DETECTED Final   Carbapenem resist OXA 48 LIKE NOT DETECTED NOT DETECTED Final   Carbapenem resistance VIM NOT DETECTED NOT DETECTED Final    Comment: Performed at [facility], [address] [place], [place] [address]     Labs: BNP (last 3 results) No results for input(s): BNP in the last 8760 hours. Basic Metabolic Panel: Recent Labs  Lab 07/28/21 0916 07/29/21 0425  NA 139 140  K 3.4* 4.4  CL 106 107  CO2 22 26  GLUCOSE 120* 107*  BUN 18 13  CREATININE 1.07* 0.96  CALCIUM 8.2* 8.6*   Liver Function Tests: Recent Labs  Lab 07/28/21 0916  AST 27  ALT 27  ALKPHOS 95  BILITOT 1.1  PROT 6.1*  ALBUMIN 3.0*   No results for input(s): LIPASE, AMYLASE in the last 168 hours. No results for input(s): AMMONIA in the last 168 hours. CBC: Recent Labs  Lab 07/28/21 0916 07/29/21 0425  WBC 11.2* 8.9  NEUTROABS 9.8*  --   HGB 12.4 12.6  HCT 36.5 37.8  MCV 89.0 89.8  PLT 200 197   Cardiac Enzymes: No results for input(s): CKTOTAL, CKMB, CKMBINDEX, TROPONINI in the last 168 hours. BNP: Invalid input(s): POCBNP CBG: No results for input(s): GLUCAP in the last 168 hours. D-Dimer No results for input(s): DDIMER in the last 72 hours. Hgb A1c No results for input(s): HGBA1C in the last 72 hours. Lipid Profile No results for input(s): CHOL, HDL, LDLCALC, TRIG, CHOLHDL, LDLDIRECT in the last 72 hours. Thyroid function studies No results for input(s): TSH, T4TOTAL, T3FREE, THYROIDAB in the last 72 hours.  Invalid input(s): FREET3 Anemia work up No results for input(s): VITAMINB12, FOLATE, FERRITIN, TIBC, IRON, RETICCTPCT in the last 72 hours. Urinalysis    Component Value  Date/Time   COLORURINE AMBER (A) 07/28/2021 0916   APPEARANCEUR CLOUDY (A) 07/28/2021 0916   APPEARANCEUR Cloudy (A) 06/30/2021 1020   LABSPEC 1.015 07/28/2021 0916   PHURINE 6.0 07/28/2021 0916   GLUCOSEU NEGATIVE 07/28/2021 0916   HGBUR SMALL (A) 07/28/2021 0916   BILIRUBINUR NEGATIVE 07/28/2021 0916   BILIRUBINUR Negative 06/30/2021 1020   KETONESUR NEGATIVE 07/28/2021 0916   PROTEINUR 100 (A) 07/28/2021 0916   NITRITE NEGATIVE 07/28/2021 0916   LEUKOCYTESUR LARGE (A) 07/28/2021 0916   Sepsis Labs Invalid input(s): PROCALCITONIN,  WBC,  LACTICIDVEN Microbiology Recent Results (from the past 240 hour(s))  Blood culture (routine single)     Status: Abnormal (Preliminary result)   Collection Time: 07/28/21  9:16 AM   Specimen: BLOOD  Result Value Ref Range Status   Specimen Description   Final    BLOOD LEFT ANTECUBITAL Performed at [facility], [address], [place], [place] [address]    Special Requests   Final    BOTTLES DRAWN AEROBIC AND ANAEROBIC Blood Culture adequate volume Performed at [facility], [address] [place], [place] [address]    Culture  Setup Time   Final    GRAM NEGATIVE RODS ANAEROBIC BOTTLE ONLY Organism ID to follow CRITICAL RESULT CALLED TO, READ BACK BY AND VERIFIED WITH: JASON ROBBINS@0017  07/29/21 RH Performed at [facility], [address] [place], [place] [address]    Culture (A)  Final    ESCHERICHIA COLI SUSCEPTIBILITIES TO FOLLOW Performed at [facility], [address], [place], [place] [address]    Report Status PENDING  Incomplete  Urine Culture     Status: Abnormal   Collection Time: 07/28/21  9:16 AM   Specimen: Urine, Catheterized  Result Value Ref Range Status   Specimen Description   Final    URINE, CATHETERIZED Performed at [facility], [address] [place], [place] [address]    Special Requests   Final    NONE Performed at [facility], [address] [place], [place] [address]    Culture >=100,000 COLONIES/mL ESCHERICHIA COLI (A)  Final   Report Status 07/30/2021 FINAL  Final   Organism ID, Bacteria ESCHERICHIA COLI (A)  Final      Susceptibility   Escherichia coli - MIC*    AMPICILLIN >=32 RESISTANT Resistant     CEFAZOLIN 16 SENSITIVE Sensitive     CEFEPIME <=0.12 SENSITIVE Sensitive     CEFTRIAXONE <=0.25 SENSITIVE Sensitive     CIPROFLOXACIN >=4 RESISTANT Resistant     GENTAMICIN <=1 SENSITIVE Sensitive     IMIPENEM <=0.25 SENSITIVE Sensitive     NITROFURANTOIN <=16 SENSITIVE Sensitive     TRIMETH/SULFA <=20 SENSITIVE Sensitive     AMPICILLIN/SULBACTAM >=32 RESISTANT Resistant     PIP/TAZO <=4 SENSITIVE Sensitive     * >=100,000 COLONIES/mL ESCHERICHIA COLI  Blood culture (single)     Status: None (Preliminary result)   Collection Time: 07/28/21  9:16 AM   Specimen: BLOOD  Result Value Ref Range Status   Specimen Description BLOOD BLOOD LEFT WRIST  Final   Special Requests   Final    BOTTLES DRAWN AEROBIC AND ANAEROBIC Blood Culture adequate volume   Culture   Final    NO GROWTH 3 DAYS Performed at [facility], [address], [place], [place] [address]    Report Status PENDING  Incomplete  Resp Panel by RT-PCR (Flu A&B, Covid) Nasopharyngeal Swab     Status: None   Collection Time: 07/28/21  9:16 AM   Specimen: Nasopharyngeal Swab; Nasopharyngeal(NP) swabs in vial transport medium  Result Value Ref Range Status   SARS Coronavirus 2 by RT PCR NEGATIVE NEGATIVE Final    Comment: (NOTE) SARS-CoV-2 target nucleic acids are NOT DETECTED.  The SARS-CoV-2 RNA is generally detectable in upper respiratory specimens during the acute phase of infection. The lowest concentration of SARS-CoV-2 viral copies this assay can detect is 138 copies/mL. A negative result does not preclude SARS-Cov-2 infection and should not be used as the sole basis for treatment or other patient management decisions. A negative result may  occur with  improper specimen collection/handling, submission of specimen other than nasopharyngeal swab, presence of viral mutation(s) within the areas targeted by this assay, and inadequate number of viral copies(<138 copies/mL). A negative result must be combined with clinical observations, patient history, and epidemiological information. The expected result is Negative.  Fact Sheet for Patients:  BloggerCourse.com  Fact Sheet for Healthcare Providers:  SeriousBroker.it  This test is no t yet approved or cleared by the [place]  FDA and  has been authorized for detection and/or diagnosis of SARS-CoV-2 by FDA under an Emergency Use Authorization (EUA). This EUA will remain  in effect (meaning this test can be used) for the duration of the COVID-19 declaration under Section 564(b)(1) of the Act, 21 U.S.C.section 360bbb-3(b)(1), unless the authorization is terminated  or revoked sooner.       Influenza A by PCR NEGATIVE NEGATIVE Final   Influenza B by PCR NEGATIVE NEGATIVE Final    Comment: (NOTE) The Xpert Xpress SARS-CoV-2/FLU/RSV plus assay is intended as an aid in the diagnosis of influenza from Nasopharyngeal swab specimens and should not be used as a sole basis for treatment. Nasal washings and aspirates are unacceptable for Xpert Xpress SARS-CoV-2/FLU/RSV testing.  Fact Sheet for Patients: BloggerCourse.comhttps://www.fda.gov/media/152166/download  Fact Sheet for Healthcare Providers: SeriousBroker.ithttps://www.fda.gov/media/152162/download  This test is not yet approved or cleared by the [place] FDA and has been authorized for detection and/or diagnosis of SARS-CoV-2 by FDA under an Emergency Use Authorization (EUA). This EUA will remain in effect (meaning this test can be used) for the duration of the COVID-19 declaration under Section 564(b)(1) of the Act, 21 U.S.C. section 360bbb-3(b)(1), unless the authorization is terminated  or revoked.  Performed at [facility], [address] [place], [place] [id number]   Blood Culture ID Panel (Reflexed)     Status: Abnormal   Collection Time: 07/28/21  9:16 AM  Result Value Ref Range Status   Enterococcus faecalis NOT DETECTED NOT DETECTED Final   Enterococcus Faecium NOT DETECTED NOT DETECTED Final   Listeria monocytogenes NOT DETECTED NOT DETECTED Final   Staphylococcus species NOT DETECTED NOT DETECTED Final   Staphylococcus aureus (BCID) NOT DETECTED NOT DETECTED Final   Staphylococcus epidermidis NOT DETECTED NOT DETECTED Final   Staphylococcus lugdunensis NOT DETECTED NOT DETECTED Final   Streptococcus species NOT DETECTED NOT DETECTED Final   Streptococcus agalactiae NOT DETECTED NOT DETECTED Final   Streptococcus pneumoniae NOT DETECTED NOT DETECTED Final   Streptococcus pyogenes NOT DETECTED NOT DETECTED Final   A.calcoaceticus-baumannii NOT DETECTED NOT DETECTED Final   Bacteroides fragilis NOT DETECTED NOT DETECTED Final   Enterobacterales DETECTED (A) NOT DETECTED Final    Comment: Enterobacterales represent a large order of gram negative bacteria, not a single organism. CRITICAL RESULT CALLED TO, READ BACK BY AND VERIFIED WITH: JASON ROBBINS@0017  07/29/21 RH    Enterobacter cloacae complex NOT DETECTED NOT DETECTED Final   Escherichia coli DETECTED (A) NOT DETECTED Final    Comment: CRITICAL RESULT CALLED TO, READ BACK BY AND VERIFIED WITH: JASON ROBBINS@0017  07/29/21 RH    Klebsiella aerogenes NOT DETECTED NOT DETECTED Final   Klebsiella oxytoca NOT DETECTED NOT DETECTED Final   Klebsiella pneumoniae NOT DETECTED NOT DETECTED Final   Proteus species NOT DETECTED NOT DETECTED Final   Salmonella species NOT DETECTED NOT DETECTED Final   Serratia marcescens NOT DETECTED NOT DETECTED Final   Haemophilus influenzae NOT DETECTED NOT DETECTED Final   Neisseria meningitidis NOT DETECTED NOT DETECTED Final   Pseudomonas aeruginosa NOT DETECTED NOT  DETECTED Final   Stenotrophomonas maltophilia NOT DETECTED NOT DETECTED Final   Candida albicans NOT DETECTED NOT DETECTED Final   Candida auris NOT DETECTED NOT DETECTED Final   Candida glabrata NOT DETECTED NOT DETECTED Final   Candida krusei NOT DETECTED NOT DETECTED Final   Candida parapsilosis NOT DETECTED NOT DETECTED Final   Candida tropicalis NOT DETECTED NOT DETECTED Final   Cryptococcus neoformans/gattii NOT DETECTED NOT DETECTED Final  CTX-M ESBL NOT DETECTED NOT DETECTED Final   Carbapenem resistance IMP NOT DETECTED NOT DETECTED Final   Carbapenem resistance KPC NOT DETECTED NOT DETECTED Final   Carbapenem resistance NDM NOT DETECTED NOT DETECTED Final   Carbapenem resist OXA 48 LIKE NOT DETECTED NOT DETECTED Final   Carbapenem resistance VIM NOT DETECTED NOT DETECTED Final    Comment: Performed at [facility], [address], [place], [place] [address]     Time coordinating discharge: 25 minutes [facility] controlled substances registry was reviewed for this patient    30 Day Unplanned Readmission Risk Score    Flowsheet Row ED to Hosp-Admission (Current) from 07/28/2021 in [facility] ON[facility] (1C)  30 Day Unplanned Readmission Risk Score (%) 12.68 Filed at 07/31/2021 0800       This score is the patient's risk of an unplanned readmission within 30 days of being discharged (0 -100%). The score is based on dignosis, age, lab data, medications, orders, and past utilization.   Low:  0-14.9   Medium: 15-21.9   High: 22-29.9   Extreme: 30 and above            SIGNED:   Alberteen Sam, MD  [facility] 07/31/2021, 8:10 AM

## 2021-08-02 LAB — CULTURE, BLOOD (SINGLE)
Culture: NO GROWTH
Special Requests: ADEQUATE

## 2021-08-24 NOTE — Progress Notes (Signed)
Cath Change/ Replacement  Patient is present today for a catheter change due to urinary retention.  9 ml of water was removed from the balloon, a 14 FR foley cath was removed with out difficulty.  Patient was cleaned and prepped in a sterile fashion with betadine. A 16 FR Silicone foley cath was replaced into the bladder no complications were noted Urine return was noted 30 ml and urine was yellow in color. The balloon was filled with 1ml of sterile water. A leg bag was attached for drainage.  A night bag was also given to the patient and patient was given instruction on how to change from one bag to another. Patient was given proper instruction on catheter care.    Performed by: Michiel Cowboy, PA-C  Follow up: One month for RUS, cysto and Foley catheter exchange with Dr. Apolinar Junes

## 2021-08-25 ENCOUNTER — Other Ambulatory Visit: Payer: Self-pay

## 2021-08-25 ENCOUNTER — Ambulatory Visit: Payer: Medicare Other | Admitting: Urology

## 2021-08-25 DIAGNOSIS — N390 Urinary tract infection, site not specified: Secondary | ICD-10-CM

## 2021-08-25 DIAGNOSIS — Z978 Presence of other specified devices: Secondary | ICD-10-CM

## 2021-09-06 ENCOUNTER — Ambulatory Visit: Payer: Medicare Other | Admitting: Urology

## 2021-09-06 ENCOUNTER — Telehealth: Payer: Self-pay | Admitting: Urology

## 2021-09-06 NOTE — Telephone Encounter (Signed)
Spoke with patient and her SO, [name].  The catheter came out last night.  She slept with depends and pads on the bed and they were soaked when she woke up.   They left a voicemail this morning and were offered a 10 AM appointment to have a catheter inserted.  They stated that the 10 AM appointment would not work for them as they have a nurse coming out to the house and I asked them to inquire with the nurse to see if the nurse was able to put in a Foley catheter.  We did not hear back from the patient until they presented to the office at 1045 stating the nurse that came out to the house was a physical therapist and could not place a Foley catheter.  The procedure room was currently in use and we needed it for a low bed, but the room would be available by 1130.  They then left the office and we assume they would come back 1130 to have a catheter placed.  When they did not return by 1145, I made inquiries to see if they had contacted the office.  Derek had left a voicemail stating he was able to put the catheter back in.  I called and spoke with them and they stated that they cleansed the catheter with hydrogen peroxide and alcohol prior to reinserting.  He had a syringe available and bottles of sterile water which he used to reinflate the balloon.  I explained to him that this was not the best scenario as it would have been preferable for them to put in a brand-new catheter and that putting in a used catheter would increase her risk for UTIs.  I advised them to watch her closely now for any signs of UTI and to either contact us immediately or seek treatment in the ED if she should have the symptoms.

## 2021-09-06 NOTE — Telephone Encounter (Signed)
Pt LMOM stating her catheter had come out and wanted to be seen this morning.  Pt has home health nurse coming at 10:00 this morning.  Pt was offered an appt in [place] at 10.  Carollee Herter said the nurse should be able to put catheter back in.

## 2021-09-18 IMAGING — MR MR LUMBAR SPINE W/O CM
5 series · 30 of 48 positions shown · non-contrast
Comparison: Plain films lumbar spine 07/05/2013.

CLINICAL DATA: Low back pain radiating into the right buttock and
leg. No known injury.

EXAM:
MRI LUMBAR SPINE WITHOUT CONTRAST
TECHNIQUE: Multiplanar, multisequence MR imaging of the lumbar spine was
performed. No intravenous contrast was administered.

[Series 5: T2 · sagittal · 4.0mm · 0.81mm/px · 6 of 17 slices shown (1 of 2)]
[im 1/17]
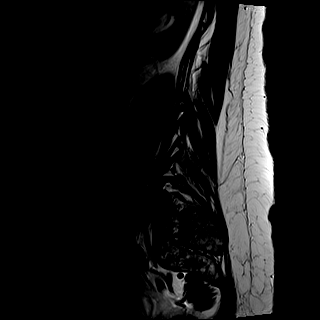
[im 4/17]
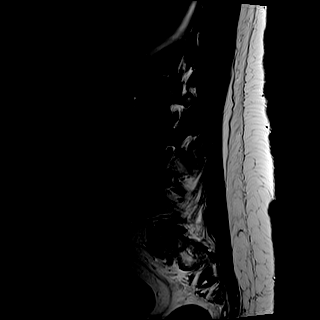
[im 7/17]
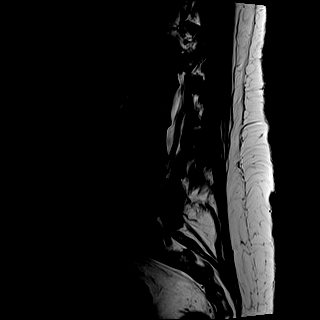
[im 10/17]
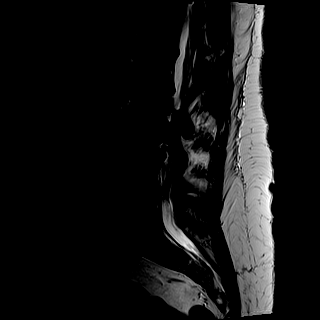
[im 13/17]
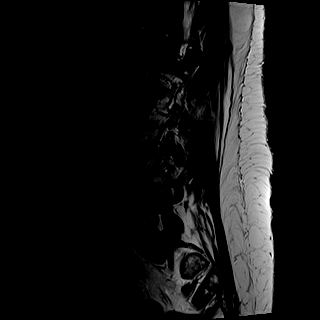
[im 17/17]
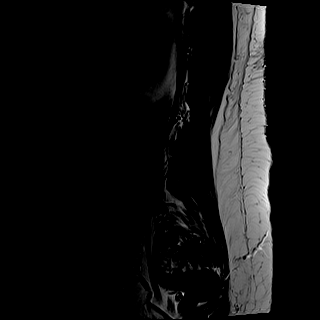

[Series 6: T1 · sagittal · 4.0mm · 0.81mm/px · 7 of 17 slices shown (1 of 2)]
[im 1/17]
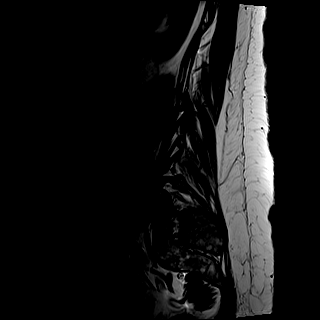
[im 3/17]
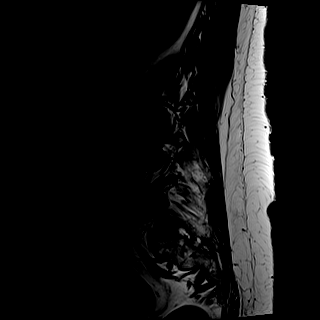
[im 6/17]
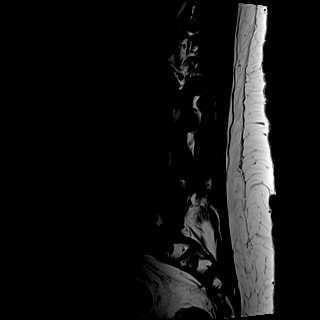
[im 9/17]
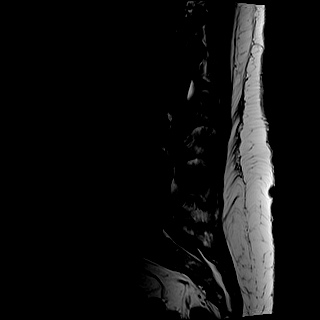
[im 11/17]
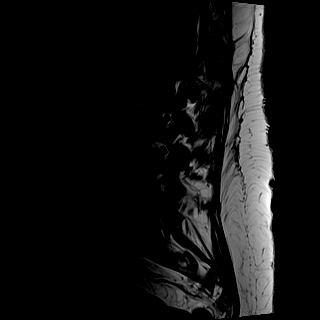
[im 14/17]
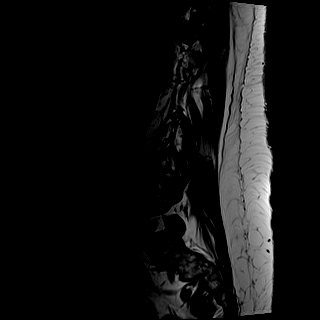
[im 17/17]
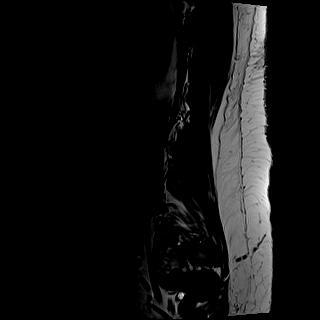

[Series 7: STIR · sagittal · 4.0mm · 0.41mm/px · 1 of 17 slices shown]
[im 1/17]
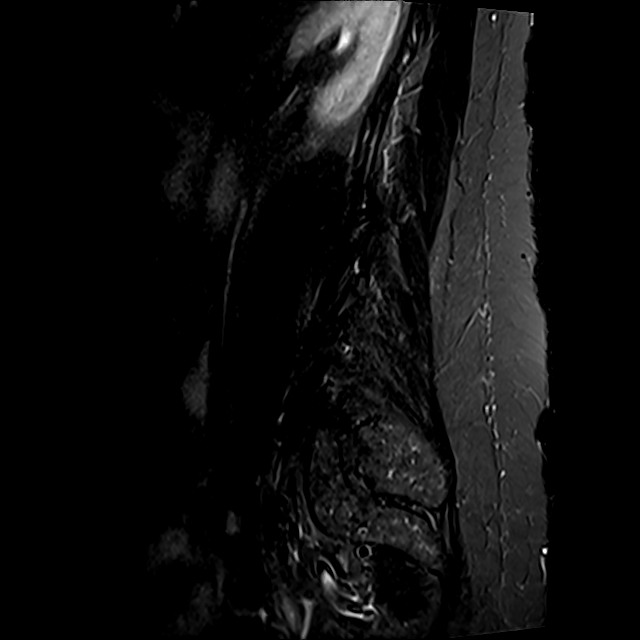

[Series 8: T2 · axial · 4.0mm · 0.78mm/px · z∈[-193,+4]mm · 8 of 36 slices shown (2 of 2)]
[im 1/36]
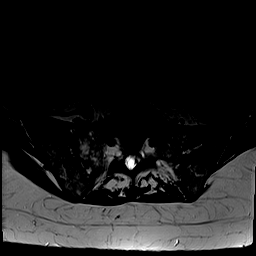
[im 6/36]
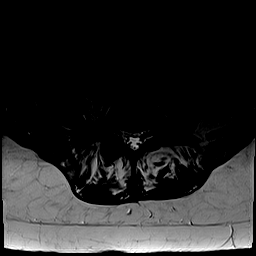
[im 11/36]
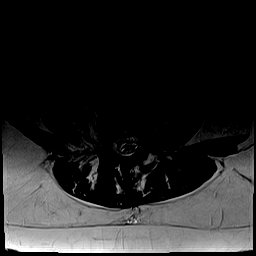
[im 17/36]
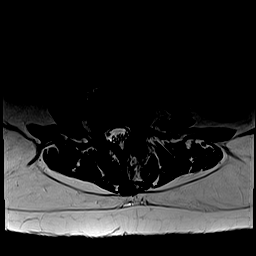
[im 19/36]
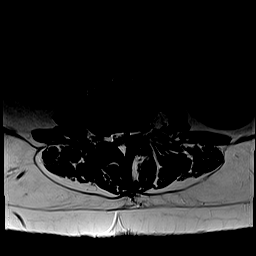
[im 25/36]
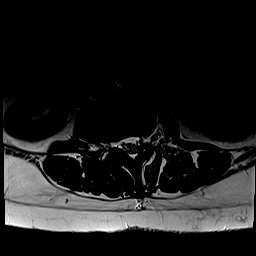
[im 30/36]
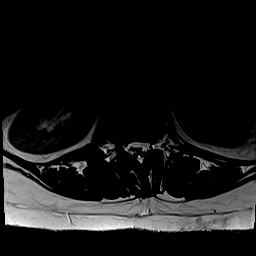
[im 36/36]
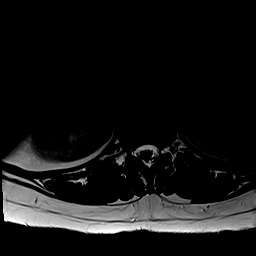

[Series 9: T1 · axial · 4.0mm · 0.39mm/px · z∈[-193,+4]mm · 8 of 36 slices shown (2 of 2)]
[im 1/36]
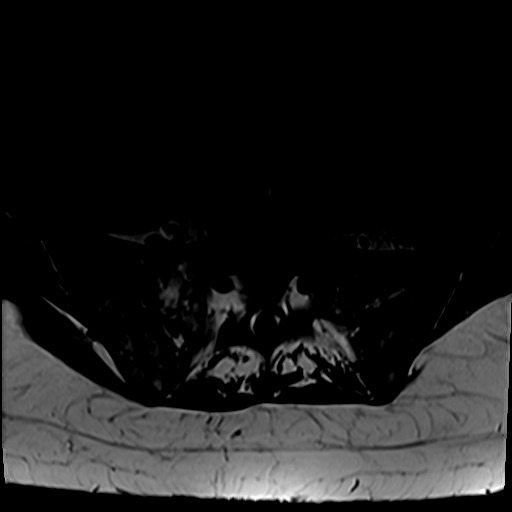
[im 6/36]
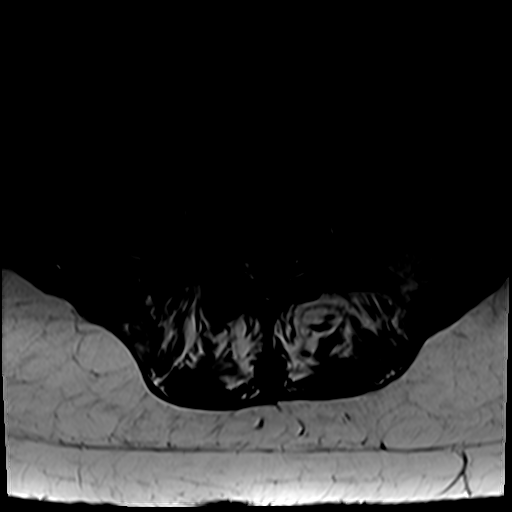
[im 11/36]
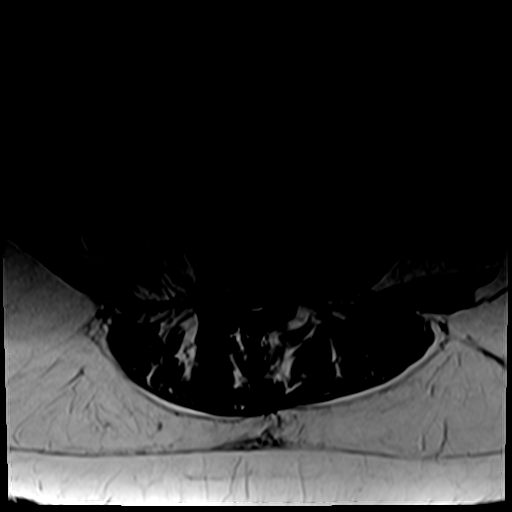
[im 17/36]
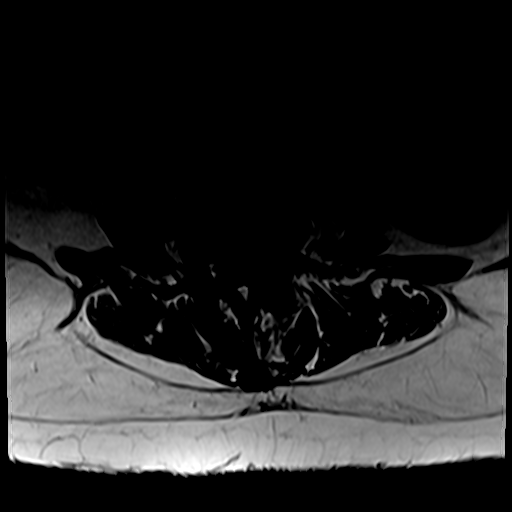
[im 19/36]
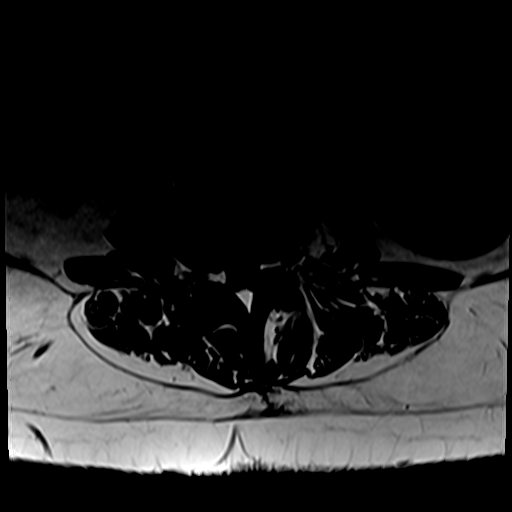
[im 25/36]
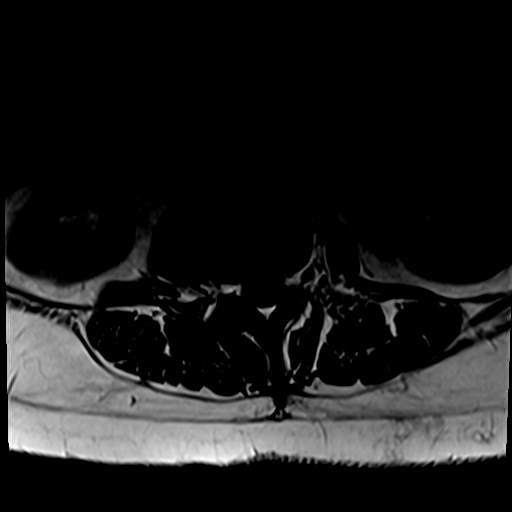
[im 30/36]
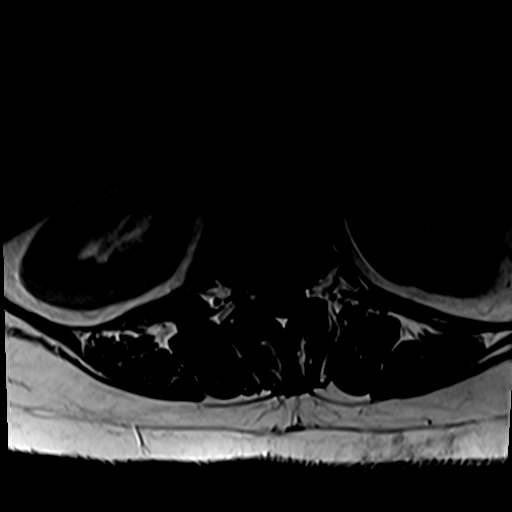
[im 36/36]
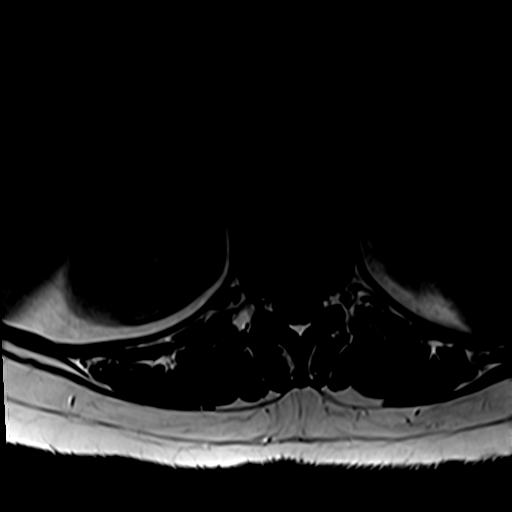

[30 of 48 positions shown; findings below may reference images not displayed]

FINDINGS: Segmentation: Standard. Rudimentary ribs off the lowest thoracic
vertebral body noted.

Alignment: No listhesis. Convex right scoliosis with the apex at
approximately L3-4 is new since the prior plain films.

Vertebrae: No fracture, evidence of discitis, or bone lesion.
Degenerative endplate signal change is worst at L2-3, L3-4 and L4-5
eccentric to the right.

Conus medullaris and cauda equina: Conus extends to the T12-L1
level. Conus and cauda equina appear normal.

Paraspinal and other soft tissues: Negative.

Disc levels:

T11-12: Minimal disc bulge and mild-to-moderate facet degenerative
disease. No stenosis.

T12-L1: Shallow left paracentral protrusion without stenosis.

L1-2: There is loss of disc space height and a shallow bulge. Mild
facet degenerative disease. No stenosis.

L2-3: Loss of disc space height, mild-to-moderate facet arthropathy
and prominent dorsal epidural fat. There is mild narrowing in the
left lateral recess and foramen. The right foramen is open. No nerve
root compression is seen.

L3-4: Mild-to-moderate facet arthropathy, disc bulge and prominent
dorsal epidural fat. There is moderate compression of the thecal sac
by disc and epidural fat. The right foramen is open. Mild left
foraminal narrowing noted.

L4-5: Loss of disc space height. Right worse than left facet
degenerative disease with secondary marrow edema in the right facets
and pedicles. Spondylosis results in marked right foraminal
narrowing with encroachment on the descending exiting right L4 root
and marked narrowing the right subarticular recess with encroachment
on the descending right L5 root. The left foramen is open.

L5-S1: Mild-to-moderate facet degenerative change and a shallow disc
bulge without stenosis.
IMPRESSION: Convex right scoliosis is new since 6682.

Degenerative disease is worst at L4-5 where there is marked
narrowing in the right subarticular recess and foramen with
encroachment on the exiting right L4 and descending right L5 roots.

Disc and prominent dorsal epidural fat cause moderate compression of
the thecal sac at L3-4.

Mild narrowing in the left lateral recess and foramen at L2-3
without nerve root compression.

## 2021-09-27 ENCOUNTER — Ambulatory Visit: Payer: Medicare Other | Admitting: Urology

## 2021-09-27 ENCOUNTER — Other Ambulatory Visit: Payer: Self-pay

## 2021-09-27 ENCOUNTER — Ambulatory Visit
Admission: RE | Admit: 2021-09-27 | Discharge: 2021-09-27 | Disposition: A | Discharge status: Home / Self Care | Payer: Medicare Other | Source: Ambulatory Visit | Attending: Urology | Admitting: Urology

## 2021-09-27 DIAGNOSIS — N390 Urinary tract infection, site not specified: Secondary | ICD-10-CM | POA: Insufficient documentation

## 2021-09-28 NOTE — Progress Notes (Signed)
   09/29/21  CC: neurogenic bladder    HPI: Adrienne Strickland is a 60 y.o.female who has a personal history of recurrent UTIs and neurogenic bladder that is managed on SPT who presents today for cystoscopy, cath exchange, and RUS results.   Her last urine culture on 07/28/2021 grew E.coli.   RUS on 09/27/2021 revealed unremarkable appearance of the kidneys.   Today a urethral Foley was removed.   She is doing well today with no new urinary symptoms.   No longer has SPT  Vitals:   09/29/21 0937  BP: 120/73  Pulse: 65  NED. A&Ox3.   No respiratory distress   Abd soft, NT, ND Normal external genitalia with patent urethral meatus  Cystoscopy Procedure Note  Patient identification was confirmed, informed consent was obtained, and patient was prepped using Betadine solution.  Lidocaine jelly was administered per urethral meatus.    Procedure: - Flexible cystoscope introduced, without any difficulty.   - Thorough search of the bladder revealed:   Catheter edema on bladder wall     normal urethral meatus    Mild erythema of urothelium    no stones    no ulcers     no tumors    no urethral polyps    Mild  trabeculation    Minimal debris   - Ureteral orifices were normal in position and appearance.  Post-Procedure: - Patient tolerated the procedure well   Assessment/ Plan:  Recurrent UTIs  - Cystoscopy unremarkable today (other than changes related to chronic foley) and RUS was unremarkable as well  - Transition cystoscopy to every other year or sooner if needed   2. Neurogenic bladder  Elected to maintain urethral foley Cotninue q1 month cath exchanges    [employer] as a scribe for Vanna Scotland, MD.,have documented all relevant documentation on the behalf of Vanna Scotland, MD,as directed by  Vanna Scotland, MD while in the presence of Vanna Scotland, MD.  I have reviewed the above documentation for accuracy and completeness, and I agree with the  above.   Vanna Scotland, MD

## 2021-09-29 ENCOUNTER — Encounter: Payer: Self-pay | Admitting: Urology

## 2021-09-29 ENCOUNTER — Other Ambulatory Visit: Payer: Self-pay

## 2021-09-29 ENCOUNTER — Ambulatory Visit: Payer: Medicare Other | Admitting: Urology

## 2021-09-29 VITALS — BP 120/73 | HR 65

## 2021-09-29 DIAGNOSIS — N390 Urinary tract infection, site not specified: Secondary | ICD-10-CM

## 2021-09-29 DIAGNOSIS — N319 Neuromuscular dysfunction of bladder, unspecified: Secondary | ICD-10-CM | POA: Diagnosis not present

## 2021-09-29 DIAGNOSIS — Z978 Presence of other specified devices: Secondary | ICD-10-CM

## 2021-10-29 NOTE — Progress Notes (Signed)
Cath Change/ Replacement  Patient is present today for a catheter change due to urinary retention.  10 ml of water was removed from the balloon, a 16 FR Silicone foley cath was removed with out difficulty.  Patient was cleaned and prepped in a sterile fashion with betadine. A 16 FR silicone foley cath was replaced into the bladder no complications were noted Urine return was noted 40 ml and urine was yellow clear in color. The balloon was filled with 4ml of sterile water. A leg bag was attached for drainage.  A night bag was also given to the patient and patient was given instruction on how to change from one bag to another. Patient was given proper instruction on catheter care.    Performed by: Michiel Cowboy, PA-C  Follow up: One month for Foley exchange and  09/2022 for RUS and cysto with Dr. Apolinar Junes

## 2021-11-01 ENCOUNTER — Other Ambulatory Visit: Payer: Self-pay

## 2021-11-01 ENCOUNTER — Ambulatory Visit: Payer: Medicare Other | Admitting: Urology

## 2021-11-01 ENCOUNTER — Encounter: Payer: Self-pay | Admitting: Urology

## 2021-11-01 VITALS — BP 112/76 | HR 85 | Ht 65.0 in | Wt 200.0 lb

## 2021-11-01 DIAGNOSIS — Z978 Presence of other specified devices: Secondary | ICD-10-CM | POA: Diagnosis not present

## 2021-11-02 ENCOUNTER — Ambulatory Visit: Payer: Medicare Other | Admitting: Urology

## 2021-11-28 NOTE — Progress Notes (Signed)
Cath Change/ Replacement  Patient is present today for a catheter change due to urinary retention.  20 ml of water was removed from the balloon, a 16 FR silicone foley cath was removed with out difficulty.  Patient was cleaned and prepped in a sterile fashion with betadine. A 16 FR silicone foley cath was replaced into the bladder no complications were noted Urine return was noted 30 ml and urine was yellow clear in color. The balloon was filled with 5ml of sterile water. A leg bag was attached for drainage.  A night bag was also given to the patient and patient was given instruction on how to change from one bag to another. Patient was given proper instruction on catheter care.    Performed by: Michiel Cowboy, PA-C   Follow up: One month for Foley exchange and 09/2022 for RUS and cysto with Dr. Apolinar Junes

## 2021-11-29 ENCOUNTER — Encounter: Payer: Self-pay | Admitting: Urology

## 2021-11-29 ENCOUNTER — Ambulatory Visit: Payer: Medicare Other | Admitting: Urology

## 2021-11-29 ENCOUNTER — Other Ambulatory Visit: Payer: Self-pay

## 2021-11-29 VITALS — BP 144/77 | HR 69 | Ht 65.0 in | Wt 200.0 lb

## 2021-11-29 DIAGNOSIS — Z978 Presence of other specified devices: Secondary | ICD-10-CM

## 2021-12-06 ENCOUNTER — Other Ambulatory Visit: Payer: Self-pay | Admitting: Neurology

## 2021-12-06 DIAGNOSIS — G35 Multiple sclerosis: Secondary | ICD-10-CM

## 2021-12-21 ENCOUNTER — Ambulatory Visit
Admission: RE | Admit: 2021-12-21 | Discharge: 2021-12-21 | Disposition: A | Discharge status: Home / Self Care | Payer: Medicare Other | Source: Ambulatory Visit | Attending: Neurology | Admitting: Neurology

## 2021-12-21 ENCOUNTER — Other Ambulatory Visit: Payer: Self-pay

## 2021-12-21 DIAGNOSIS — G35 Multiple sclerosis: Secondary | ICD-10-CM | POA: Diagnosis present

## 2021-12-21 MED ORDER — GADOBUTROL 1 MMOL/ML IV SOLN
9.0000 mL | Freq: Once | INTRAVENOUS | Status: AC | PRN
  Administered 2021-12-21: 16:00:00 9 mL via INTRAVENOUS

## 2022-01-02 NOTE — Progress Notes (Signed)
Cath Change/ Replacement  Patient is present today for a catheter change due to urinary retention.  15 ml of water was removed from the balloon, a 16 FR silicone foley cath was removed with out difficulty.  Patient was cleaned and prepped in a sterile fashion with betadine. A 16 FR silicone foley cath was replaced into the bladder no complications were noted Urine return was noted 20 ml and urine was yellow in color. The balloon was filled with 6ml of sterile water. A leg bag was attached for drainage.  A night bag was also given to the patient and patient was given instruction on how to change from one bag to another. Patient was given proper instruction on catheter care.    Performed by: Michiel Cowboy, PA-C   Follow up: One month for Foley exchange and 09/2022 for RUS and cysto with Dr. Apolinar Junes

## 2022-01-03 ENCOUNTER — Ambulatory Visit: Payer: Medicare Other | Admitting: Urology

## 2022-01-03 ENCOUNTER — Encounter: Payer: Self-pay | Admitting: Urology

## 2022-01-03 ENCOUNTER — Other Ambulatory Visit: Payer: Self-pay

## 2022-01-03 VITALS — BP 138/78 | HR 67 | Ht 65.0 in | Wt 200.0 lb

## 2022-01-03 DIAGNOSIS — Z978 Presence of other specified devices: Secondary | ICD-10-CM

## 2022-01-28 ENCOUNTER — Encounter: Payer: Self-pay | Admitting: Internal Medicine

## 2022-01-28 ENCOUNTER — Other Ambulatory Visit: Payer: Self-pay

## 2022-01-28 ENCOUNTER — Inpatient Hospital Stay
Admission: EM | Admit: 2022-01-28 | Discharge: 2022-01-30 | DRG: 871 | Disposition: A | Discharge status: Home / Self Care | Payer: Medicare Other | Attending: Hospitalist | Admitting: Hospitalist

## 2022-01-28 ENCOUNTER — Emergency Department: Payer: Medicare Other

## 2022-01-28 DIAGNOSIS — R4189 Other symptoms and signs involving cognitive functions and awareness: Secondary | ICD-10-CM | POA: Diagnosis present

## 2022-01-28 DIAGNOSIS — Z79899 Other long term (current) drug therapy: Secondary | ICD-10-CM

## 2022-01-28 DIAGNOSIS — N179 Acute kidney failure, unspecified: Secondary | ICD-10-CM | POA: Diagnosis present

## 2022-01-28 DIAGNOSIS — Z20822 Contact with and (suspected) exposure to covid-19: Secondary | ICD-10-CM | POA: Diagnosis present

## 2022-01-28 DIAGNOSIS — Z825 Family history of asthma and other chronic lower respiratory diseases: Secondary | ICD-10-CM | POA: Diagnosis not present

## 2022-01-28 DIAGNOSIS — R4182 Altered mental status, unspecified: Secondary | ICD-10-CM | POA: Diagnosis present

## 2022-01-28 DIAGNOSIS — U071 COVID-19: Secondary | ICD-10-CM | POA: Diagnosis present

## 2022-01-28 DIAGNOSIS — Z882 Allergy status to sulfonamides status: Secondary | ICD-10-CM

## 2022-01-28 DIAGNOSIS — A4151 Sepsis due to Escherichia coli [E. coli]: Secondary | ICD-10-CM | POA: Diagnosis present

## 2022-01-28 DIAGNOSIS — Z887 Allergy status to serum and vaccine status: Secondary | ICD-10-CM | POA: Diagnosis not present

## 2022-01-28 DIAGNOSIS — M5416 Radiculopathy, lumbar region: Secondary | ICD-10-CM | POA: Diagnosis present

## 2022-01-28 DIAGNOSIS — G894 Chronic pain syndrome: Secondary | ICD-10-CM | POA: Diagnosis present

## 2022-01-28 DIAGNOSIS — G35 Multiple sclerosis: Secondary | ICD-10-CM | POA: Diagnosis present

## 2022-01-28 DIAGNOSIS — D72829 Elevated white blood cell count, unspecified: Secondary | ICD-10-CM

## 2022-01-28 DIAGNOSIS — Z8249 Family history of ischemic heart disease and other diseases of the circulatory system: Secondary | ICD-10-CM | POA: Diagnosis not present

## 2022-01-28 DIAGNOSIS — N319 Neuromuscular dysfunction of bladder, unspecified: Secondary | ICD-10-CM | POA: Diagnosis present

## 2022-01-28 DIAGNOSIS — N390 Urinary tract infection, site not specified: Secondary | ICD-10-CM | POA: Diagnosis present

## 2022-01-28 DIAGNOSIS — A419 Sepsis, unspecified organism: Principal | ICD-10-CM

## 2022-01-28 DIAGNOSIS — M1990 Unspecified osteoarthritis, unspecified site: Secondary | ICD-10-CM | POA: Diagnosis present

## 2022-01-28 LAB — CBC WITH DIFFERENTIAL/PLATELET
Abs Immature Granulocytes: 0.25 10*3/uL — ABNORMAL HIGH (ref 0.00–0.07)
Basophils Absolute: 0.1 10*3/uL (ref 0.0–0.1)
Basophils Relative: 0 %
Eosinophils Absolute: 0 10*3/uL (ref 0.0–0.5)
Eosinophils Relative: 0 %
HCT: 45.9 % (ref 36.0–46.0)
Hemoglobin: 14.9 g/dL (ref 12.0–15.0)
Immature Granulocytes: 1 %
Lymphocytes Relative: 3 %
Lymphs Abs: 0.8 10*3/uL (ref 0.7–4.0)
MCH: 28.7 pg (ref 26.0–34.0)
MCHC: 32.5 g/dL (ref 30.0–36.0)
MCV: 88.3 fL (ref 80.0–100.0)
Monocytes Absolute: 1.2 10*3/uL — ABNORMAL HIGH (ref 0.1–1.0)
Monocytes Relative: 5 %
Neutro Abs: 22.5 10*3/uL — ABNORMAL HIGH (ref 1.7–7.7)
Neutrophils Relative %: 91 %
Platelets: 399 10*3/uL (ref 150–400)
RBC: 5.2 MIL/uL — ABNORMAL HIGH (ref 3.87–5.11)
RDW: 13.9 % (ref 11.5–15.5)
WBC: 24.8 10*3/uL — ABNORMAL HIGH (ref 4.0–10.5)
nRBC: 0 % (ref 0.0–0.2)

## 2022-01-28 LAB — COMPREHENSIVE METABOLIC PANEL
ALT: 18 U/L (ref 0–44)
AST: 22 U/L (ref 15–41)
Albumin: 3.9 g/dL (ref 3.5–5.0)
Alkaline Phosphatase: 110 U/L (ref 38–126)
Anion gap: 9 (ref 5–15)
BUN: 22 mg/dL — ABNORMAL HIGH (ref 6–20)
CO2: 24 mmol/L (ref 22–32)
Calcium: 9.4 mg/dL (ref 8.9–10.3)
Chloride: 104 mmol/L (ref 98–111)
Creatinine, Ser: 1.32 mg/dL — ABNORMAL HIGH (ref 0.44–1.00)
GFR, Estimated: 47 mL/min — ABNORMAL LOW (ref >60)
Glucose, Bld: 155 mg/dL — ABNORMAL HIGH (ref 70–99)
Potassium: 4.2 mmol/L (ref 3.5–5.1)
Sodium: 137 mmol/L (ref 135–145)
Total Bilirubin: 0.9 mg/dL (ref 0.3–1.2)
Total Protein: 7.5 g/dL (ref 6.5–8.1)

## 2022-01-28 LAB — CBC
HCT: 46.1 % — ABNORMAL HIGH (ref 36.0–46.0)
Hemoglobin: 14.9 g/dL (ref 12.0–15.0)
MCH: 28.3 pg (ref 26.0–34.0)
MCHC: 32.3 g/dL (ref 30.0–36.0)
MCV: 87.6 fL (ref 80.0–100.0)
Platelets: 358 10*3/uL (ref 150–400)
RBC: 5.26 MIL/uL — ABNORMAL HIGH (ref 3.87–5.11)
RDW: 13.8 % (ref 11.5–15.5)
WBC: 24.4 10*3/uL — ABNORMAL HIGH (ref 4.0–10.5)
nRBC: 0 % (ref 0.0–0.2)

## 2022-01-28 LAB — RESP PANEL BY RT-PCR (FLU A&B, COVID) ARPGX2
Influenza A by PCR: NEGATIVE
Influenza B by PCR: NEGATIVE
SARS Coronavirus 2 by RT PCR: POSITIVE — AB

## 2022-01-28 LAB — URINALYSIS, ROUTINE W REFLEX MICROSCOPIC
Bilirubin Urine: NEGATIVE
Glucose, UA: NEGATIVE mg/dL
Ketones, ur: NEGATIVE mg/dL
Nitrite: POSITIVE — AB
Protein, ur: >300 mg/dL — AB
Specific Gravity, Urine: 1.01 (ref 1.005–1.030)
pH: 6 (ref 5.0–8.0)

## 2022-01-28 LAB — URINALYSIS, MICROSCOPIC (REFLEX): WBC, UA: >50 WBC/hpf (ref 0–5)

## 2022-01-28 LAB — D-DIMER, QUANTITATIVE: D-Dimer, Quant: 1.22 ug/mL-FEU — ABNORMAL HIGH (ref 0.00–0.50)

## 2022-01-28 LAB — LACTIC ACID, PLASMA
Lactic Acid, Venous: 2 mmol/L (ref 0.5–1.9)
Lactic Acid, Venous: 1.8 mmol/L (ref 0.5–1.9)

## 2022-01-28 LAB — PROTIME-INR
INR: 1.1 (ref 0.8–1.2)
Prothrombin Time: 13.9 seconds (ref 11.4–15.2)

## 2022-01-28 LAB — C-REACTIVE PROTEIN: CRP: 15.4 mg/dL — ABNORMAL HIGH (ref <1.0)

## 2022-01-28 MED ORDER — LACTATED RINGERS IV BOLUS (SEPSIS)
1000.0000 mL | Freq: Once | INTRAVENOUS | Status: AC
  Administered 2022-01-28: 1000 mL via INTRAVENOUS

## 2022-01-28 MED ORDER — ALBUTEROL SULFATE HFA 108 (90 BASE) MCG/ACT IN AERS
2.0000 | INHALATION_SPRAY | Freq: Four times a day (QID) | RESPIRATORY_TRACT | Status: DC
  Administered 2022-01-28 – 2022-01-30 (×6): 2 via RESPIRATORY_TRACT
  Filled 2022-01-28: qty 6.7

## 2022-01-28 MED ORDER — SODIUM CHLORIDE 0.9 % IV SOLN
2.0000 g | Freq: Once | INTRAVENOUS | Status: AC
  Administered 2022-01-28: 2 g via INTRAVENOUS
  Filled 2022-01-28: qty 2

## 2022-01-28 MED ORDER — ENOXAPARIN SODIUM 60 MG/0.6ML IJ SOSY
0.5000 mg/kg | PREFILLED_SYRINGE | INTRAMUSCULAR | Status: DC
  Administered 2022-01-28 – 2022-01-29 (×2): 45 mg via SUBCUTANEOUS
  Filled 2022-01-28 (×2): qty 0.6

## 2022-01-28 MED ORDER — DONEPEZIL HCL 5 MG PO TABS
10.0000 mg | ORAL_TABLET | Freq: Two times a day (BID) | ORAL | Status: DC
  Administered 2022-01-28 – 2022-01-30 (×5): 10 mg via ORAL
  Filled 2022-01-28 (×5): qty 2

## 2022-01-28 MED ORDER — VITAMIN D 25 MCG (1000 UNIT) PO TABS
1000.0000 [IU] | ORAL_TABLET | Freq: Every day | ORAL | Status: DC
  Administered 2022-01-28 – 2022-01-30 (×3): 1000 [IU] via ORAL
  Filled 2022-01-28 (×3): qty 1

## 2022-01-28 MED ORDER — ENOXAPARIN SODIUM 40 MG/0.4ML IJ SOSY: 40.0000 mg | PREFILLED_SYRINGE | INTRAMUSCULAR | Status: DC

## 2022-01-28 MED ORDER — FLUTICASONE PROPIONATE 50 MCG/ACT NA SUSP
2.0000 | Freq: Every day | NASAL | Status: DC
  Administered 2022-01-28 – 2022-01-30 (×3): 2 via NASAL
  Filled 2022-01-28: qty 16

## 2022-01-28 MED ORDER — VITAMIN B-12 1000 MCG PO TABS
1000.0000 ug | ORAL_TABLET | Freq: Every day | ORAL | Status: DC
  Administered 2022-01-29 – 2022-01-30 (×2): 1000 ug via ORAL
  Filled 2022-01-28 (×2): qty 1

## 2022-01-28 MED ORDER — LACTATED RINGERS IV SOLN: INTRAVENOUS | Status: DC

## 2022-01-28 MED ORDER — ZINC SULFATE 220 (50 ZN) MG PO CAPS
220.0000 mg | ORAL_CAPSULE | Freq: Every day | ORAL | Status: DC
  Administered 2022-01-28 – 2022-01-30 (×3): 220 mg via ORAL
  Filled 2022-01-28 (×3): qty 1

## 2022-01-28 MED ORDER — CHLORHEXIDINE GLUCONATE CLOTH 2 % EX PADS
6.0000 | MEDICATED_PAD | Freq: Every day | CUTANEOUS | Status: DC
  Administered 2022-01-29 – 2022-01-30 (×2): 6 via TOPICAL

## 2022-01-28 MED ORDER — GUAIFENESIN-DM 100-10 MG/5ML PO SYRP
10.0000 mL | ORAL_SOLUTION | ORAL | Status: DC | PRN
  Filled 2022-01-28: qty 10

## 2022-01-28 MED ORDER — ACETAMINOPHEN 650 MG RE SUPP: 650.0000 mg | Freq: Four times a day (QID) | RECTAL | Status: DC | PRN

## 2022-01-28 MED ORDER — OXYCODONE HCL 5 MG PO TABS
5.0000 mg | ORAL_TABLET | Freq: Four times a day (QID) | ORAL | Status: DC | PRN
  Administered 2022-01-28 – 2022-01-29 (×3): 5 mg via ORAL
  Administered 2022-01-30: 07:00:00 10 mg via ORAL
  Filled 2022-01-28 (×2): qty 2
  Filled 2022-01-28 (×2): qty 1

## 2022-01-28 MED ORDER — ASCORBIC ACID 500 MG PO TABS
500.0000 mg | ORAL_TABLET | Freq: Every day | ORAL | Status: DC
  Administered 2022-01-28 – 2022-01-30 (×3): 500 mg via ORAL
  Filled 2022-01-28 (×3): qty 1

## 2022-01-28 MED ORDER — SODIUM CHLORIDE 0.9 % IV SOLN
200.0000 mg | Freq: Once | INTRAVENOUS | Status: AC
  Administered 2022-01-28: 200 mg via INTRAVENOUS
  Filled 2022-01-28: qty 200

## 2022-01-28 MED ORDER — SODIUM CHLORIDE 0.9 % IV SOLN
2.0000 g | INTRAVENOUS | Status: DC
  Administered 2022-01-28 – 2022-01-29 (×2): 2 g via INTRAVENOUS
  Filled 2022-01-28 (×2): qty 2
  Filled 2022-01-28: qty 20

## 2022-01-28 MED ORDER — BACLOFEN 10 MG PO TABS
10.0000 mg | ORAL_TABLET | Freq: Two times a day (BID) | ORAL | Status: DC
  Administered 2022-01-28 – 2022-01-30 (×5): 10 mg via ORAL
  Filled 2022-01-28 (×6): qty 1

## 2022-01-28 MED ORDER — DIMETHYL FUMARATE 240 MG PO CPDR: 240.0000 mg | DELAYED_RELEASE_CAPSULE | Freq: Every day | ORAL | Status: DC

## 2022-01-28 MED ORDER — SODIUM CHLORIDE 0.9 % IV SOLN
100.0000 mg | Freq: Every day | INTRAVENOUS | Status: AC
  Administered 2022-01-29 – 2022-01-30 (×2): 100 mg via INTRAVENOUS
  Filled 2022-01-28 (×2): qty 100

## 2022-01-28 MED ORDER — MEMANTINE HCL 5 MG PO TABS
10.0000 mg | ORAL_TABLET | Freq: Two times a day (BID) | ORAL | Status: DC
  Administered 2022-01-28 – 2022-01-30 (×5): 10 mg via ORAL
  Filled 2022-01-28 (×5): qty 2

## 2022-01-28 MED ORDER — ACETAMINOPHEN 325 MG PO TABS: 650.0000 mg | ORAL_TABLET | Freq: Four times a day (QID) | ORAL | Status: DC | PRN

## 2022-01-28 MED ORDER — ONDANSETRON HCL 4 MG PO TABS: 4.0000 mg | ORAL_TABLET | Freq: Four times a day (QID) | ORAL | Status: DC | PRN

## 2022-01-28 MED ORDER — ONDANSETRON HCL 4 MG/2ML IJ SOLN: 4.0000 mg | Freq: Four times a day (QID) | INTRAMUSCULAR | Status: DC | PRN

## 2022-01-28 NOTE — Progress Notes (Signed)
CODE SEPSIS - PHARMACY COMMUNICATION  **Broad Spectrum Antibiotics should be administered within 1 hour of Sepsis diagnosis**  Time Code Sepsis Called/Page Received: 2/3 @ 0543  Antibiotics Ordered: Cefepime 2 gm   Time of 1st antibiotic administration: 2/3 @ [id number]   Additional action taken by pharmacy:   If necessary, Name of Provider/Nurse Contacted:     Nyna Chilton D ,PharmD Clinical Pharmacist  01/28/2022  7:11 AM

## 2022-01-28 NOTE — Progress Notes (Signed)
PHARMACIST - PHYSICIAN COMMUNICATION  CONCERNING:  Enoxaparin (Lovenox) for DVT Prophylaxis    RECOMMENDATION: Patient was prescribed enoxaprin 40mg  q24 hours for VTE prophylaxis.   Filed Weights   01/28/22 0813  Weight: 90.7 kg (200 lb)    Body mass index is 33.28 kg/m.  Estimated Creatinine Clearance: 51.1 mL/min (A) (by C-G formula based on SCr of 1.32 mg/dL (H)).   Based on [facility] policy patient is candidate for enoxaparin 0.5mg /kg TBW SQ every 24 hours based on BMI being >30.  DESCRIPTION: Pharmacy has adjusted enoxaparin dose per [facility] policy.  Patient is now receiving enoxaparin 45 mg every 24 hours   [facility], PharmD, BCPS Clinical Pharmacist 01/28/2022 9:33 AM

## 2022-01-28 NOTE — Sepsis Progress Note (Signed)
Following per sepsis protocol   

## 2022-01-28 NOTE — ED Notes (Signed)
Pt to be admitted to room 108 per MD order. Report given to [facility] RN. Belongings sent with pt. VSS. Pt transported by ED tech via stretcher.

## 2022-01-28 NOTE — ED Provider Notes (Signed)
[facility] Provider Note    Event Date/Time   First MD Initiated Contact with Patient 01/28/22 [phone]     (approximate)   History   Weakness and Altered Mental Status   HPI  Adrienne Strickland is a 61 y.o. female brought to the ED via EMS from home with a chief complaint of generalized malaise/weakness and confusion.  Patient has a past medical history of multiple sclerosis with indwelling Foley catheter in place.  Foley catheter is due to be changed on 01/31/2022.  Endorses chills.  Denies fever, cough, chest pain, shortness of breath, abdominal pain, nausea or vomiting.     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   Falls frequently 05/27/2020   Right leg numbness 05/19/2020   Right leg weakness 05/19/2020   Unsteadiness on feet 03/02/2020   Constipation 04/29/2016   Seasonal allergic rhinitis due to pollen 03/22/2016   Pain in both lower legs 01/14/2016   Urinary frequency 10/01/2015   Neurogenic bladder 10/01/2015   Chronic pain syndrome 06/18/2015   Duodenal ulcer with hemorrhage 04/24/2015   Pain, dental 04/24/2015   Status post tooth extraction 04/24/2015   Arthritis 01/20/2015   Extremity pain 05/20/2014   Multiple sclerosis (HCC) 09/26/2013   Abnormality of gait 09/26/2013   Dysuria 04/19/2013   Mild cognitive impairment 04/19/2013     Past Surgical History   Past Surgical History:  Procedure  Laterality Date   BOTOX INJECTION N/A 12/13/2017   Procedure: BOTOX INJECTION;  Surgeon: Vanna Scotland, MD;  Location: [facility] ORS;  Service: Urology;  Laterality: N/A;   CHOLECYSTECTOMY     COLONOSCOPY WITH PROPOFOL N/A 05/22/2018   Procedure: COLONOSCOPY WITH PROPOFOL;  Surgeon: Christena Deem, MD;  Location: [facility] ENDOSCOPY;  Service: Endoscopy;  Laterality: N/A;   CYSTOSCOPY N/A 12/13/2017   Procedure: CYSTOSCOPY;  Surgeon: Vanna Scotland, MD;  Location: [facility] ORS;  Service: Urology;  Laterality: N/A;   GALLBLADDER SURGERY       Home Medications   Prior to Admission medications   Medication Sig Start Date End Date Taking? Authorizing Provider  baclofen (LIORESAL) 10 MG tablet Take 1 tablet by mouth 2 (two) times daily. 07/06/21   [provider]  Cholecalciferol (VITAMIN D-3) 1000 UNITS CAPS Take 1,000 Units by mouth daily.     [provider]  Dimethyl Fumarate 240 MG CPDR Take by mouth. 09/14/21   [provider]  donepezil (ARICEPT) 10 MG tablet Take 10 mg by mouth daily. 09/15/21   [provider]  fluticasone (FLONASE) 50 MCG/ACT nasal spray Place 2 sprays into both nostrils daily. 08/08/15   Domenick Gong, MD  memantine (NAMENDA) 10 MG tablet Take 1 tablet by mouth 2 (two) times daily. 07/19/21   [provider]  oxycodone (OXY-IR) 5 MG capsule Take 5 mg by mouth every 6 (six) hours.    [provider]  vitamin B-12 (CYANOCOBALAMIN) 1000 MCG tablet Take 1,000 mcg by mouth daily.    [provider]     Allergies  Sulfa antibiotics and Tetanus toxoids   Family History   Family History  Problem Relation Age of Onset   Seizures Mother    Emphysema Father    Heart disease Brother    Kidney disease Neg Hx    Prostate cancer Neg Hx    Bladder Cancer Neg Hx      Physical Exam  Triage Vital Signs: ED Triage Vitals [01/28/22 0439]  Enc Vitals Group     BP (!) 125/56     Pulse Rate (!) 114     Resp 18      Temp 99.9 F (37.7 C)     Temp src      SpO2 100 %     Weight      Height      Head Circumference      Peak Flow      Pain Score 0     Pain Loc      Pain Edu?      Excl. in GC?     Updated Vital Signs: BP (!) 125/56    Pulse (!) 114    Temp 99.9 F (37.7 C)    Resp 18    LMP 11/23/2018 (Approximate)    SpO2 100%    General: Awake, mild distress.  CV:  Tachycardic.  Good peripheral perfusion.  Resp:  Normal effort.  CTAB. Abd:  Nontender to light or deep palpation.  No distention.  Other:  No vesicles.  Alert and oriented x3.  CN II-XII grossly intact.   ED Results / Procedures / Treatments  Labs (all labs ordered are listed, but only abnormal results are displayed) Labs Reviewed  COMPREHENSIVE METABOLIC PANEL - Abnormal; Notable for the following components:      Result Value   Glucose, Bld 155 (*)    BUN 22 (*)    Creatinine, Ser 1.32 (*)    GFR, Estimated 47 (*)    All other components within normal limits  CBC - Abnormal; Notable for the following components:   WBC 24.4 (*)    RBC 5.26 (*)    HCT 46.1 (*)    All other components within normal limits  LACTIC ACID, PLASMA - Abnormal; Notable for the following components:   Lactic Acid, Venous 2.0 (*)    All other components within normal limits  URINALYSIS, ROUTINE W REFLEX MICROSCOPIC - Abnormal; Notable for the following components:   APPearance CLOUDY (*)    Hgb urine dipstick LARGE (*)    Protein, ur >300 (*)    Nitrite POSITIVE (*)    Leukocytes,Ua LARGE (*)    All other components within normal limits  URINALYSIS, MICROSCOPIC (REFLEX) - Abnormal; Notable for the following components:   Bacteria, UA RARE (*)    All other components within normal limits  URINE CULTURE  CULTURE, BLOOD (SINGLE)  CULTURE, BLOOD (ROUTINE X 2)  CULTURE, BLOOD (ROUTINE X 2)  RESP PANEL BY RT-PCR (FLU A&B, COVID) ARPGX2  PROTIME-INR  LACTIC ACID, PLASMA  DIFFERENTIAL     EKG  None   RADIOLOGY I have personally  reviewed patient's chest x-ray as well as the radiology interpretation:  Chest x-ray:  Official radiology report(s): No results found.   PROCEDURES:  Critical Care performed: No  .1-3 Lead EKG Interpretation Performed by: Irean Hong, MD Authorized by: Chiquita Loth  J, MD     Interpretation: abnormal     ECG rate:  114   ECG rate assessment: tachycardic     Rhythm: sinus tachycardia     Ectopy: none     Conduction: normal   Comments:     Patient placed on cardiac monitor to evaluate for arrhythmias   MEDICATIONS ORDERED IN ED: Medications  lactated ringers bolus 1,000 mL (has no administration in time range)    And  lactated ringers bolus 1,000 mL (has no administration in time range)    And  lactated ringers bolus 1,000 mL (has no administration in time range)  ceFEPIme (MAXIPIME) 2 g in sodium chloride 0.9 % 100 mL IVPB (has no administration in time range)     IMPRESSION / MDM / ASSESSMENT AND PLAN / ED COURSE  I reviewed the triage vital signs and the nursing notes.                             61 year old female presenting with generalized weakness and altered mentation. Differential diagnosis includes, but is not limited to, alcohol, illicit or prescription medications, or other toxic ingestion; intracranial pathology such as stroke or intracerebral hemorrhage; fever or infectious causes including sepsis; hypoxemia and/or hypercarbia; uremia; trauma; endocrine related disorders such as diabetes, hypoglycemia, and thyroid-related diseases; hypertensive encephalopathy; etc. I have personally reviewed patient's records and see that she had an office visit with urology on 01/03/2022 for Foley catheter change.  Last neurology office visit 10/14/2021.  The patient is on the cardiac monitor to evaluate for evidence of arrhythmia and/or significant heart rate changes.  Laboratory results demonstrate leukocytosis with WBC 24.4, AKI with BUN 22/creatinine 1.32, lactic acid is 2,  UA positive for nitrites and leukocytes.  Patient meets sepsis criteria, will activate ED code sepsis and infuse IV lactated Ringer's 30 cc/kilo and 2 g IV cefepime.  Will consult hospitalist services for evaluation and admission.      FINAL CLINICAL IMPRESSION(S) / ED DIAGNOSES   Final diagnoses:  Sepsis, due to unspecified organism, unspecified whether acute organ dysfunction present [facility])  Lower urinary tract infectious disease  AKI (acute kidney injury) (HCC)  Leukocytosis, unspecified type     Rx / DC Orders   ED Discharge Orders     None        Note:  This document was prepared using Dragon voice recognition software and may include unintentional dictation errors.   Irean Hong, MD 01/28/22 [id number]

## 2022-01-28 NOTE — Progress Notes (Signed)
PHARMACY -  BRIEF ANTIBIOTIC NOTE   Pharmacy has received consult(s) for Cefepime from an ED provider.  The patient's profile has been reviewed for ht/wt/allergies/indication/available labs.    One time order(s) placed for Cefepime 2 gm IV X 1   Further antibiotics/pharmacy consults should be ordered by admitting physician if indicated.                       Thank you, Remas Sobel D 01/28/2022  5:51 AM

## 2022-01-28 NOTE — ED Triage Notes (Signed)
Pt brought from home via EMS for weakness/possible UTI/ confusion. Pt has Hx of MS and a foley catheter in place.

## 2022-01-28 NOTE — H&P (Addendum)
History and Physical    Patient: Adrienne Strickland [id number] DOB: [birth year] DOA: 01/28/2022 DOS: the patient was seen and examined on 01/28/2022 PCP: Danae Orleans, MD  Patient coming from: Home  Chief Complaint:  Chief Complaint  Patient presents with   Weakness   Altered Mental Status    HPI: Adrienne Strickland is a 61 y.o. female with medical history significant for multiple sclerosis with neurogenic bladder status post chronic indwelling Foley catheter presents to the ER for evaluation of weakness, poor oral intake and confusion.  According to the patient to be changed on 01/31/22. She complains of worsening weakness associated with anorexia and nausea.  She denies having any abdominal pain, no fever, no chills, no cough, no shortness of breath or chest pain. She is currently wheelchair-bound and requires assistance with most activities of daily living.  She denies having any lower extremity swelling, no blurred vision, no headache, no palpitations or diaphoresis.    Review of Systems: As mentioned in the history of present illness. All other systems reviewed and are negative. Past Medical History:  Diagnosis Date   Abnormality of gait 09/26/2013   Arthritis    Duodenal ulcer 02/27/2015   Memory difficulty    Multiple sclerosis ([place]) 09/26/2013   Multiple sclerosis ([place])    Neurogenic bladder    self caths   Urinary frequency    Past Surgical History:  Procedure Laterality Date   BOTOX INJECTION N/A 12/13/2017   Procedure: BOTOX INJECTION;  Surgeon: Hollice Espy, MD;  Location: [facility] ORS;  Service: Urology;  Laterality: N/A;   CHOLECYSTECTOMY     COLONOSCOPY WITH PROPOFOL N/A 05/22/2018   Procedure: COLONOSCOPY WITH PROPOFOL;  Surgeon: Lollie Sails, MD;  Location: [facility] ENDOSCOPY;  Service: Endoscopy;  Laterality: N/A;   CYSTOSCOPY N/A 12/13/2017   Procedure: CYSTOSCOPY;  Surgeon: Hollice Espy, MD;  Location: [facility] ORS;  Service: Urology;  Laterality: N/A;    GALLBLADDER SURGERY     Social History:  reports that she has never smoked. She has never used smokeless tobacco. She reports current alcohol use. She reports that she does not use drugs.  Allergies  Allergen Reactions   Sulfa Antibiotics Nausea And Vomiting   Tetanus Toxoids Swelling    Extreme swelling at injection site    Family History  Problem Relation Age of Onset   Seizures Mother    Emphysema Father    Heart disease Brother    Kidney disease Neg Hx    Prostate cancer Neg Hx    Bladder Cancer Neg Hx     Prior to Admission medications   Medication Sig Start Date End Date Taking? Authorizing Provider  baclofen (LIORESAL) 10 MG tablet Take 1 tablet by mouth 2 (two) times daily. 07/06/21  Yes [provider]  Cholecalciferol (VITAMIN D-3) 1000 UNITS CAPS Take 1,000 Units by mouth daily.    Yes [provider]  Dimethyl Fumarate 240 MG CPDR Take 240 mg by mouth at bedtime. 09/14/21  Yes [provider]  donepezil (ARICEPT) 10 MG tablet Take 10 mg by mouth 2 (two) times daily. 09/15/21  Yes [provider]  fluticasone (FLONASE) 50 MCG/ACT nasal spray Place 2 sprays into both nostrils daily. 08/08/15  Yes Melynda Ripple, MD  memantine (NAMENDA) 10 MG tablet Take 1 tablet by mouth 2 (two) times daily. 07/19/21  Yes [provider]  oxyCODONE (OXY IR/ROXICODONE) 5 MG immediate release tablet Take 5-10 mg by mouth every 6 (six) hours as needed for pain.  Take 2 tablets for first AM dose. 01/21/22 02/19/23 Yes [provider]  vitamin B-12 (CYANOCOBALAMIN) 1000 MCG tablet Take 1,000 mcg by mouth daily.   Yes [provider]    Physical Exam: Vitals:   01/28/22 0439 01/28/22 0813  BP: (!) 125/56 (!) 133/91  Pulse: (!) 114 96  Resp: 18 18  Temp: 99.9 F (37.7 C) 98.1 F (36.7 C)  TempSrc:  Oral  SpO2: 100% 97%   Physical Exam Constitutional:      Appearance: Normal appearance.  HENT:     Head: Normocephalic and  atraumatic.     Nose: Nose normal.     Mouth/Throat:     Mouth: Mucous membranes are moist.  Eyes:     Pupils: Pupils are equal, round, and reactive to light.  Cardiovascular:     Rate and Rhythm: Tachycardia present.  Pulmonary:     Effort: Pulmonary effort is normal.     Breath sounds: Normal breath sounds.  Abdominal:     General: Abdomen is flat. Bowel sounds are normal.     Palpations: Abdomen is soft.  Musculoskeletal:     Cervical back: Neck supple.  Skin:    General: Skin is warm and dry.  Neurological:     Mental Status: She is alert and oriented to person, place, and time.     Motor: Weakness present.  Psychiatric:        Mood and Affect: Mood normal.        Behavior: Behavior normal.     Data Reviewed: Labs reviewed from patient has marked leukocytosis of 24.8 thousand, lactic acid of 2.0 with pyuria Noted to have worsening renal function, at baseline creatinine is 0.96 and today on admission it is 1.32 SARS coronavirus 2 PCR test is positive Chest x-ray reviewed and shows no evidence of acute cardiopulmonary disease Twelve-lead EKG reviewed by me shows sinus tachycardia Prior urine culture from 08/22 reviewed and positive for E. coli sensitive to cephalosporins  There are no new results to review at this time.  Assessment and Plan:  Principal Problem:   Sepsis ([place]) Active Problems:   Multiple sclerosis ([place])   Neurogenic bladder   Lumbar radiculopathy   UTI (urinary tract infection)   AKI (acute kidney injury) ([place])   Sepsis from a urinary source Patient has a history of MS with neurogenic bladder and has a chronic indwelling Foley, She was tachycardic, had a low-grade fever with a Tmax of 99, marked leukocytosis and lactic acidosis with pyuria. Prior urine culture yielded E. Coli Continue aggressive IV fluid resuscitation Place patient on IV Rocephin Follow-up results of blood and urine culture     History of neurogenic bladder Secondary to  MS Patient has a chronic indwelling Foley catheter scheduled to be changed on 01/31/22.  We will change during this hospitalization     Multiple sclerosis Continue baclofen and Dimethyl Fumarate     Mild cognitive deficit Continue Aricept and memantine    AKI Secondary to poor oral intake Continue IVF hydration Repeat renal parameters in a.m.    COVID 19 viral infection Patient's SARS coronavirus 2 PCR test is positive Start patient on remdesivir per protocol Supportive care with bronchodilator therapy, antitussives and vitamins.    Advance Care Planning:   Code Status: Prior.  Full code  Consults: None  Family Communication: Greater than 50% of time was spent discussing patient's condition and plan of care with with her at the bedside.  She lists her significant other  Derek as her healthcare power of attorney.  CODE STATUS was discussed and she is a full code.   Severity of Illness: The appropriate patient status for this patient is INPATIENT. Inpatient status is judged to be reasonable and necessary in order to provide the required intensity of service to ensure the patient's safety. The patient's presenting symptoms, physical exam findings, and initial radiographic and laboratory data in the context of their chronic comorbidities is felt to place them at high risk for further clinical deterioration. Furthermore, it is not anticipated that the patient will be medically stable for discharge from the hospital within 2 midnights of admission.   * I certify that at the point of admission it is my clinical judgment that the patient will require inpatient hospital care spanning beyond 2 midnights from the point of admission due to high intensity of service, high risk for further deterioration and high frequency of surveillance required.*  Author: Collier Bullock, MD 01/28/2022 8:24 AM  For on call review [email or web address].

## 2022-01-29 DIAGNOSIS — A419 Sepsis, unspecified organism: Secondary | ICD-10-CM

## 2022-01-29 LAB — CBC
HCT: 35.4 % — ABNORMAL LOW (ref 36.0–46.0)
Hemoglobin: 11.7 g/dL — ABNORMAL LOW (ref 12.0–15.0)
MCH: 28.7 pg (ref 26.0–34.0)
MCHC: 33.1 g/dL (ref 30.0–36.0)
MCV: 86.8 fL (ref 80.0–100.0)
Platelets: 237 10*3/uL (ref 150–400)
RBC: 4.08 MIL/uL (ref 3.87–5.11)
RDW: 14.1 % (ref 11.5–15.5)
WBC: 15.1 10*3/uL — ABNORMAL HIGH (ref 4.0–10.5)
nRBC: 0 % (ref 0.0–0.2)

## 2022-01-29 LAB — BASIC METABOLIC PANEL
Anion gap: 8 (ref 5–15)
BUN: 11 mg/dL (ref 6–20)
CO2: 26 mmol/L (ref 22–32)
Calcium: 8.6 mg/dL — ABNORMAL LOW (ref 8.9–10.3)
Chloride: 106 mmol/L (ref 98–111)
Creatinine, Ser: 0.76 mg/dL (ref 0.44–1.00)
GFR, Estimated: >60 mL/min (ref >60)
Glucose, Bld: 100 mg/dL — ABNORMAL HIGH (ref 70–99)
Potassium: 3.7 mmol/L (ref 3.5–5.1)
Sodium: 140 mmol/L (ref 135–145)

## 2022-01-29 LAB — PROTIME-INR
INR: 1.5 — ABNORMAL HIGH (ref 0.8–1.2)
Prothrombin Time: 17.8 seconds — ABNORMAL HIGH (ref 11.4–15.2)

## 2022-01-29 LAB — PROCALCITONIN: Procalcitonin: 0.23 ng/mL

## 2022-01-29 LAB — CORTISOL-AM, BLOOD: Cortisol - AM: 15.3 ug/dL (ref 6.7–22.6)

## 2022-01-29 NOTE — Progress Notes (Signed)
PROGRESS NOTE    Adrienne Strickland  [id number] DOB: 05/07/1961 DOA: 01/28/2022 PCP: Danae Orleans, MD  [id number]   Assessment & Plan:   Principal Problem:   Sepsis [facility]) Active Problems:   Multiple sclerosis ([place])   Neurogenic bladder   Lumbar radiculopathy   UTI (urinary tract infection)   AKI (acute kidney injury) ([place])   Adrienne Strickland is a 61 y.o. female with medical history significant for multiple sclerosis with neurogenic bladder status post chronic indwelling Foley catheter presents to the ER for evaluation of weakness, poor oral intake and confusion.    Sepsis 2/2 UTI, POA Patient has a history of MS with neurogenic bladder and has a chronic indwelling Foley on presentation. She was tachycardic, marked leukocytosis. --started on ceftriaxone on admission. Plan: --cont ceftriaxone pending urine cx   History of neurogenic bladder Secondary to MS Patient has a chronic indwelling Foley catheter was scheduled to be changed on 01/31/22.  --Foley exchanged in the ED.   COVID 19 viral infection --no respiratory symptoms.  Not hypoxic.  Pt was vaccinated. --started on IV Remdesivir on admission. --will finish 3 doses.  AKI --Cr 1.32 on presentation, already improved to 0.76 next day with IVF. Secondary to poor oral intake --d/c MIVF.  Oral hydration now.   Multiple sclerosis Continue baclofen and Dimethyl Fumarate   Mild cognitive deficit Continue Aricept and memantine    DVT prophylaxis: Lovenox SQ Code Status: Full code  Family Communication:  Level of care: Telemetry Medical Dispo:   The patient is from: home Anticipated d/c is to: home Anticipated d/c date is: likely tomorrow Patient currently is not medically ready to d/c due to: on IV abx pending urine cx and also IV Remdesivir   Subjective and Interval History:  Pt reported feeling much better, no more nausea, able to eat.   Objective: Vitals:   01/29/22 0532 01/29/22 0821 01/29/22 1224  01/29/22 1545  BP: 132/75 131/62 136/64 135/61  Pulse: 88 92 79 84  Resp: 19 16 16 18   Temp: 98.7 F (37.1 C) 98.3 F (36.8 C) 98.3 F (36.8 C) 98.2 F (36.8 C)  TempSrc:  Oral Oral Oral  SpO2: 100% 99% 100% 100%  Weight:        Intake/Output Summary (Last 24 hours) at 01/29/2022 1628 Last data filed at 01/29/2022 1605 Gross per 24 hour  Intake 200 ml  Output 4550 ml  Net -4350 ml   Filed Weights   01/28/22 0813  Weight: 90.7 kg    Examination:   Constitutional: NAD, AAOx3 HEENT: conjunctivae and lids normal, EOMI CV: No cyanosis.   RESP: normal respiratory effort, on RA SKIN: warm, dry Psych: Normal mood and affect.  Appropriate judgement and reason   Data Reviewed: I have personally reviewed following labs and imaging studies  CBC: Recent Labs  Lab 01/28/22 0443 01/29/22 0624  WBC 24.8*   24.4* 15.1*  NEUTROABS 22.5*  --   HGB 14.9   14.9 11.7*  HCT 45.9   46.1* 35.4*  MCV 88.3   87.6 86.8  PLT 399   358 [id number]   Basic Metabolic Panel: Recent Labs  Lab 01/28/22 0443 01/29/22 0624  NA 137 140  K 4.2 3.7  CL 104 106  CO2 24 26  GLUCOSE 155* 100*  BUN 22* 11  CREATININE 1.32* 0.76  CALCIUM 9.4 8.6*   GFR: Estimated Creatinine Clearance: 84.3 mL/min (by C-G formula based on SCr of 0.76 mg/dL). Liver Function Tests: Recent Labs  Lab 01/28/22 0443  AST 22  ALT 18  ALKPHOS 110  BILITOT 0.9  PROT 7.5  ALBUMIN 3.9   No results for input(s): LIPASE, AMYLASE in the last 168 hours. No results for input(s): AMMONIA in the last 168 hours. Coagulation Profile: Recent Labs  Lab 01/28/22 0443 01/29/22 0624  INR 1.1 1.5*   Cardiac Enzymes: No results for input(s): CKTOTAL, CKMB, CKMBINDEX, TROPONINI in the last 168 hours. BNP (last 3 results) No results for input(s): PROBNP in the last 8760 hours. HbA1C: No results for input(s): HGBA1C in the last 72 hours. CBG: No results for input(s): GLUCAP in the last 168 hours. Lipid Profile: No results for  input(s): CHOL, HDL, LDLCALC, TRIG, CHOLHDL, LDLDIRECT in the last 72 hours. Thyroid Function Tests: No results for input(s): TSH, T4TOTAL, FREET4, T3FREE, THYROIDAB in the last 72 hours. Anemia Panel: No results for input(s): VITAMINB12, FOLATE, FERRITIN, TIBC, IRON, RETICCTPCT in the last 72 hours. Sepsis Labs: Recent Labs  Lab 01/28/22 0443 01/28/22 0636 01/29/22 0624  PROCALCITON  --   --  0.23  LATICACIDVEN 2.0* 1.8  --     Recent Results (from the past 240 hour(s))  Urine Culture     Status: Abnormal (Preliminary result)   Collection Time: 01/28/22  4:43 AM   Specimen: Urine, Random  Result Value Ref Range Status   Specimen Description   Final    URINE, RANDOM Performed at [facility], [address] [place], [place] [address]    Special Requests   Final    NONE Performed at [facility], [address] [place], [place] [address]    Culture (A)  Final    >=100,000 COLONIES/mL ESCHERICHIA COLI CULTURE REINCUBATED FOR BETTER GROWTH SUSCEPTIBILITIES TO FOLLOW Performed at [facility], [place] [address], [place], [place] [address]    Report Status PENDING  Incomplete  Culture, blood (routine x 2)     Status: None (Preliminary result)   Collection Time: 01/28/22  5:56 AM   Specimen: BLOOD  Result Value Ref Range Status   Specimen Description BLOOD RIGHT WRIST  Final   Special Requests   Final    BOTTLES DRAWN AEROBIC AND ANAEROBIC Blood Culture results may not be optimal due to an inadequate volume of blood received in culture bottles   Culture   Final    NO GROWTH 1 DAY Performed at [facility], [address] [place], [place] [address]    Report Status PENDING  Incomplete  Culture, blood (routine x 2)     Status: None (Preliminary result)   Collection Time: 01/28/22  6:36 AM   Specimen: BLOOD  Result Value Ref Range Status   Specimen Description BLOOD LEFT ASSIST CONTROL  Final   Special Requests   Final    BOTTLES DRAWN  AEROBIC AND ANAEROBIC Blood Culture adequate volume   Culture   Final    NO GROWTH 1 DAY Performed at [facility], [address] [place], [place] [address]    Report Status PENDING  Incomplete  Resp Panel by RT-PCR (Flu A&B, Covid) Nasopharyngeal Swab     Status: Abnormal   Collection Time: 01/28/22  6:57 AM   Specimen: Nasopharyngeal Swab; Nasopharyngeal(NP) swabs in vial transport medium  Result Value Ref Range Status   SARS Coronavirus 2 by RT PCR POSITIVE (A) NEGATIVE Final    Comment: (NOTE) SARS-CoV-2 target nucleic acids are DETECTED.  The SARS-CoV-2 RNA is generally detectable in upper respiratory specimens during  the acute phase of infection. Positive results are indicative of the presence of the identified virus, but do not rule out bacterial infection or co-infection with other pathogens not detected by the test. Clinical correlation with patient history and other diagnostic information is necessary to determine patient infection status. The expected result is Negative.  Fact Sheet for Patients: EntrepreneurPulse.com.au  Fact Sheet for Healthcare Providers: IncredibleEmployment.be  This test is not yet approved or cleared by the [place] FDA and  has been authorized for detection and/or diagnosis of SARS-CoV-2 by FDA under an Emergency Use Authorization (EUA).  This EUA will remain in effect (meaning this test can be used) for the duration of  the COVID-19 declaration under Section 564(b)(1) of the A ct, 21 U.S.C. section 360bbb-3(b)(1), unless the authorization is terminated or revoked sooner.     Influenza A by PCR NEGATIVE NEGATIVE Final   Influenza B by PCR NEGATIVE NEGATIVE Final    Comment: (NOTE) The Xpert Xpress SARS-CoV-2/FLU/RSV plus assay is intended as an aid in the diagnosis of influenza from Nasopharyngeal swab specimens and should not be used as a sole basis for treatment. Nasal washings  and aspirates are unacceptable for Xpert Xpress SARS-CoV-2/FLU/RSV testing.  Fact Sheet for Patients: EntrepreneurPulse.com.au  Fact Sheet for Healthcare Providers: IncredibleEmployment.be  This test is not yet approved or cleared by the [place] FDA and has been authorized for detection and/or diagnosis of SARS-CoV-2 by FDA under an Emergency Use Authorization (EUA). This EUA will remain in effect (meaning this test can be used) for the duration of the COVID-19 declaration under Section 564(b)(1) of the Act, 21 U.S.C. section 360bbb-3(b)(1), unless the authorization is terminated or revoked.  Performed at [facility], [address], [place], [place] [address]       Radiology Studies: [facility] Chest [place] 1 View  Result Date: 01/28/2022 CLINICAL DATA:  61 year old female with weakness, confusion, possible sepsis. EXAM: PORTABLE CHEST 1 VIEW COMPARISON:  Portable chest 07/28/2021. FINDINGS: Portable AP upright view at 0606 hours. Normal lung volumes and mediastinal contours. Visualized tracheal air column is within normal limits. Allowing for portable technique the lungs are clear. No pneumothorax or pleural effusion. No osseous abnormality identified. Negative visible bowel gas. IMPRESSION: Negative portable chest. Electronically Signed   By: Genevie Ann M.D.   On: 01/28/2022 06:43     Scheduled Meds:  albuterol  2 puff Inhalation Q6H   vitamin C  500 mg Oral Daily   baclofen  10 mg Oral BID   Chlorhexidine Gluconate Cloth  6 each Topical Daily   cholecalciferol  1,000 Units Oral Daily   Dimethyl Fumarate  240 mg Oral QHS   donepezil  10 mg Oral BID   enoxaparin (LOVENOX) injection  0.5 mg/kg Subcutaneous Q24H   fluticasone  2 spray Each Nare Daily   memantine  10 mg Oral BID   vitamin B-12  1,000 mcg Oral Daily   zinc sulfate  220 mg Oral Daily   Continuous Infusions:  cefTRIAXone (ROCEPHIN)  IV Stopped (01/28/22 2209)    remdesivir 100 mg in NS 100 mL Stopped (01/29/22 0955)     LOS: 1 day     Enzo Bi, MD [facility] If 7PM-7AM, please contact night-coverage 01/29/2022, 4:28 PM

## 2022-01-30 LAB — CBC
HCT: 35 % — ABNORMAL LOW (ref 36.0–46.0)
Hemoglobin: 11.5 g/dL — ABNORMAL LOW (ref 12.0–15.0)
MCH: 29 pg (ref 26.0–34.0)
MCHC: 32.9 g/dL (ref 30.0–36.0)
MCV: 88.2 fL (ref 80.0–100.0)
Platelets: 255 10*3/uL (ref 150–400)
RBC: 3.97 MIL/uL (ref 3.87–5.11)
RDW: 13.6 % (ref 11.5–15.5)
WBC: 9.2 10*3/uL (ref 4.0–10.5)
nRBC: 0 % (ref 0.0–0.2)

## 2022-01-30 LAB — URINE CULTURE: Culture: >=100000 — AB

## 2022-01-30 LAB — BASIC METABOLIC PANEL
Anion gap: 7 (ref 5–15)
BUN: 12 mg/dL (ref 6–20)
CO2: 26 mmol/L (ref 22–32)
Calcium: 8.8 mg/dL — ABNORMAL LOW (ref 8.9–10.3)
Chloride: 108 mmol/L (ref 98–111)
Creatinine, Ser: 0.92 mg/dL (ref 0.44–1.00)
GFR, Estimated: >60 mL/min (ref >60)
Glucose, Bld: 105 mg/dL — ABNORMAL HIGH (ref 70–99)
Potassium: 4.5 mmol/L (ref 3.5–5.1)
Sodium: 141 mmol/L (ref 135–145)

## 2022-01-30 LAB — MAGNESIUM: Magnesium: 2.2 mg/dL (ref 1.7–2.4)

## 2022-01-30 MED ORDER — PIPERACILLIN-TAZOBACTAM 3.375 G IVPB: 3.3750 g | Freq: Three times a day (TID) | INTRAVENOUS | Status: DC

## 2022-01-30 MED ORDER — AMOXICILLIN 500 MG PO CAPS: 500.0000 mg | ORAL_CAPSULE | Freq: Three times a day (TID) | ORAL | 0 refills | Status: AC

## 2022-01-30 MED ORDER — SODIUM CHLORIDE 0.9 % IV SOLN
2.0000 g | INTRAVENOUS | Status: DC
  Filled 2022-01-30: qty 20

## 2022-01-30 MED ORDER — AMOXICILLIN 500 MG PO CAPS
500.0000 mg | ORAL_CAPSULE | Freq: Three times a day (TID) | ORAL | Status: DC
Start: 2022-01-30 — End: 2022-01-30
  Administered 2022-01-30: 12:00:00 500 mg via ORAL
  Filled 2022-01-30 (×2): qty 1

## 2022-01-30 NOTE — Progress Notes (Signed)
Received MD order to discharge patient to home.  Reviewed discharge instructions, follow up appointments, home meds and prescriptions with patient and patient verbalized understanding

## 2022-01-30 NOTE — Discharge Summary (Signed)
Physician Discharge Summary   Adrienne Strickland  female DOB: 02-12-1961  ZOX:[id number]  PCP: Jenell Milliner, MD  Admit date: 01/28/2022 Discharge date: 01/30/2022  Admitted From: home Disposition:  home Significant other updated at bedside prior to discharge.  CODE STATUS: Full code  Discharge Instructions     Discharge instructions   Complete by: As directed    You have COVID infection but with no respiratory symptoms, and have received 3 days of IV Remdesivir for treatment, so you can go home, but continue to monitor your breathing.  Your urine culture grew E coli and Enterococcus.  You have received 3 days of IV antibiotic and finished treatment for E coli.  You are discharged on 5 days of amoxicillin to treat the Enterococcus at home.   Dr. Darlin Priestly [facility] Course:  For full details, please see H&P, progress notes, consult notes and ancillary notes.  Briefly,  Adrienne Strickland is a 61 y.o. female with medical history significant for multiple sclerosis with neurogenic bladder with chronic indwelling Foley catheter presented to the ER for evaluation of weakness, poor oral intake and confusion.     Sepsis 2/2 UTI, POA --She was tachycardic, marked leukocytosis. Patient has a history of MS with neurogenic bladder and has a chronic indwelling Foley on presentation.  Pt reported Foley exchange every month.  In light of UTI, would rec outpatient Foley exchange to be done more frequently. --started on ceftriaxone on admission.  Pt's felt better quickly after abx treatment. --urine culture grew E coli and Enterococcus.  Pt received 3 days of IV ceftriaxone and finished treatment for E coli.  Pt was discharged on 5 days of amoxicillin to treat the Enterococcus at home.   History of neurogenic bladder Secondary to MS Patient has a chronic indwelling. --Foley exchanged in the ED. --Pt reported Foley exchange every month.  In light of UTI, would rec outpatient Foley  exchange to be done more frequently.   COVID 19 viral infection --no respiratory symptoms.  Not hypoxic.  Pt was vaccinated. --started on IV Remdesivir on admission, and finished 3 doses.   AKI --Cr 1.32 on presentation, already improved to 0.76 next day with IVF. Secondary to poor oral intake   Multiple sclerosis Continue home baclofen and Dimethyl Fumarate   Mild cognitive deficit Continue Aricept and memantine   Discharge Diagnoses:  Principal Problem:   Sepsis (HCC) Active Problems:   Multiple sclerosis (HCC)   Neurogenic bladder   Lumbar radiculopathy   UTI (urinary tract infection)   AKI (acute kidney injury) (HCC)   30 Day Unplanned Readmission Risk Score    Flowsheet Row ED to Hosp-Admission (Current) from 01/28/2022 in [facility] (1C)  30 Day Unplanned Readmission Risk Score (%) 13.27 Filed at 01/30/2022 0801       This score is the patient's risk of an unplanned readmission within 30 days of being discharged (0 -100%). The score is based on dignosis, age, lab data, medications, orders, and past utilization.   Low:  0-14.9   Medium: 15-21.9   High: 22-29.9   Extreme: 30 and above         Discharge Instructions:  Allergies as of 01/30/2022       Reactions   Sulfa Antibiotics Nausea And Vomiting   Tetanus Toxoids Swelling   Extreme swelling at injection site        Medication List     TAKE  these medications    amoxicillin 500 MG capsule Commonly known as: AMOXIL Take 1 capsule (500 mg total) by mouth every 8 (eight) hours for 5 days.   baclofen 10 MG tablet Commonly known as: LIORESAL Take 1 tablet by mouth 2 (two) times daily.   Dimethyl Fumarate 240 MG Cpdr Take 240 mg by mouth at bedtime.   donepezil 10 MG tablet Commonly known as: ARICEPT Take 10 mg by mouth 2 (two) times daily.   fluticasone 50 MCG/ACT nasal spray Commonly known as: FLONASE Place 2 sprays into both nostrils daily.   memantine 10 MG  tablet Commonly known as: NAMENDA Take 1 tablet by mouth 2 (two) times daily.   oxyCODONE 5 MG immediate release tablet Commonly known as: Oxy IR/ROXICODONE Take 5-10 mg by mouth every 6 (six) hours as needed for pain. Take 2 tablets for first AM dose.   vitamin B-12 1000 MCG tablet Commonly known as: CYANOCOBALAMIN Take 1,000 mcg by mouth daily.   Vitamin D-3 25 MCG (1000 UT) Caps Take 1,000 Units by mouth daily.         Follow-up Information     Jenell Milliner, MD Follow up in 1 week(s).   Specialty: Internal Medicine Contact information: [address] [facility] [place] [address] [phone]                 Allergies  Allergen Reactions   Sulfa Antibiotics Nausea And Vomiting   Tetanus Toxoids Swelling    Extreme swelling at injection site     The results of significant diagnostics from this hospitalization (including imaging, microbiology, ancillary and laboratory) are listed below for reference.   Consultations:   Procedures/Studies: DG Chest Port 1 View  Result Date: 01/28/2022 CLINICAL DATA:  61 year old female with weakness, confusion, possible sepsis. EXAM: PORTABLE CHEST 1 VIEW COMPARISON:  Portable chest 07/28/2021. FINDINGS: Portable AP upright view at 0606 hours. Normal lung volumes and mediastinal contours. Visualized tracheal air column is within normal limits. Allowing for portable technique the lungs are clear. No pneumothorax or pleural effusion. No osseous abnormality identified. Negative visible bowel gas. IMPRESSION: Negative portable chest. Electronically Signed   By: Odessa Fleming M.D.   On: 01/28/2022 06:43      Labs: BNP (last 3 results) No results for input(s): BNP in the last 8760 hours. Basic Metabolic Panel: Recent Labs  Lab 01/28/22 0443 01/29/22 0624 01/30/22 0305  NA 137 140 141  K 4.2 3.7 4.5  CL 104 106 108  CO2 GLUCOSE 155* 100* 105*  BUN 22* 11 12  CREATININE 1.32* 0.76 0.92  CALCIUM  9.4 8.6* 8.8*  MG  --   --  2.2   Liver Function Tests: Recent Labs  Lab 01/28/22 0443  AST 22  ALT 18  ALKPHOS 110  BILITOT 0.9  PROT 7.5  ALBUMIN 3.9   No results for input(s): LIPASE, AMYLASE in the last 168 hours. No results for input(s): AMMONIA in the last 168 hours. CBC: Recent Labs  Lab 01/28/22 0443 01/29/22 0624 01/30/22 0305  WBC 24.8*   24.4* 15.1* 9.2  NEUTROABS 22.5*  --   --   HGB 14.9   14.9 11.7* 11.5*  HCT 45.9   46.1* 35.4* 35.0*  MCV 88.3   87.6 86.8 88.2  PLT 399   358 237 255   Cardiac Enzymes: No results for input(s): CKTOTAL, CKMB, CKMBINDEX, TROPONINI in the last 168 hours. BNP: Invalid input(s): POCBNP CBG: No  results for input(s): GLUCAP in the last 168 hours. D-Dimer Recent Labs    01/28/22 0919  DDIMER 1.22*   Hgb A1c No results for input(s): HGBA1C in the last 72 hours. Lipid Profile No results for input(s): CHOL, HDL, LDLCALC, TRIG, CHOLHDL, LDLDIRECT in the last 72 hours. Thyroid function studies No results for input(s): TSH, T4TOTAL, T3FREE, THYROIDAB in the last 72 hours.  Invalid input(s): FREET3 Anemia work up No results for input(s): VITAMINB12, FOLATE, FERRITIN, TIBC, IRON, RETICCTPCT in the last 72 hours. Urinalysis    Component Value Date/Time   COLORURINE YELLOW 01/28/2022 0443   APPEARANCEUR CLOUDY (A) 01/28/2022 0443   APPEARANCEUR Cloudy (A) 06/30/2021 1020   LABSPEC 1.010 01/28/2022 0443   PHURINE 6.0 01/28/2022 0443   GLUCOSEU NEGATIVE 01/28/2022 0443   HGBUR LARGE (A) 01/28/2022 0443   BILIRUBINUR NEGATIVE 01/28/2022 0443   BILIRUBINUR Negative 06/30/2021 1020   KETONESUR NEGATIVE 01/28/2022 0443   PROTEINUR >300 (A) 01/28/2022 0443   NITRITE POSITIVE (A) 01/28/2022 0443   LEUKOCYTESUR LARGE (A) 01/28/2022 0443   Sepsis Labs Invalid input(s): PROCALCITONIN,  WBC,  LACTICIDVEN Microbiology Recent Results (from the past 240 hour(s))  Urine Culture     Status: Abnormal   Collection Time: 01/28/22   4:43 AM   Specimen: Urine, Random  Result Value Ref Range Status   Specimen Description   Final    URINE, RANDOM Performed at [facility], [address] [place], [place] [address]    Special Requests   Final    NONE Performed at [facility], [address], [place], [place] [address]    Culture (A)  Final    >=100,000 COLONIES/mL ESCHERICHIA COLI >=100,000 COLONIES/mL ENTEROCOCCUS FAECALIS    Report Status 01/30/2022 FINAL  Final   Organism ID, Bacteria ESCHERICHIA COLI (A)  Final   Organism ID, Bacteria ENTEROCOCCUS FAECALIS (A)  Final      Susceptibility   Escherichia coli - MIC*    AMPICILLIN >=32 RESISTANT Resistant     CEFAZOLIN 8 SENSITIVE Sensitive     CEFEPIME <=0.12 SENSITIVE Sensitive     CEFTRIAXONE <=0.25 SENSITIVE Sensitive     CIPROFLOXACIN >=4 RESISTANT Resistant     GENTAMICIN <=1 SENSITIVE Sensitive     IMIPENEM <=0.25 SENSITIVE Sensitive     NITROFURANTOIN <=16 SENSITIVE Sensitive     TRIMETH/SULFA <=20 SENSITIVE Sensitive     AMPICILLIN/SULBACTAM 16 INTERMEDIATE Intermediate     PIP/TAZO <=4 SENSITIVE Sensitive     * >=100,000 COLONIES/mL ESCHERICHIA COLI   Enterococcus faecalis - MIC*    AMPICILLIN <=2 SENSITIVE Sensitive     NITROFURANTOIN <=16 SENSITIVE Sensitive     VANCOMYCIN 1 SENSITIVE Sensitive     * >=100,000 COLONIES/mL ENTEROCOCCUS FAECALIS  Culture, blood (routine x 2)     Status: None (Preliminary result)   Collection Time: 01/28/22  5:56 AM   Specimen: BLOOD  Result Value Ref Range Status   Specimen Description BLOOD RIGHT WRIST  Final   Special Requests   Final    BOTTLES DRAWN AEROBIC AND ANAEROBIC Blood Culture results may not be optimal due to an inadequate volume of blood received in culture bottles   Culture   Final    NO GROWTH 2 DAYS Performed at [facility], [address] [place], [place] [address]    Report Status PENDING  Incomplete  Culture, blood (routine x 2)     Status: None  (Preliminary result)   Collection Time: 01/28/22  6:36 AM  Specimen: BLOOD  Result Value Ref Range Status   Specimen Description BLOOD LEFT ASSIST CONTROL  Final   Special Requests   Final    BOTTLES DRAWN AEROBIC AND ANAEROBIC Blood Culture adequate volume   Culture   Final    NO GROWTH 2 DAYS Performed at [facility], [address], [place], [place] [address]    Report Status PENDING  Incomplete  Resp Panel by RT-PCR (Flu A&B, Covid) Nasopharyngeal Swab     Status: Abnormal   Collection Time: 01/28/22  6:57 AM   Specimen: Nasopharyngeal Swab; Nasopharyngeal(NP) swabs in vial transport medium  Result Value Ref Range Status   SARS Coronavirus 2 by RT PCR POSITIVE (A) NEGATIVE Final    Comment: (NOTE) SARS-CoV-2 target nucleic acids are DETECTED.  The SARS-CoV-2 RNA is generally detectable in upper respiratory specimens during the acute phase of infection. Positive results are indicative of the presence of the identified virus, but do not rule out bacterial infection or co-infection with other pathogens not detected by the test. Clinical correlation with patient history and other diagnostic information is necessary to determine patient infection status. The expected result is Negative.  Fact Sheet for Patients: BloggerCourse.com  Fact Sheet for Healthcare Providers: SeriousBroker.it  This test is not yet approved or cleared by the [place] FDA and  has been authorized for detection and/or diagnosis of SARS-CoV-2 by FDA under an Emergency Use Authorization (EUA).  This EUA will remain in effect (meaning this test can be used) for the duration of  the COVID-19 declaration under Section 564(b)(1) of the A ct, 21 U.S.C. section 360bbb-3(b)(1), unless the authorization is terminated or revoked sooner.     Influenza A by PCR NEGATIVE NEGATIVE Final   Influenza B by PCR NEGATIVE NEGATIVE Final    Comment:  (NOTE) The Xpert Xpress SARS-CoV-2/FLU/RSV plus assay is intended as an aid in the diagnosis of influenza from Nasopharyngeal swab specimens and should not be used as a sole basis for treatment. Nasal washings and aspirates are unacceptable for Xpert Xpress SARS-CoV-2/FLU/RSV testing.  Fact Sheet for Patients: BloggerCourse.com  Fact Sheet for Healthcare Providers: SeriousBroker.it  This test is not yet approved or cleared by the [place] FDA and has been authorized for detection and/or diagnosis of SARS-CoV-2 by FDA under an Emergency Use Authorization (EUA). This EUA will remain in effect (meaning this test can be used) for the duration of the COVID-19 declaration under Section 564(b)(1) of the Act, 21 U.S.C. section 360bbb-3(b)(1), unless the authorization is terminated or revoked.  Performed at [facility], [address] [place], [place] [address]      Total time spend on discharging this patient, including the last patient exam, discussing the hospital stay, instructions for ongoing care as it relates to all pertinent caregivers, as well as preparing the medical discharge records, prescriptions, and/or referrals as applicable, is 35 minutes.    Darlin Priestly, MD  [facility] 01/30/2022, 10:20 AM

## 2022-02-02 LAB — CULTURE, BLOOD (ROUTINE X 2)
Culture: NO GROWTH
Culture: NO GROWTH
Special Requests: ADEQUATE

## 2022-02-03 NOTE — Progress Notes (Signed)
Cath Change/ Replacement  Patient is present today for a catheter change due to urinary retention.  18 ml of water was removed from the balloon, a 16 FR foley cath was removed with out difficulty.  Patient was cleaned and prepped in a sterile fashion with betadine. A 18 FR foley cath was replaced into the bladder no complications were noted Urine return was noted 20 ml and urine was yellow in color. The balloon was filled with ml of sterile water. A leg bag was attached for drainage.  A night bag was also given to the patient and patient was given instruction on how to change from one bag to another. Patient was given proper instruction on catheter care.    Performed by: Zara Council, PA-C   Follow up: Return in about 1 month (around 03/07/2022) for Foley exchange .  RUS and cysto 09/2022 with Dr. Colin Mulders Littlejohn,acting as a scribe for [facility], PA-C.,have documented all relevant documentation on the behalf of Adrienne Perusse, PA-C,as directed by  [facility], PA-C while in the presence of [name], PA-C.

## 2022-02-07 ENCOUNTER — Encounter: Payer: Self-pay | Admitting: Urology

## 2022-02-07 ENCOUNTER — Other Ambulatory Visit: Payer: Self-pay

## 2022-02-07 ENCOUNTER — Ambulatory Visit: Payer: Medicare Other | Admitting: Urology

## 2022-02-07 VITALS — BP 133/76 | HR 78 | Ht 65.0 in | Wt 200.0 lb

## 2022-02-07 DIAGNOSIS — Z978 Presence of other specified devices: Secondary | ICD-10-CM

## 2022-03-04 NOTE — Progress Notes (Signed)
Cath Change/ Replacement ? ?Patient is present today for a catheter change due to urinary retention.  12 ml of water was removed from the balloon, a 18 FR foley cath was removed with out difficulty.  Patient was cleaned and prepped in a sterile fashion with betadine. A 18 silastic  FR foley cath was replaced into the bladder no complications were noted Urine return was noted 20 ml and urine was yellow in color. The balloon was filled with 48ml of sterile water. A  bag was attached for drainage.  A night bag was also given to the patient and patient was given instruction on how to change from one bag to another. Patient was given proper instruction on catheter care.   ? ?Performed by: Zara Council, PA-C  ? ?Follow up: One month for Foley catheter exchange  ? ?I,Kailey Littlejohn,acting as a Education administrator for [employer], PA-C.,have documented all relevant documentation on the behalf of [place], PA-C,as directed by  [facility], PA-C while in the presence of [place], PA-C. ? ?I have reviewed the above documentation for accuracy and completeness, and I agree with the above.   ? ?Zara Council, PA-C  ?

## 2022-03-07 ENCOUNTER — Encounter: Payer: Self-pay | Admitting: Urology

## 2022-03-07 ENCOUNTER — Other Ambulatory Visit: Payer: Self-pay

## 2022-03-07 ENCOUNTER — Ambulatory Visit: Payer: Medicare Other | Admitting: Urology

## 2022-03-07 VITALS — Ht 65.0 in | Wt 200.0 lb

## 2022-03-07 DIAGNOSIS — Z978 Presence of other specified devices: Secondary | ICD-10-CM

## 2022-04-10 NOTE — Progress Notes (Signed)
Cath Change/ Replacement ? ?Patient is present today for a catheter change due to urinary retention.  16 ml of water was removed from the balloon, a 18 silastic  FR foley cath was removed with out difficulty.  Patient was cleaned and prepped in a sterile fashion with betadine. A 18  FR foley cath was replaced into the bladder no complications were noted Urine return was noted 10 ml and urine was yellow in color. The balloon was filled with 24ml of sterile water. A  bag was attached for drainage.  A night bag was also given to the patient and patient was given instruction on how to change from one bag to another. Patient was given proper instruction on catheter care.   ? ?Performed by: Michiel Cowboy, PA-C  ? ?Follow up: One month for Foley catheter exchange  ? ?

## 2022-04-11 ENCOUNTER — Encounter: Payer: Self-pay | Admitting: Urology

## 2022-04-11 ENCOUNTER — Ambulatory Visit: Payer: Medicare Other | Admitting: Urology

## 2022-04-11 VITALS — BP 128/78 | HR 75 | Ht 65.0 in | Wt 193.0 lb

## 2022-04-11 DIAGNOSIS — Z978 Presence of other specified devices: Secondary | ICD-10-CM

## 2022-05-16 ENCOUNTER — Ambulatory Visit: Payer: Medicare Other | Admitting: Urology

## 2022-05-16 ENCOUNTER — Encounter: Payer: Self-pay | Admitting: Urology

## 2022-05-16 DIAGNOSIS — R339 Retention of urine, unspecified: Secondary | ICD-10-CM | POA: Diagnosis not present

## 2022-05-16 DIAGNOSIS — Z978 Presence of other specified devices: Secondary | ICD-10-CM

## 2022-05-16 NOTE — Progress Notes (Signed)
Cath Change/ Replacement Patient is present today for a catheter change due to urinary retention.  13ml of water was removed from the balloon, a 18 FR silastic  foley cath was removed with out difficulty.  Patient was cleaned and prepped in a sterile fashion with betadine. A 18  FR silastic foley cath was replaced into the bladder no complications were noted Urine return was noted 23ml and urine was  in color. The balloon was filled with 70ml of sterile water. A  bag was attached for drainage.  A night bag was also given to the patient and patient was given instruction on how to change from one bag to another. Patient was given proper instruction on catheter care.    Performed by: Michiel Cowboy, PA-C   Follow up: One month for Foley catheter exchange     I,Kailey Littlejohn,acting as a scribe for [employer], PA-C.,have documented all relevant documentation on the behalf of Adrienne Derrick, PA-C,as directed by  [facility], PA-C while in the presence of Aleksandar Duve, PA-C.

## 2022-06-13 ENCOUNTER — Ambulatory Visit: Payer: Medicare Other | Admitting: Urology

## 2022-06-13 DIAGNOSIS — Z978 Presence of other specified devices: Secondary | ICD-10-CM

## 2022-06-13 DIAGNOSIS — R339 Retention of urine, unspecified: Secondary | ICD-10-CM

## 2022-06-13 NOTE — Progress Notes (Signed)
Cath Change/ Replacement  Patient is present today for a catheter change due to urinary retention.  19 ml of water was removed from the balloon, a 18 FR silastic foley cath was removed with out difficulty.  Patient was cleaned and prepped in a sterile fashion with betadine. A 18 FR silastic foley cath was replaced into the bladder no complications were noted Urine return was noted 100 ml and urine was yellow/clear in color. The balloon was filled with 41ml of sterile water. A leg bag was attached for drainage.  A night bag was also given to the patient and patient was given instruction on how to change from one bag to another. Patient was given proper instruction on catheter care.    Performed by: Michiel Cowboy, PA-C   Follow up: Return in 1 month for cath exchange   I,Kailey Littlejohn,acting as a scribe for [facility], PA-C.,have documented all relevant documentation on the behalf of Ely Ballen, PA-C,as directed by  [facility], PA-C while in the presence of Latania Bascomb, PA-C.  I have reviewed the above documentation for accuracy and completeness, and I agree with the above.    Michiel Cowboy, PA-C

## 2022-07-10 NOTE — Progress Notes (Incomplete)
Cath Change/ Replacement ? ?Patient is present today for a catheter change due to urinary retention.  ***ml of water was removed from the balloon, a ***FR foley cath was removed with out difficulty.  Patient was cleaned and prepped in a sterile fashion with betadine. A *** FR foley cath was replaced into the bladder {dnt complications:20057} Urine return was noted ***ml and urine was *** in color. The balloon was filled with 10ml of sterile water. A *** bag was attached for drainage.  A night bag was also given to the patient and patient was given instruction on how to change from one bag to another. Patient was given proper instruction on catheter care.   ? ?Performed by: *** ? ?Follow up: *** ? ?I,Kailey Littlejohn,acting as a scribe for SHANNON MCGOWAN, PA-C.,have documented all relevant documentation on the behalf of SHANNON MCGOWAN, PA-C,as directed by  SHANNON MCGOWAN, PA-C while in the presence of SHANNON MCGOWAN, PA-C. ? ?

## 2022-07-11 ENCOUNTER — Ambulatory Visit: Payer: Medicare Other | Admitting: Urology

## 2022-07-14 ENCOUNTER — Encounter: Payer: Self-pay | Admitting: Physician Assistant

## 2022-07-14 ENCOUNTER — Ambulatory Visit (INDEPENDENT_AMBULATORY_CARE_PROVIDER_SITE_OTHER): Payer: Medicare Other | Admitting: Physician Assistant

## 2022-07-14 DIAGNOSIS — R339 Retention of urine, unspecified: Secondary | ICD-10-CM

## 2022-07-14 DIAGNOSIS — Z978 Presence of other specified devices: Secondary | ICD-10-CM

## 2022-07-14 NOTE — Progress Notes (Signed)
Cath Change/ Replacement  Patient is present today for a catheter change due to urinary retention.  74ml of water was removed from the balloon, a 18FR Silastic foley cath was removed without difficulty.  Patient was cleaned and prepped in a sterile fashion with betadine. A 18 FR Silastic foley cath was replaced into the bladder no complications were noted Urine return was noted 12ml and urine was clear in color. The balloon was filled with 35ml of sterile water. A leg bag was attached for drainage.  A night bag was also given to the patient.  Patient tolerated well.  Performed by: Carman Ching, PA-C    Follow up: Return in about 4 weeks (around 08/11/2022) for Catheter exchange.

## 2022-07-18 ENCOUNTER — Ambulatory Visit: Payer: Medicare Other | Admitting: Urology

## 2022-08-15 ENCOUNTER — Ambulatory Visit: Payer: Medicare Other | Admitting: Urology

## 2022-09-04 NOTE — Progress Notes (Unsigned)
Cath Change/ Replacement  Patient is present today for a catheter change due to urinary retention.  18 ml of water was removed from the balloon, a 18 FR Silastic foley cath was removed without difficulty.  Patient was cleaned and prepped in a sterile fashion with betadine and 2% lidocaine jelly was instilled into the urethra. A 18 FR Silastic foley cath was replaced into the bladder, no complications were noted. Urine return was noted 20 ml and urine was yellow in color. The balloon was filled with 40ml of sterile water. A leg bag was attached for drainage.  A night bag was also given to the patient and patient was given instruction on how to change from one bag to another. Patient was given proper instruction on catheter care.    Performed by: Michiel Cowboy, PA-C and Luther Hearing, CMA  Follow up: Return in about 1 month for Foley exchange, RUS and cysto with Dr. Apolinar Junes

## 2022-09-05 ENCOUNTER — Ambulatory Visit: Payer: Medicare Other | Admitting: Urology

## 2022-09-05 ENCOUNTER — Encounter: Payer: Self-pay | Admitting: Urology

## 2022-09-05 ENCOUNTER — Telehealth: Payer: Self-pay | Admitting: Urology

## 2022-09-05 VITALS — BP 137/72 | HR 64 | Ht 65.0 in | Wt 188.0 lb

## 2022-09-05 DIAGNOSIS — R339 Retention of urine, unspecified: Secondary | ICD-10-CM | POA: Diagnosis not present

## 2022-09-05 DIAGNOSIS — Z978 Presence of other specified devices: Secondary | ICD-10-CM

## 2022-09-05 DIAGNOSIS — N390 Urinary tract infection, site not specified: Secondary | ICD-10-CM

## 2022-09-05 NOTE — Telephone Encounter (Signed)
Pt called stating she could not do the 9/29 CYSTO appt, also, pt can not do Wednesday appt. She said another Dr wanted to see her??  Pt could not remember what that was.  Please advise.  [phone]

## 2022-09-08 ENCOUNTER — Other Ambulatory Visit: Payer: Medicare Other | Admitting: Urology

## 2022-09-12 ENCOUNTER — Ambulatory Visit: Payer: Medicare Other | Admitting: Urology

## 2022-09-21 NOTE — Telephone Encounter (Signed)
LMOM X2 that pt need to reschedule with Dr. Erlene Quan, pt has never seen Dr. Diamantina Providence.  Marland Kitchenleft message to have patient return my call.

## 2022-09-22 ENCOUNTER — Telehealth: Payer: Self-pay | Admitting: Urology

## 2022-09-22 ENCOUNTER — Ambulatory Visit
Admission: RE | Admit: 2022-09-22 | Discharge: 2022-09-22 | Disposition: A | Discharge status: Home / Self Care | Payer: Medicare Other | Source: Ambulatory Visit | Attending: Urology | Admitting: Urology

## 2022-09-22 DIAGNOSIS — N390 Urinary tract infection, site not specified: Secondary | ICD-10-CM | POA: Insufficient documentation

## 2022-09-22 NOTE — Telephone Encounter (Signed)
Spoke with patient regarding her upcoming RUS and appt with Dr. Erlene Quan. Advised patient not to CX her RUS and that she needed to keep that appt for 09/22/22. Patient voiced understanding. Also resched her appt with Dr. Erlene Quan to 09/30/22 in [place] for her RUS results, Cysto and monthly foley change. Patient was given appt details and she voiced understanding. Sharyn Lull

## 2022-09-23 ENCOUNTER — Ambulatory Visit: Payer: Medicare Other | Admitting: Urology

## 2022-09-30 ENCOUNTER — Ambulatory Visit: Payer: Medicare Other | Admitting: Urology

## 2022-09-30 NOTE — Progress Notes (Signed)
10/03/2022 9:48 AM   Adrienne Strickland Apr 28, 1961 [id number]  Referring provider: Jenell Milliner, MD No address on file  Urological history: 1.  Neurogenic bladder -Contributing factors of multiple sclerosis -Failed Botox -Could not tolerate suprapubic tube -cysto (09/2021) -mild erythema consistent with chronic catheter use and mild trabeculation -Managed with chronic indwelling Foley - RUS (09/2022) - no hydro  Chief Complaint  Patient presents with   Procedure    Catheter change    HPI: Adrienne Strickland is a 61 y.o. female who presents today for RUS report and Foley catheter exchange.   She has not had any issues with the catheter since last visit.  Patient denies any modifying or aggravating factors.  Patient denies any gross hematuria, dysuria or suprapubic/flank pain.  Patient denies any fevers, chills, nausea or vomiting.    PMH: Past Medical History:  Diagnosis Date   Abnormality of gait 09/26/2013   Arthritis    Duodenal ulcer 02/27/2015   Memory difficulty    Multiple sclerosis (HCC) 09/26/2013   Multiple sclerosis (HCC)    Neurogenic bladder    self caths   Urinary frequency     Surgical History: Past Surgical History:  Procedure Laterality Date   BOTOX INJECTION N/A 12/13/2017   Procedure: BOTOX INJECTION;  Surgeon: Vanna Scotland, MD;  Location: [facility] ORS;  Service: Urology;  Laterality: N/A;   CHOLECYSTECTOMY     COLONOSCOPY WITH PROPOFOL N/A 05/22/2018   Procedure: COLONOSCOPY WITH PROPOFOL;  Surgeon: Christena Deem, MD;  Location: [facility] ENDOSCOPY;  Service: Endoscopy;  Laterality: N/A;   CYSTOSCOPY N/A 12/13/2017   Procedure: CYSTOSCOPY;  Surgeon: Vanna Scotland, MD;  Location: [facility] ORS;  Service: Urology;  Laterality: N/A;   GALLBLADDER SURGERY      Home Medications:  Allergies as of 10/03/2022       Reactions   Sulfa Antibiotics Nausea And Vomiting   Tetanus Toxoids Swelling   Extreme swelling at injection site        Medication List         Accurate as of October 03, 2022  9:48 AM. If you have any questions, ask your nurse or doctor.          baclofen 10 MG tablet Commonly known as: LIORESAL Take 1 tablet by mouth 2 (two) times daily.   cyanocobalamin 1000 MCG tablet Commonly known as: VITAMIN B12 Take 1,000 mcg by mouth daily.   dalfampridine 10 MG Tb12 Take 1 tablet by mouth twice daily 12 hours apart as directed by physician   Dimethyl Fumarate 240 MG Cpdr Take 240 mg by mouth at bedtime.   Dimethyl Fumarate 120 & 240 MG Misc Take 240 mg once a day   donepezil 10 MG tablet Commonly known as: ARICEPT Take 10 mg by mouth 2 (two) times daily.   fluticasone 50 MCG/ACT nasal spray Commonly known as: FLONASE Place 2 sprays into both nostrils daily.   memantine 10 MG tablet Commonly known as: NAMENDA Take 1 tablet by mouth 2 (two) times daily.   oxyCODONE 5 MG immediate release tablet Commonly known as: Oxy IR/ROXICODONE Take 5-10 mg by mouth every 6 (six) hours as needed for pain. Take 2 tablets for first AM dose.   Vitamin D-3 25 MCG (1000 UT) Caps Take 1,000 Units by mouth daily.        Allergies:  Allergies  Allergen Reactions   Sulfa Antibiotics Nausea And Vomiting   Tetanus Toxoids Swelling    Extreme swelling at injection  site    Family History: Family History  Problem Relation Age of Onset   Seizures Mother    Emphysema Father    Heart disease Brother    Kidney disease Neg Hx    Prostate cancer Neg Hx    Bladder Cancer Neg Hx     Social History:  reports that she has never smoked. She has never been exposed to tobacco smoke. She has never used smokeless tobacco. She reports current alcohol use. She reports that she does not use drugs.  ROS: Pertinent ROS in HPI  Physical Exam: Ht 5\' 5"  (1.651 m)   LMP 11/23/2018 (Approximate)   BMI 31.28 kg/m   Constitutional:  Well nourished. Alert and oriented, No acute distress. HEENT: [place] AT, moist mucus membranes.  Trachea  midline, no masses. Cardiovascular: No clubbing, cyanosis, or edema. Respiratory: Normal respiratory effort, no increased work of breathing. GU: No CVA tenderness.  No bladder fullness or masses.  Atrophic external genitalia,  Neurologic: Grossly intact, no focal deficits, moving all 4 extremities. Psychiatric: Normal mood and affect.    Laboratory Data: Lab Results  Component Value Date   WBC 9.2 01/30/2022   HGB 11.5 (L) 01/30/2022   HCT 35.0 (L) 01/30/2022   MCV 88.2 01/30/2022   PLT 255 01/30/2022    Lab Results  Component Value Date   CREATININE 0.92 01/30/2022    Lab Results  Component Value Date   AST 22 01/28/2022   Lab Results  Component Value Date   ALT 18 01/28/2022    Urinalysis    Component Value Date/Time   COLORURINE YELLOW 01/28/2022 0443   APPEARANCEUR CLOUDY (A) 01/28/2022 0443   APPEARANCEUR Cloudy (A) 06/30/2021 1020   LABSPEC 1.010 01/28/2022 0443   PHURINE 6.0 01/28/2022 0443   GLUCOSEU NEGATIVE 01/28/2022 0443   HGBUR LARGE (A) 01/28/2022 0443   BILIRUBINUR NEGATIVE 01/28/2022 0443   BILIRUBINUR Negative 06/30/2021 1020   KETONESUR NEGATIVE 01/28/2022 0443   PROTEINUR >300 (A) 01/28/2022 0443   NITRITE POSITIVE (A) 01/28/2022 0443   LEUKOCYTESUR LARGE (A) 01/28/2022 0443  I have reviewed the labs.   Pertinent Imaging: CLINICAL DATA:  History of recurrent UTI   EXAM: RENAL / URINARY TRACT ULTRASOUND COMPLETE   COMPARISON:  September 27, 2021   FINDINGS: Right Kidney:   Renal measurements: 10.7 x 4.5 x 4.8 cm = volume: 121 mL. Echogenicity within normal limits. No mass or hydronephrosis visualized.   Left Kidney:   Renal measurements: 11.5 x 5.1 x 4.8 cm = volume: 146 mL. Echogenicity within normal limits. No mass or hydronephrosis visualized.   Bladder:   Decompressed around a Foley catheter.   Other:   None.   IMPRESSION: No hydronephrosis.     Electronically Signed   By: September 29, 2021 M.D.   On: 09/22/2022  16:50  I have independently reviewed the films.    Cath Change/ Replacement  Patient is present today for a catheter change due to urinary retention.  18 ml of water was removed from the balloon, a 18 FR Silastic foley cath was removed without difficulty.  Patient was cleaned and prepped in a sterile fashion with betadine and 2% lidocaine jelly was instilled into the urethra. A 18  Silastic FR foley cath was replaced into the bladder, no complications were noted. Urine return was noted 10 ml and urine was yellow clear in color. The balloon was filled with 86ml of sterile water. A leg bag was attached for drainage.  A night bag  was also given to the patient and patient was given instruction on how to change from one bag to another. Patient was given proper instruction on catheter care.    Performed by: Zara Council, PA-C   Assessment & Plan:    1. Neurogenic bladder -RUS demonstrated no hydro -catheter exchange today -cysto in 09/2023  Return in about 1 month (around 11/03/2022) for Foley catheter exchange .  These notes generated with voice recognition software. I apologize for typographical errors.  [place] [place] [address]  [place], [place] [address] [phone]

## 2022-10-03 ENCOUNTER — Ambulatory Visit: Payer: Medicare Other | Admitting: Urology

## 2022-10-03 ENCOUNTER — Encounter: Payer: Self-pay | Admitting: Urology

## 2022-10-03 VITALS — Ht 65.0 in

## 2022-10-03 DIAGNOSIS — Z978 Presence of other specified devices: Secondary | ICD-10-CM

## 2022-10-03 DIAGNOSIS — N319 Neuromuscular dysfunction of bladder, unspecified: Secondary | ICD-10-CM

## 2022-10-03 DIAGNOSIS — R339 Retention of urine, unspecified: Secondary | ICD-10-CM | POA: Diagnosis not present

## 2022-10-04 ENCOUNTER — Ambulatory Visit: Payer: Medicare Other | Admitting: Urology

## 2022-11-04 NOTE — Progress Notes (Unsigned)
Cath Change/ Replacement  Patient is present today for a catheter change due to urinary retention.  17 ml of water was removed from the balloon, a 18 FR Silastic foley cath was removed without difficulty.  Patient was cleaned and prepped in a sterile fashion with betadine and 2% lidocaine jelly was instilled into the urethra. A 18 FR Silastic foley cath was replaced into the bladder, no complications were noted. Urine return was noted 20 ml and urine was yellow in color. The balloon was filled with 10ml of sterile water. A leg bag was attached for drainage.  A night bag was also given to the patient and patient was given instruction on how to change from one bag to another. Patient was given proper instruction on catheter care.    Performed by: Michiel Cowboy, PA-C and Harlin Heys, CMA   Follow up: One month for Foley catheter exchange

## 2022-11-07 ENCOUNTER — Encounter: Payer: Self-pay | Admitting: Urology

## 2022-11-07 ENCOUNTER — Ambulatory Visit: Payer: Medicare Other | Admitting: Urology

## 2022-11-07 VITALS — BP 143/81 | HR 70 | Ht 65.0 in | Wt 187.0 lb

## 2022-11-07 DIAGNOSIS — R339 Retention of urine, unspecified: Secondary | ICD-10-CM

## 2022-11-07 DIAGNOSIS — Z978 Presence of other specified devices: Secondary | ICD-10-CM

## 2022-12-02 NOTE — Progress Notes (Unsigned)
Cath Change/ Replacement  Patient is present today for a catheter change due to urinary retention.  18 ml of water was removed from the balloon, a 18 FR Silastic foley cath was removed without difficulty.  Patient was cleaned and prepped in a sterile fashion with betadine and 2% lidocaine jelly was instilled into the urethra. A 18 FR foley cath was replaced into the bladder, no complications were noted. Urine return was noted 20 ml and urine was yellow in color. The balloon was filled with 10ml of sterile water. A leg bag was attached for drainage.  A night bag was also given to the patient and patient was given instruction on how to change from one bag to another. Patient was given proper instruction on catheter care.    Performed by: Michiel Cowboy, PA-C and Luther Hearing, CMA  Follow up: One month for Foley exchange

## 2022-12-05 ENCOUNTER — Encounter: Payer: Self-pay | Admitting: Urology

## 2022-12-05 ENCOUNTER — Ambulatory Visit: Payer: Medicare Other | Admitting: Urology

## 2022-12-05 VITALS — BP 128/76 | HR 67 | Ht 65.0 in | Wt 193.8 lb

## 2022-12-05 DIAGNOSIS — Z978 Presence of other specified devices: Secondary | ICD-10-CM | POA: Diagnosis not present

## 2022-12-05 DIAGNOSIS — R339 Retention of urine, unspecified: Secondary | ICD-10-CM

## 2023-01-06 NOTE — Progress Notes (Signed)
Cath Change/ Replacement  Patient is present today for a catheter change due to urinary retention.  18 ml of water was removed from the balloon, a 18 FR foley cath was removed without difficulty.  Patient was cleaned and prepped in a sterile fashion.  A 18 FR foley cath was replaced into the bladder, no complications were noted. Urine return was noted 10 ml and urine was yellow in color. The balloon was filled with 20ml of sterile water. A leg bag was attached for drainage.  A night bag was also given to the patient and patient was given instruction on how to change from one bag to another. Patient was given proper instruction on catheter care.    Performed by: Michiel Cowboy PA-C and Luther Hearing, CMA  Follow up: One month for Foley catheter exchange

## 2023-01-09 ENCOUNTER — Ambulatory Visit: Payer: Medicare Other | Admitting: Urology

## 2023-01-09 ENCOUNTER — Encounter: Payer: Self-pay | Admitting: Urology

## 2023-01-09 VITALS — BP 124/62 | HR 59 | Ht 65.0 in | Wt 204.0 lb

## 2023-01-09 DIAGNOSIS — R339 Retention of urine, unspecified: Secondary | ICD-10-CM

## 2023-01-09 DIAGNOSIS — Z978 Presence of other specified devices: Secondary | ICD-10-CM

## 2023-02-13 ENCOUNTER — Ambulatory Visit: Payer: Medicare Other | Admitting: Urology

## 2023-02-13 VITALS — BP 147/75 | HR 66

## 2023-02-13 DIAGNOSIS — R339 Retention of urine, unspecified: Secondary | ICD-10-CM

## 2023-02-13 DIAGNOSIS — Z978 Presence of other specified devices: Secondary | ICD-10-CM

## 2023-02-13 NOTE — Progress Notes (Signed)
Cath Change/ Replacement  Patient is present today for a catheter change due to urinary retention.  18 ml of water was removed from the balloon, a 18 FR Silastic foley cath was removed without difficulty.  Patient was cleaned and prepped in a sterile fashion with betadine. A 18 FR Silastic foley cath was replaced into the bladder, no complications were noted. Urine return was noted 50 ml and urine was yellow in color. The balloon was filled with 75m of sterile water. A leg bag was attached for drainage.  A night bag was also given to the patient and patient was given instruction on how to change from one bag to another. Patient was given proper instruction on catheter care.    Performed by: SZara Council PA-C and ODarrick Grinder CMA  Follow up: One month for catheter exchange

## 2023-03-10 NOTE — Progress Notes (Unsigned)
Cath Change/ Replacement  Patient is present today for a catheter change due to urinary retention.  19 ml of water was removed from the balloon, a 18 FR Silastic foley cath was removed without difficulty.  Patient was cleaned and prepped in a sterile fashion with betadine.  A 18 FR Silastic foley cath was replaced into the bladder, no complications were noted. Urine return was noted 20 ml and urine was yellow in color. The balloon was filled with 20 ml of sterile water. A leg bag was attached for drainage.  A night bag was also given to the patient and patient was given instruction on how to change from one bag to another. Patient was given proper instruction on catheter care.    Performed by: Zara Council, PA-C and Kyra Manges, CMA   Follow up: One month for Foley catheter exchange

## 2023-03-13 ENCOUNTER — Ambulatory Visit (INDEPENDENT_AMBULATORY_CARE_PROVIDER_SITE_OTHER): Payer: Medicare Other | Admitting: Urology

## 2023-03-13 DIAGNOSIS — Z978 Presence of other specified devices: Secondary | ICD-10-CM | POA: Diagnosis not present

## 2023-03-13 DIAGNOSIS — R339 Retention of urine, unspecified: Secondary | ICD-10-CM | POA: Diagnosis not present

## 2023-04-02 NOTE — Progress Notes (Unsigned)
04/03/2023 2:31 PM   Adrienne Strickland 25-Jun-1961 [id number]  Referring provider: Jenell Milliner, MD No address on file  Urological history: 1. Neurogenic bladder -Contributing factors of multiple sclerosis -Failed Botox -Could not tolerate suprapubic tube -cysto (09/2021) -mild erythema consistent with chronic catheter use and mild trabeculation -RUS (2023) - no hydro -Managed with chronic indwelling Foley  Chief Complaint  Patient presents with   Neurogenic Bladder     HPI: Adrienne Strickland is a 62 y.o. female who presents today for "cath is messed up" with irritation and leakage.  She had an uneventful catheter exchange on 03/13/2023.    She has been having urine leaking around the catheter into her briefs on and off for the last week.  She denied any blood in the urine or cloudy looking urine.  She has not had any suprapubic pain, fever, chills, nausea or vomiting.  UA straw hazy, specific gravity 1.010, pH 6.5, moderate heme, moderate leukocyte, 0-5 squamous epithelial cells, 21-50 WBCs, 6-10 RBCs, many bacteria and WBC clumps are present.  Urine culture pending.  PMH: Past Medical History:  Diagnosis Date   Abnormality of gait 09/26/2013   Arthritis    Duodenal ulcer 02/27/2015   Memory difficulty    Multiple sclerosis (HCC) 09/26/2013   Multiple sclerosis (HCC)    Neurogenic bladder    self caths   Urinary frequency     Surgical History: Past Surgical History:  Procedure Laterality Date   BOTOX INJECTION N/A 12/13/2017   Procedure: BOTOX INJECTION;  Surgeon: Vanna Scotland, MD;  Location: [facility] ORS;  Service: Urology;  Laterality: N/A;   CHOLECYSTECTOMY     COLONOSCOPY WITH PROPOFOL N/A 05/22/2018   Procedure: COLONOSCOPY WITH PROPOFOL;  Surgeon: Christena Deem, MD;  Location: [facility] ENDOSCOPY;  Service: Endoscopy;  Laterality: N/A;   CYSTOSCOPY N/A 12/13/2017   Procedure: CYSTOSCOPY;  Surgeon: Vanna Scotland, MD;  Location: [facility] ORS;  Service: Urology;   Laterality: N/A;   GALLBLADDER SURGERY      Home Medications:  Allergies as of 04/03/2023       Reactions   Sulfa Antibiotics Nausea And Vomiting   Tetanus Toxoids Swelling   Extreme swelling at injection site        Medication List        Accurate as of April 03, 2023  2:31 PM. If you have any questions, ask your nurse or doctor.          baclofen 10 MG tablet Commonly known as: LIORESAL Take 1 tablet by mouth 2 (two) times daily.   cyanocobalamin 1000 MCG tablet Commonly known as: VITAMIN B12 Take 1,000 mcg by mouth daily.   dalfampridine 10 MG Tb12 Take 1 tablet by mouth twice daily 12 hours apart as directed by physician   Dimethyl Fumarate 120 & 240 MG Misc Take 240 mg once a day   donepezil 10 MG tablet Commonly known as: ARICEPT Take 10 mg by mouth 2 (two) times daily.   fluticasone 50 MCG/ACT nasal spray Commonly known as: FLONASE Place 2 sprays into both nostrils daily.   gabapentin 100 MG capsule Commonly known as: NEURONTIN Take 100 mg by mouth.   memantine 10 MG tablet Commonly known as: NAMENDA Take 1 tablet by mouth 2 (two) times daily.   oxyCODONE 5 MG immediate release tablet Commonly known as: Oxy IR/ROXICODONE Take one tablet by mouth every 6 hours as needed for pain.  Take 2 tablets for first AM dose.   Vitamin D-3  25 MCG (1000 UT) Caps Take 1,000 Units by mouth daily.        Allergies:  Allergies  Allergen Reactions   Sulfa Antibiotics Nausea And Vomiting   Tetanus Toxoids Swelling    Extreme swelling at injection site    Family History: Family History  Problem Relation Age of Onset   Seizures Mother    Emphysema Father    Heart disease Brother    Kidney disease Neg Hx    Prostate cancer Neg Hx    Bladder Cancer Neg Hx     Social History:  reports that she has never smoked. She has never been exposed to tobacco smoke. She has never used smokeless tobacco. She reports current alcohol use. She reports that she does  not use drugs.  ROS: Pertinent ROS in HPI  Physical Exam: BP 138/62 (BP Location: Left Arm, Patient Position: Sitting, Cuff Size: Normal)   Pulse 75   LMP 11/23/2018 (Approximate)   Constitutional:  Well nourished. Alert and oriented, No acute distress. HEENT: [place] AT, moist mucus membranes.  Trachea midline Cardiovascular: No clubbing, cyanosis, or edema. Respiratory: Normal respiratory effort, no increased work of breathing. Neurologic: Grossly intact, no focal deficits, moving all 4 extremities. Psychiatric: Normal mood and affect.  Laboratory Data: Serum creatinine (01/2023) - 1.0 I have reviewed the labs.   Pertinent Imaging: N/A  Cath Change/ Replacement Patient is present today for a catheter change due to urinary retention.  19 ml of water was removed from the balloon, a 18 FR Silastic foley cath was removed without difficulty.  The catheter itself was noted to be full of sediment.  Patient was cleaned and prepped in a sterile fashion with betadine.  A 18 FR Silastic foley cath was replaced into the bladder, no complications were noted. Urine return was noted 50 ml and urine was pale yellow in color. The balloon was filled with 23ml of sterile water. A leg bag was attached for drainage.  A night bag was also given to the patient and patient was given instruction on how to change from one bag to another. Patient was given proper instruction on catheter care.    Performed by: Myself and Luther Hearing, CMA  Assessment & Plan:    1. Problems with Foley -Catheter exchanged at this visit -Spasms may be due to either sediment buildup within the catheter lumen or the 20 mL catheter balloon -I only put 10 mL in the catheter balloon today -They will continue at home irrigations -They will follow-up as scheduled in 2 weeks for Foley catheter exchange and we will again survey the catheter for sediment buildup -UA consistent with chronic Foley -urine culture is pending, if she develops  UTI symptoms in the interim we will treat  Return for as scheduled .  These notes generated with voice recognition software. I apologize for typographical errors.  Cloretta Ned  [facility] [address]  [address] [place], [place] [address] [phone]

## 2023-04-03 ENCOUNTER — Encounter: Payer: Self-pay | Admitting: Urology

## 2023-04-03 ENCOUNTER — Ambulatory Visit (INDEPENDENT_AMBULATORY_CARE_PROVIDER_SITE_OTHER): Payer: Medicare Other | Admitting: Urology

## 2023-04-03 ENCOUNTER — Other Ambulatory Visit
Admission: RE | Admit: 2023-04-03 | Discharge: 2023-04-03 | Disposition: A | Discharge status: Home / Self Care | Payer: Medicare Other | Attending: Urology | Admitting: Urology

## 2023-04-03 ENCOUNTER — Other Ambulatory Visit: Payer: Self-pay

## 2023-04-03 VITALS — BP 138/62 | HR 75

## 2023-04-03 DIAGNOSIS — T839XXA Unspecified complication of genitourinary prosthetic device, implant and graft, initial encounter: Secondary | ICD-10-CM

## 2023-04-03 DIAGNOSIS — N390 Urinary tract infection, site not specified: Secondary | ICD-10-CM | POA: Insufficient documentation

## 2023-04-03 LAB — URINALYSIS, COMPLETE (UACMP) WITH MICROSCOPIC
Bilirubin Urine: NEGATIVE
Glucose, UA: NEGATIVE mg/dL
Ketones, ur: NEGATIVE mg/dL
Nitrite: NEGATIVE
Protein, ur: NEGATIVE mg/dL
Specific Gravity, Urine: 1.01 (ref 1.005–1.030)
pH: 6.5 (ref 5.0–8.0)

## 2023-04-06 ENCOUNTER — Telehealth: Payer: Self-pay | Admitting: Family Medicine

## 2023-04-06 LAB — URINE CULTURE: Culture: 80000 — AB

## 2023-04-06 MED ORDER — CEFUROXIME AXETIL 500 MG PO TABS: 500.0000 mg | ORAL_TABLET | Freq: Two times a day (BID) | ORAL | 0 refills | Status: DC

## 2023-04-06 NOTE — Telephone Encounter (Signed)
-----   Message from Harle Battiest, PA-C sent at 04/06/2023 11:05 AM EDT ----- Please let Adrienne Strickland know that her urine culture came back positive for infection and I would like her to start Ceftin 500 mg twice daily for 7 days.

## 2023-04-06 NOTE — Telephone Encounter (Signed)
Patient notified and voiced understanding. ABX sent to pharmacy.  

## 2023-04-13 ENCOUNTER — Ambulatory Visit: Payer: Medicare Other | Admitting: Urology

## 2023-04-14 NOTE — Progress Notes (Unsigned)
Cath Change/ Replacement  Patient is present today for a catheter change due to urinary retention.  18 ml of water was removed from the balloon, a 18 FR Silastic foley cath was removed without difficulty.  Patient was cleaned and prepped in a sterile fashion with betadine and 2% lidocaine jelly was instilled into the urethra. A 18 FR Silastic foley cath was replaced into the bladder, no complications were noted. Urine return was noted 100 ml and urine was yellow cloudy in color. The balloon was filled with 20 ml of sterile water. A leg bag was attached for drainage.  A night bag was also given to the patient and patient was given instruction on how to change from one bag to another. Patient was given proper instruction on catheter care.    She also mention that she has been having episodes of right lower quadrant pain.  She states when they occur they last for few minutes.  She states it feels like a sharp little stabbing pain.  She states sometimes it radiates up into the right flank and to the right groin.  She has not noted anything that makes the pain worse or better.  Patient denies any modifying or aggravating factors.  Patient denies any gross hematuria, dysuria or suprapubic.  Patient denies any fevers, chills, nausea or vomiting.    Performed by: Michiel Cowboy, PA-C and Luther Hearing, CMA  Follow up: 3 weeks for Foley catheter exchange and CT report

## 2023-04-17 ENCOUNTER — Encounter: Payer: Self-pay | Admitting: Urology

## 2023-04-17 ENCOUNTER — Ambulatory Visit (INDEPENDENT_AMBULATORY_CARE_PROVIDER_SITE_OTHER): Payer: Medicare Other | Admitting: Urology

## 2023-04-17 VITALS — BP 130/66 | HR 71

## 2023-04-17 DIAGNOSIS — Z978 Presence of other specified devices: Secondary | ICD-10-CM

## 2023-04-17 DIAGNOSIS — R339 Retention of urine, unspecified: Secondary | ICD-10-CM | POA: Diagnosis not present

## 2023-04-17 DIAGNOSIS — R109 Unspecified abdominal pain: Secondary | ICD-10-CM | POA: Diagnosis not present

## 2023-04-27 ENCOUNTER — Ambulatory Visit
Admission: RE | Admit: 2023-04-27 | Discharge: 2023-04-27 | Disposition: A | Discharge status: Home / Self Care | Payer: Medicare Other | Source: Ambulatory Visit | Attending: Urology | Admitting: Urology

## 2023-04-27 DIAGNOSIS — R109 Unspecified abdominal pain: Secondary | ICD-10-CM

## 2023-05-05 NOTE — Progress Notes (Deleted)
05/08/2023 9:23 AM   Adrienne Strickland 1961/11/18 [id number]  Referring provider: Jenell Milliner, MD No address on file  Urological history: 1. Neurogenic bladder -Contributing factors of multiple sclerosis -Failed Botox -Could not tolerate suprapubic tube -cysto (09/2021) -mild erythema consistent with chronic catheter use and mild trabeculation -RUS (2023) - no hydro -Managed with chronic indwelling Foley  No chief complaint on file.   HPI: Adrienne Strickland is a 62 y.o. female who presents today CT report and Foley exchange.  At her last visit on April 17, 2023, her Foley catheter was exchanged and she mentioned that she had been having episodes of right lower quadrant pain.  She described the pain as a stabbing sensation radiating up into the right flank.    CT renal stone study performed on Apr 27, 2023 did not show any evidence of nephrolithiasis or hydronephrosis or any other finding to explain her right-sided pain.   PMH: Past Medical History:  Diagnosis Date   Abnormality of gait 09/26/2013   Arthritis    Duodenal ulcer 02/27/2015   Memory difficulty    Multiple sclerosis (HCC) 09/26/2013   Multiple sclerosis (HCC)    Neurogenic bladder    self caths   Urinary frequency     Surgical History: Past Surgical History:  Procedure Laterality Date   BOTOX INJECTION N/A 12/13/2017   Procedure: BOTOX INJECTION;  Surgeon: Vanna Scotland, MD;  Location: [facility] ORS;  Service: Urology;  Laterality: N/A;   CHOLECYSTECTOMY     COLONOSCOPY WITH PROPOFOL N/A 05/22/2018   Procedure: COLONOSCOPY WITH PROPOFOL;  Surgeon: Christena Deem, MD;  Location: [facility] ENDOSCOPY;  Service: Endoscopy;  Laterality: N/A;   CYSTOSCOPY N/A 12/13/2017   Procedure: CYSTOSCOPY;  Surgeon: Vanna Scotland, MD;  Location: [facility] ORS;  Service: Urology;  Laterality: N/A;   GALLBLADDER SURGERY      Home Medications:  Allergies as of 05/08/2023       Reactions   Sulfa Antibiotics Nausea And Vomiting    Tetanus Toxoids Swelling   Extreme swelling at injection site        Medication List        Accurate as of May 05, 2023  9:23 AM. If you have any questions, ask your nurse or doctor.          baclofen 10 MG tablet Commonly known as: LIORESAL Take 1 tablet by mouth 2 (two) times daily.   cefUROXime 500 MG tablet Commonly known as: CEFTIN Take 1 tablet (500 mg total) by mouth 2 (two) times daily with a meal.   cyanocobalamin 1000 MCG tablet Commonly known as: VITAMIN B12 Take 1,000 mcg by mouth daily.   dalfampridine 10 MG Tb12 Take 1 tablet by mouth twice daily 12 hours apart as directed by physician   Dimethyl Fumarate 120 & 240 MG Misc Take 240 mg once a day   donepezil 10 MG tablet Commonly known as: ARICEPT Take 10 mg by mouth 2 (two) times daily.   fluticasone 50 MCG/ACT nasal spray Commonly known as: FLONASE Place 2 sprays into both nostrils daily.   gabapentin 100 MG capsule Commonly known as: NEURONTIN Take 100 mg by mouth.   memantine 10 MG tablet Commonly known as: NAMENDA Take 1 tablet by mouth 2 (two) times daily.   oxyCODONE 5 MG immediate release tablet Commonly known as: Oxy IR/ROXICODONE Take one tablet by mouth every 6 hours as needed for pain.  Take 2 tablets for first AM dose.   Vitamin D-3  25 MCG (1000 UT) Caps Take 1,000 Units by mouth daily.        Allergies:  Allergies  Allergen Reactions   Sulfa Antibiotics Nausea And Vomiting   Tetanus Toxoids Swelling    Extreme swelling at injection site    Family History: Family History  Problem Relation Age of Onset   Seizures Mother    Emphysema Father    Heart disease Brother    Kidney disease Neg Hx    Prostate cancer Neg Hx    Bladder Cancer Neg Hx     Social History:  reports that she has never smoked. She has never been exposed to tobacco smoke. She has never used smokeless tobacco. She reports current alcohol use. She reports that she does not use  drugs.  ROS: Pertinent ROS in HPI  Physical Exam: LMP 11/23/2018 (Approximate)   Constitutional:  Well nourished. Alert and oriented, No acute distress. HEENT: [place] AT, moist mucus membranes.  Trachea midline, no masses. Cardiovascular: No clubbing, cyanosis, or edema. Respiratory: Normal respiratory effort, no increased work of breathing. GU: No CVA tenderness.  No bladder fullness or masses. Vulvovaginal atrophy w/ pallor, loss of rugae, introital retraction, excoriations.  Vulvar thinning, fusion of labia, clitoral hood retraction, prominent urethral meatus.   *** external genitalia, *** pubic hair distribution, no lesions.  Normal urethral meatus, no lesions, no prolapse, no discharge.   No urethral masses, tenderness and/or tenderness. No bladder fullness, tenderness or masses. *** vagina mucosa, *** estrogen effect, no discharge, no lesions, *** pelvic support, *** cystocele and *** rectocele noted.  No cervical motion tenderness.  Uterus is freely mobile and non-fixed.  No adnexal/parametria masses or tenderness noted.  Anus and perineum are without rashes or lesions.   ***  Neurologic: Grossly intact, no focal deficits, moving all 4 extremities. Psychiatric: Normal mood and affect.    Laboratory Data: N/A  Pertinent Imaging: N/A  Cath Change/ Replacement  Patient is present today for a catheter change due to urinary retention.  ***ml of water was removed from the balloon, a ***FR foley cath was removed without difficulty.  Patient was cleaned and prepped in a sterile fashion with betadine and 2% lidocaine jelly was instilled into the urethra. A *** FR foley cath was replaced into the bladder, {dnt complications:20057}. Urine return was noted ***ml and urine was *** in color. The balloon was filled with 20 ml of sterile water. A *** bag was attached for drainage.  A night bag was also given to the patient and patient was given instruction on how to change from one bag to another. Patient  was given proper instruction on catheter care.    Performed by: ***  Assessment & Plan:    1. Chronic indwelling Foley -Foley successfully exchanged today  2. Right sided pain -CT did not demonstrate an etiology for her right sided pain   No follow-ups on file.  These notes generated with voice recognition software. I apologize for typographical errors.  Cloretta Ned  [facility] [address]  [address] [place], [place] [address] [phone]

## 2023-05-08 ENCOUNTER — Encounter: Payer: Medicare Other | Admitting: Urology

## 2023-05-08 DIAGNOSIS — Z978 Presence of other specified devices: Secondary | ICD-10-CM

## 2023-05-08 DIAGNOSIS — R109 Unspecified abdominal pain: Secondary | ICD-10-CM

## 2023-05-13 NOTE — Progress Notes (Unsigned)
05/15/2023 5:03 PM   Adrienne Strickland [birth year] [id number]  Referring provider: Jenell Milliner, MD [phone] [address] [place],  [place] [address]  Urological history: 1. Neurogenic bladder -Contributing factors of multiple sclerosis -Failed Botox -Could not tolerate suprapubic tube -cysto (09/2021) -mild erythema consistent with chronic catheter use and mild trabeculation -RUS (2023) - no hydro -Managed with chronic indwelling Foley  No chief complaint on file.   HPI: Adrienne Strickland is a 62 y.o. female who presents today CT report and Foley exchange.  At her last visit on April 17, 2023, her Foley catheter was exchanged and she mentioned that she had been having episodes of right lower quadrant pain.  She described the pain as a stabbing sensation radiating up into the right flank.    CT renal stone study performed on Apr 27, 2023 did not show any evidence of nephrolithiasis or hydronephrosis or any other finding to explain her right-sided pain.   PMH: Past Medical History:  Diagnosis Date   Abnormality of gait 09/26/2013   Arthritis    Duodenal ulcer 02/27/2015   Memory difficulty    Multiple sclerosis (HCC) 09/26/2013   Multiple sclerosis (HCC)    Neurogenic bladder    self caths   Urinary frequency     Surgical History: Past Surgical History:  Procedure Laterality Date   BOTOX INJECTION N/A 12/13/2017   Procedure: BOTOX INJECTION;  Surgeon: Vanna Scotland, MD;  Location: [facility] ORS;  Service: Urology;  Laterality: N/A;   CHOLECYSTECTOMY     COLONOSCOPY WITH PROPOFOL N/A 05/22/2018   Procedure: COLONOSCOPY WITH PROPOFOL;  Surgeon: Christena Deem, MD;  Location: [facility] ENDOSCOPY;  Service: Endoscopy;  Laterality: N/A;   CYSTOSCOPY N/A 12/13/2017   Procedure: CYSTOSCOPY;  Surgeon: Vanna Scotland, MD;  Location: [facility] ORS;  Service: Urology;  Laterality: N/A;   GALLBLADDER SURGERY      Home Medications:  Allergies as of 05/15/2023       Reactions   Sulfa  Antibiotics Nausea And Vomiting   Tetanus Toxoids Swelling   Extreme swelling at injection site        Medication List        Accurate as of May 13, 2023  5:03 PM. If you have any questions, ask your nurse or doctor.          baclofen 10 MG tablet Commonly known as: LIORESAL Take 1 tablet by mouth 2 (two) times daily.   cefUROXime 500 MG tablet Commonly known as: CEFTIN Take 1 tablet (500 mg total) by mouth 2 (two) times daily with a meal.   cyanocobalamin 1000 MCG tablet Commonly known as: VITAMIN B12 Take 1,000 mcg by mouth daily.   dalfampridine 10 MG Tb12 Take 1 tablet by mouth twice daily 12 hours apart as directed by physician   Dimethyl Fumarate 120 & 240 MG Misc Take 240 mg once a day   donepezil 10 MG tablet Commonly known as: ARICEPT Take 10 mg by mouth 2 (two) times daily.   fluticasone 50 MCG/ACT nasal spray Commonly known as: FLONASE Place 2 sprays into both nostrils daily.   gabapentin 100 MG capsule Commonly known as: NEURONTIN Take 100 mg by mouth.   memantine 10 MG tablet Commonly known as: NAMENDA Take 1 tablet by mouth 2 (two) times daily.   oxyCODONE 5 MG immediate release tablet Commonly known as: Oxy IR/ROXICODONE Take one tablet by mouth every 6 hours as needed for pain.  Take 2 tablets for first AM dose.  Vitamin D-3 25 MCG (1000 UT) Caps Take 1,000 Units by mouth daily.        Allergies:  Allergies  Allergen Reactions   Sulfa Antibiotics Nausea And Vomiting   Tetanus Toxoids Swelling    Extreme swelling at injection site    Family History: Family History  Problem Relation Age of Onset   Seizures Mother    Emphysema Father    Heart disease Brother    Kidney disease Neg Hx    Prostate cancer Neg Hx    Bladder Cancer Neg Hx     Social History:  reports that she has never smoked. She has never been exposed to tobacco smoke. She has never used smokeless tobacco. She reports current alcohol use. She reports that she  does not use drugs.  ROS: Pertinent ROS in HPI  Physical Exam: LMP 11/23/2018 (Approximate)   Constitutional:  Well nourished. Alert and oriented, No acute distress. HEENT: [place] AT, moist mucus membranes.  Trachea midline, no masses. Cardiovascular: No clubbing, cyanosis, or edema. Respiratory: Normal respiratory effort, no increased work of breathing. GU: No CVA tenderness.  No bladder fullness or masses. Vulvovaginal atrophy w/ pallor, loss of rugae, introital retraction, excoriations.  Vulvar thinning, fusion of labia, clitoral hood retraction, prominent urethral meatus.   *** external genitalia, *** pubic hair distribution, no lesions.  Normal urethral meatus, no lesions, no prolapse, no discharge.   No urethral masses, tenderness and/or tenderness. No bladder fullness, tenderness or masses. *** vagina mucosa, *** estrogen effect, no discharge, no lesions, *** pelvic support, *** cystocele and *** rectocele noted.  No cervical motion tenderness.  Uterus is freely mobile and non-fixed.  No adnexal/parametria masses or tenderness noted.  Anus and perineum are without rashes or lesions.   ***  Neurologic: Grossly intact, no focal deficits, moving all 4 extremities. Psychiatric: Normal mood and affect.    Laboratory Data: N/A  Pertinent Imaging: N/A  Cath Change/ Replacement  Patient is present today for a catheter change due to urinary retention.  ***ml of water was removed from the balloon, a ***FR foley cath was removed without difficulty.  Patient was cleaned and prepped in a sterile fashion with betadine and 2% lidocaine jelly was instilled into the urethra. A *** FR foley cath was replaced into the bladder, {dnt complications:20057}. Urine return was noted ***ml and urine was *** in color. The balloon was filled with 20 ml of sterile water. A *** bag was attached for drainage.  A night bag was also given to the patient and patient was given instruction on how to change from one bag to  another. Patient was given proper instruction on catheter care.    Performed by: ***  Assessment & Plan:    1. Chronic indwelling Foley -Foley successfully exchanged today  2. Right sided pain -CT did not demonstrate an etiology for her right sided pain   No follow-ups on file.  These notes generated with voice recognition software. I apologize for typographical errors.  Cloretta Ned  [facility] [address]  [address] [place], [place] [address] [phone]

## 2023-05-15 ENCOUNTER — Encounter: Payer: Self-pay | Admitting: Urology

## 2023-05-15 ENCOUNTER — Ambulatory Visit (INDEPENDENT_AMBULATORY_CARE_PROVIDER_SITE_OTHER): Payer: Medicare Other | Admitting: Urology

## 2023-05-15 VITALS — BP 132/74 | HR 76 | Ht 65.0 in | Wt 184.0 lb

## 2023-05-15 DIAGNOSIS — R339 Retention of urine, unspecified: Secondary | ICD-10-CM

## 2023-05-15 DIAGNOSIS — R109 Unspecified abdominal pain: Secondary | ICD-10-CM

## 2023-05-15 DIAGNOSIS — Z978 Presence of other specified devices: Secondary | ICD-10-CM

## 2023-06-01 NOTE — Progress Notes (Signed)
Cath Change/ Replacement  Patient is present today for a catheter change due to urinary retention.  15 ml of water was removed from the balloon, a 18 FR Silastic foley cath was removed without difficulty.  Patient was cleaned and prepped in a sterile fashion with betadin.  A 18 FR Silastic foley cath was replaced into the bladder, no complications were noted. Urine return was noted 50 ml and urine was yellow  in color. The balloon was filled with 20 ml of sterile water. A leg bag was attached for drainage.  A night bag was also given to the patient and patient was given instruction on how to change from one bag to another. Patient was given proper instruction on catheter care.    Performed by: Michiel Cowboy, PA-C and Luther Hearing, CMA   Follow up: Return in about 3 weeks (around 06/26/2023) for Foley exchange .

## 2023-06-05 ENCOUNTER — Encounter: Payer: Self-pay | Admitting: Urology

## 2023-06-05 ENCOUNTER — Ambulatory Visit (INDEPENDENT_AMBULATORY_CARE_PROVIDER_SITE_OTHER): Payer: Medicare Other | Admitting: Urology

## 2023-06-05 VITALS — BP 122/71 | HR 71 | Ht 65.0 in | Wt 195.2 lb

## 2023-06-05 DIAGNOSIS — Z978 Presence of other specified devices: Secondary | ICD-10-CM

## 2023-06-05 DIAGNOSIS — R339 Retention of urine, unspecified: Secondary | ICD-10-CM

## 2023-06-25 NOTE — Progress Notes (Unsigned)
Cath Change/ Replacement  Patient is present today for a catheter change due to urinary retention.  ***ml of water was removed from the balloon, a ***FR foley cath was removed without difficulty.  Patient was cleaned and prepped in a sterile fashion with betadine and 2% lidocaine jelly was instilled into the urethra. A *** FR foley cath was replaced into the bladder, {dnt complications:20057}. Urine return was noted ***ml and urine was *** in color. The balloon was filled with 10ml of sterile water. A *** bag was attached for drainage.  A night bag was also given to the patient and patient was given instruction on how to change from one bag to another. Patient was given proper instruction on catheter care.    Performed by: ***  Follow up: No follow-ups on file.  

## 2023-06-26 ENCOUNTER — Ambulatory Visit (INDEPENDENT_AMBULATORY_CARE_PROVIDER_SITE_OTHER): Payer: Medicare Other | Admitting: Urology

## 2023-06-26 VITALS — BP 135/74 | HR 67

## 2023-06-26 DIAGNOSIS — R339 Retention of urine, unspecified: Secondary | ICD-10-CM

## 2023-06-26 DIAGNOSIS — Z978 Presence of other specified devices: Secondary | ICD-10-CM

## 2023-07-16 NOTE — Progress Notes (Unsigned)
Cath Change/ Replacement  Patient is present today for a catheter change due to urinary retention.  ***ml of water was removed from the balloon, a ***FR foley cath was removed without difficulty.  Patient was cleaned and prepped in a sterile fashion with betadine and 2% lidocaine jelly was instilled into the urethra. A *** FR foley cath was replaced into the bladder, {dnt complications:20057}. Urine return was noted ***ml and urine was *** in color. The balloon was filled with 10ml of sterile water. A *** bag was attached for drainage.  A night bag was also given to the patient and patient was given instruction on how to change from one bag to another. Patient was given proper instruction on catheter care.    Performed by: ***  Follow up: No follow-ups on file.  

## 2023-07-17 ENCOUNTER — Ambulatory Visit (INDEPENDENT_AMBULATORY_CARE_PROVIDER_SITE_OTHER): Payer: Medicare Other | Admitting: Urology

## 2023-07-17 ENCOUNTER — Encounter: Payer: Medicare Other | Admitting: Urology

## 2023-07-17 ENCOUNTER — Encounter: Payer: Self-pay | Admitting: Urology

## 2023-07-17 VITALS — BP 121/57 | HR 68

## 2023-07-17 DIAGNOSIS — Z978 Presence of other specified devices: Secondary | ICD-10-CM

## 2023-07-17 DIAGNOSIS — R339 Retention of urine, unspecified: Secondary | ICD-10-CM | POA: Diagnosis not present

## 2023-08-04 NOTE — Progress Notes (Deleted)
Cath Change/ Replacement  Previous records reviewed.    Patient is present today for a catheter change due to urinary retention.  ***ml of water was removed from the balloon, a ***FR foley cath was removed without difficulty.  Patient was cleaned and prepped in a sterile fashion with betadine and 2% lidocaine jelly was instilled into the urethra. A *** FR foley cath was replaced into the bladder, {dnt complications:20057}. Urine return was noted ***ml and urine was *** in color. The balloon was filled with 10ml of sterile water. A *** bag was attached for drainage.  A night bag was also given to the patient and patient was given instruction on how to change from one bag to another. Patient was given proper instruction on catheter care.    Performed by: ***  Follow up: No follow-ups on file.

## 2023-08-07 ENCOUNTER — Ambulatory Visit: Payer: Medicare Other | Admitting: Urology

## 2023-08-07 ENCOUNTER — Telehealth: Payer: Self-pay | Admitting: Urology

## 2023-08-07 DIAGNOSIS — Z978 Presence of other specified devices: Secondary | ICD-10-CM

## 2023-08-07 NOTE — Telephone Encounter (Signed)
LMTCO for an appt.  

## 2023-08-07 NOTE — Telephone Encounter (Signed)
Adrienne Strickland missed Adrienne Strickland appointment today for Foley catheter exchange.  Adrienne Strickland tried to call Adrienne Strickland, but no one answered the phone.  Will you please get Adrienne Strickland rescheduled sometime next week for catheter exchange?

## 2023-08-14 ENCOUNTER — Other Ambulatory Visit: Payer: Self-pay

## 2023-08-15 NOTE — Telephone Encounter (Signed)
Left detailed msg to call and schedule appt for cath exchange.

## 2023-08-18 NOTE — Progress Notes (Unsigned)
Cath Change/ Replacement  Previous records reviewed.    Patient is present today for a catheter change due to urinary retention.  15 ml of water was removed from the balloon, a 18 FR Silastic foley cath was removed without difficulty.  Patient was cleaned and prepped in a sterile fashion with betadine.  A 18  FR Silastic foley cath was replaced into the bladder, no complications were noted. Urine return was noted 30 ml and urine was yellow in color. The balloon was filled with 10ml of sterile water. A leg bag was attached for drainage.  A night bag was also given to the patient and patient was given instruction on how to change from one bag to another. Patient was given proper instruction on catheter care.    Performed by: Michiel Cowboy, PA-C and Luther Hearing, CMA   Follow up: Return in about 3 weeks (around 09/11/2023) for Foley exchange .

## 2023-08-21 ENCOUNTER — Ambulatory Visit (INDEPENDENT_AMBULATORY_CARE_PROVIDER_SITE_OTHER): Payer: Medicare Other | Admitting: Urology

## 2023-08-21 DIAGNOSIS — Z978 Presence of other specified devices: Secondary | ICD-10-CM

## 2023-08-21 DIAGNOSIS — R339 Retention of urine, unspecified: Secondary | ICD-10-CM

## 2023-09-10 NOTE — Progress Notes (Unsigned)
Cath Change/ Replacement  Patient is present today for a catheter change due to urinary retention.  ***ml of water was removed from the balloon, a ***FR foley cath was removed without difficulty.  Patient was cleaned and prepped in a sterile fashion with betadine and 2% lidocaine jelly was instilled into the urethra. A *** FR foley cath was replaced into the bladder, {dnt complications:20057}. Urine return was noted ***ml and urine was *** in color. The balloon was filled with 10ml of sterile water. A *** bag was attached for drainage.  A night bag was also given to the patient and patient was given instruction on how to change from one bag to another. Patient was given proper instruction on catheter care.    Performed by: ***  Follow up: No follow-ups on file.

## 2023-09-11 ENCOUNTER — Ambulatory Visit (INDEPENDENT_AMBULATORY_CARE_PROVIDER_SITE_OTHER): Payer: Medicare Other | Admitting: Urology

## 2023-09-11 DIAGNOSIS — R339 Retention of urine, unspecified: Secondary | ICD-10-CM | POA: Diagnosis not present

## 2023-09-11 DIAGNOSIS — Z978 Presence of other specified devices: Secondary | ICD-10-CM

## 2023-09-29 NOTE — Progress Notes (Unsigned)
Cath Change/ Replacement  Patient is present today for a catheter change due to urinary retention.  18 ml of water was removed from the balloon, a 18 FR Silastic foley cath was removed without difficulty.  Patient was cleaned and prepped in a sterile fashion with betadine and 2% lidocaine jelly was instilled into the urethra. A 18 FR Silastic foley cath was replaced into the bladder, no complications were noted. Urine return was noted 30 ml and urine was yellow in color. The balloon was filled with 10ml of sterile water. A leg bag was attached for drainage.  A night bag was also given to the patient and patient was given instruction on how to change from one bag to another. Patient was given proper instruction on catheter care.    Performed by: Michiel Cowboy, PA-C and Luther Hearing, CMA   Follow up: Return in about 3 weeks (around 10/23/2023) for Foley catheter exchange .

## 2023-10-02 ENCOUNTER — Ambulatory Visit (INDEPENDENT_AMBULATORY_CARE_PROVIDER_SITE_OTHER): Payer: Medicare Other | Admitting: Urology

## 2023-10-02 VITALS — BP 124/69 | HR 64

## 2023-10-02 DIAGNOSIS — Z978 Presence of other specified devices: Secondary | ICD-10-CM

## 2023-10-02 DIAGNOSIS — R339 Retention of urine, unspecified: Secondary | ICD-10-CM | POA: Diagnosis not present

## 2023-10-19 NOTE — Progress Notes (Signed)
Cath Change/ Replacement  Patient is present today for a catheter change due to urinary retention.  18 ml of water was removed from the balloon, a 18 FR Silastic foley cath was removed without difficulty.  Patient was cleaned and prepped in a sterile fashion with betadine.  A 18 FR Silastic foley cath was replaced into the bladder, no complications were noted. Urine return was noted 10 ml and urine was yellow clear in color. The balloon was filled with 20ml of sterile water. A leg bag was attached for drainage.  A night bag was also given to the patient and patient was given instruction on how to change from one bag to another. Patient was given proper instruction on catheter care.    Performed by: Michiel Cowboy, PA-C   Follow up: Return in about 3 weeks (around 11/13/2023) for Foley exchange .

## 2023-10-23 ENCOUNTER — Ambulatory Visit (INDEPENDENT_AMBULATORY_CARE_PROVIDER_SITE_OTHER): Payer: Medicare Other | Admitting: Urology

## 2023-10-23 VITALS — BP 133/73 | HR 78

## 2023-10-23 DIAGNOSIS — R339 Retention of urine, unspecified: Secondary | ICD-10-CM

## 2023-10-23 DIAGNOSIS — Z978 Presence of other specified devices: Secondary | ICD-10-CM

## 2023-11-09 NOTE — Progress Notes (Signed)
Cath Change/ Replacement  Patient is present today for a catheter change due to urinary retention.  18 ml of water was removed from the balloon, a 18 FR Silastic foley cath was removed without difficulty.  Patient was cleaned and prepped in a sterile fashion.  A 18 FR Silastic foley cath was replaced into the bladder, no complications were noted. Urine return was noted 30 ml and urine was yellow in color. The balloon was filled with 20ml of sterile water. A leg bag was attached for drainage.  A night bag was also given to the patient and patient was given instruction on how to change from one bag to another. Patient was given proper instruction on catheter care.    Performed by: Michiel Cowboy, PA-C and Luther Hearing, CMA   Follow up: Return in about 3 weeks (around 12/04/2023) for Foley catheter exchange .

## 2023-11-13 ENCOUNTER — Ambulatory Visit: Payer: Medicare Other | Admitting: Urology

## 2023-11-13 VITALS — BP 133/76 | HR 65

## 2023-11-13 DIAGNOSIS — R339 Retention of urine, unspecified: Secondary | ICD-10-CM | POA: Diagnosis not present

## 2023-11-13 DIAGNOSIS — Z978 Presence of other specified devices: Secondary | ICD-10-CM

## 2023-11-29 NOTE — Progress Notes (Signed)
Cath Change/ Replacement  Patient is present today for a catheter change due to urinary retention.  18 ml of water was removed from the balloon, a 18 FR Silastic foley cath was removed without difficulty.  Patient was cleaned and prepped in a sterile fashion with betadine.  A 18 FR Silastic foley cath was replaced into the bladder, no complications were noted. Urine return was noted 30 ml and urine was yellow clear in color. The balloon was filled with 10ml of sterile water. A leg bag was attached for drainage.  A night bag was also given to the patient and patient was given instruction on how to change from one bag to another. Patient was given proper instruction on catheter care.    Performed by: Michiel Cowboy, PA-C and Luther Hearing, CMA   Follow up: Return in about 3 weeks (around 12/25/2023) for Foley exchange .

## 2023-12-04 ENCOUNTER — Ambulatory Visit (INDEPENDENT_AMBULATORY_CARE_PROVIDER_SITE_OTHER): Payer: Medicare Other | Admitting: Urology

## 2023-12-04 ENCOUNTER — Ambulatory Visit: Payer: Medicare Other | Admitting: Urology

## 2023-12-04 DIAGNOSIS — R339 Retention of urine, unspecified: Secondary | ICD-10-CM | POA: Diagnosis not present

## 2023-12-04 DIAGNOSIS — Z978 Presence of other specified devices: Secondary | ICD-10-CM

## 2023-12-25 NOTE — Progress Notes (Signed)
 Cath Change/ Replacement  Patient is present today for a catheter change due to urinary retention.  17 ml of water was removed from the balloon, a 18 FR Silastic foley cath was removed without difficulty.  Patient was cleaned and prepped in a sterile fashion with betadine and 2% lidocaine  jelly was instilled into the urethra. A 18 Silastic FR foley cath was replaced into the bladder, no complications were noted. Urine return was noted 20 ml and urine was yellow clear in color. The balloon was filled with 10ml of sterile water. A leg bag was attached for drainage.  A night bag was also given to the patient and patient was given instruction on how to change from one bag to another. Patient was given proper instruction on catheter care.    Performed by: CLOTILDA CORNWALL, PA-C and Okie Glanton, CMA   Follow up: Return in about 3 weeks (around 01/22/2024) for Foley exchange .

## 2024-01-01 ENCOUNTER — Ambulatory Visit (INDEPENDENT_AMBULATORY_CARE_PROVIDER_SITE_OTHER): Payer: Medicare Other | Admitting: Urology

## 2024-01-01 DIAGNOSIS — R339 Retention of urine, unspecified: Secondary | ICD-10-CM

## 2024-01-01 DIAGNOSIS — Z978 Presence of other specified devices: Secondary | ICD-10-CM

## 2024-01-17 NOTE — Progress Notes (Signed)
Cath Change/ Replacement  Patient is present today for a catheter change due to urinary retention.  17 ml of water was removed from the balloon, a 18 FR Silastic foley cath was removed without difficulty.  Patient was cleaned and prepped in a sterile fashion with betadine and 2% lidocaine jelly was instilled into the urethra. A 18 FR Silastic foley cath was replaced into the bladder, no complications were noted. Urine return was noted 20 ml and urine was yellow in color. The balloon was filled with 20ml of sterile water. A leg bag was attached for drainage.  A night bag was also given to the patient and patient was given instruction on how to change from one bag to another. Patient was given proper instruction on catheter care.    Performed by: Michiel Cowboy, PA-C and Luther Hearing, CMA (AAMA)   Follow up: Return in about 3 weeks (around 02/12/2024) for Foley exchange .

## 2024-01-22 ENCOUNTER — Ambulatory Visit (INDEPENDENT_AMBULATORY_CARE_PROVIDER_SITE_OTHER): Payer: Medicare Other | Admitting: Urology

## 2024-01-22 DIAGNOSIS — R339 Retention of urine, unspecified: Secondary | ICD-10-CM | POA: Diagnosis not present

## 2024-01-22 DIAGNOSIS — Z978 Presence of other specified devices: Secondary | ICD-10-CM

## 2024-02-16 NOTE — Progress Notes (Signed)
 Cath Change/ Replacement  Patient is present today for a catheter change due to urinary retention.  18ml of water was removed from the balloon, a 18 FR Silastic foley cath was removed without difficulty.  Patient was cleaned and prepped in a sterile fashion with betadine.   A 18 FR Silastic foley cath was replaced into the bladder, no complications were noted. Urine return was noted 40 ml and urine was yellow clear in color. The balloon was filled with 20 ml of sterile water. A leg bag was attached for drainage.  A night bag was also given to the patient and patient was given instruction on how to change from one bag to another. Patient was given proper instruction on catheter care.    Performed by: Michiel Cowboy, PA-C and Titus Mould Hopkins, CMA    Follow up: Return in about 1 month (around 03/18/2024) for Foley exchange .

## 2024-02-19 ENCOUNTER — Ambulatory Visit (INDEPENDENT_AMBULATORY_CARE_PROVIDER_SITE_OTHER): Payer: Medicare Other | Admitting: Urology

## 2024-02-19 VITALS — BP 154/78 | HR 76 | Ht 65.0 in | Wt 198.0 lb

## 2024-02-19 DIAGNOSIS — R339 Retention of urine, unspecified: Secondary | ICD-10-CM | POA: Diagnosis not present

## 2024-02-19 DIAGNOSIS — Z978 Presence of other specified devices: Secondary | ICD-10-CM

## 2024-03-04 ENCOUNTER — Ambulatory Visit (INDEPENDENT_AMBULATORY_CARE_PROVIDER_SITE_OTHER): Admitting: Urology

## 2024-03-04 DIAGNOSIS — Z978 Presence of other specified devices: Secondary | ICD-10-CM

## 2024-03-04 DIAGNOSIS — R339 Retention of urine, unspecified: Secondary | ICD-10-CM | POA: Diagnosis not present

## 2024-03-04 NOTE — Progress Notes (Signed)
 Simple Catheter Placement  Patient called the office this morning stating her catheter fell out yesterday evening.  She does empty her bladder and had been incontinent all night.  Due to urinary retention patient is present today for a foley cath placement.  Patient was cleaned and prepped in a sterile fashion with betadine.   A 16 FR foley catheter was inserted, urine return was noted  100 ml, urine was yellow in color.  The balloon was filled with 30cc of sterile water.  A leg bag was attached for drainage. Patient was also given a night bag to take home and was given instruction on how to change from one bag to another.  Patient was given instruction on proper catheter care.  Patient tolerated well, no complications were noted   Performed by: Michiel Cowboy, PA-C and Luther Hearing, CMA   Additional notes/ Follow up: Return in about 1 month (around 04/04/2024) for Foley exchange .

## 2024-03-11 ENCOUNTER — Ambulatory Visit: Payer: Medicare Other | Admitting: Urology

## 2024-03-11 ENCOUNTER — Ambulatory Visit: Admitting: Urology

## 2024-03-24 NOTE — Progress Notes (Unsigned)
 Cath Change/ Replacement  Patient is present today for a catheter change due to urinary retention.  18 ml of water was removed from the balloon, a 16 FR foley cath was removed without difficulty.  Patient was cleaned and prepped in a sterile fashion with betadine.  A 16 FR foley cath was replaced into the bladder, {dnt complications:20057}. Urine return was noted ***ml and urine was *** in color. The balloon was filled with 10ml of sterile water. A *** bag was attached for drainage.  A night bag was also given to the patient and patient was given instruction on how to change from one bag to another. Patient was given proper instruction on catheter care.    Performed by: Michiel Cowboy, PA-C and ***  Follow up: No follow-ups on file.

## 2024-03-25 ENCOUNTER — Ambulatory Visit: Admitting: Urology

## 2024-03-25 VITALS — BP 137/79 | HR 60 | Ht 65.0 in | Wt 200.0 lb

## 2024-03-25 DIAGNOSIS — Z978 Presence of other specified devices: Secondary | ICD-10-CM | POA: Diagnosis not present

## 2024-04-01 ENCOUNTER — Ambulatory Visit (INDEPENDENT_AMBULATORY_CARE_PROVIDER_SITE_OTHER): Admitting: Urology

## 2024-04-01 ENCOUNTER — Encounter: Payer: Self-pay | Admitting: Urology

## 2024-04-01 DIAGNOSIS — T839XXA Unspecified complication of genitourinary prosthetic device, implant and graft, initial encounter: Secondary | ICD-10-CM

## 2024-04-01 NOTE — Progress Notes (Signed)
 04/01/2024 11:45 AM   Adrienne Strickland 10/06/1961 [id number]  Referring provider: Jenell Milliner, MD [phone] [address] [place],  [place] [address]  Urological history: 1.  Neurogenic bladder -Contributing factors of multiple sclerosis -Failed Botox -Could not tolerate suprapubic tube -cysto (09/2021) -mild erythema consistent with chronic catheter use and mild trabeculation -RUS (2023) - no hydro -CT (95621 - NED  -Managed with chronic indwelling Foley  Chief Complaint  Patient presents with   Catheter Exchange   HPI: Adrienne Strickland is a 63 y.o. fenale who presents today for catheter falling out with her SO, Derek.   Previous records reviewed.   She was having a very large bowel movement yesterday when her Foley catheter fell out.  This happened previously under the same circumstances.  She states the Foley balloon was still intact.  She denied any pain or blood when the catheter fell out.  Patient denies any modifying or aggravating factors.  Patient denies any recent UTI's, gross hematuria, dysuria or suprapubic/flank pain.  Patient denies any fevers, chills, nausea or vomiting.    PMH: Past Medical History:  Diagnosis Date   Abnormality of gait 09/26/2013   Arthritis    Duodenal ulcer 02/27/2015   Memory difficulty    Multiple sclerosis (HCC) 09/26/2013   Multiple sclerosis (HCC)    Neurogenic bladder    self caths   Urinary frequency     Surgical History: Past Surgical History:  Procedure Laterality Date   BOTOX INJECTION N/A 12/13/2017   Procedure: BOTOX INJECTION;  Surgeon: Vanna Scotland, MD;  Location: [facility] ORS;  Service: Urology;  Laterality: N/A;   CHOLECYSTECTOMY     COLONOSCOPY WITH PROPOFOL N/A 05/22/2018   Procedure: COLONOSCOPY WITH PROPOFOL;  Surgeon: Christena Deem, MD;  Location: [facility] ENDOSCOPY;  Service: Endoscopy;  Laterality: N/A;   CYSTOSCOPY N/A 12/13/2017   Procedure: CYSTOSCOPY;  Surgeon: Vanna Scotland, MD;  Location: [facility] ORS;   Service: Urology;  Laterality: N/A;   GALLBLADDER SURGERY      Home Medications:  Allergies as of 04/01/2024       Reactions   Sulfa Antibiotics Nausea And Vomiting   Tetanus Toxoids Swelling   Extreme swelling at injection site        Medication List        Accurate as of April 01, 2024 11:45 AM. If you have any questions, ask your nurse or doctor.          baclofen 10 MG tablet Commonly known as: LIORESAL Take 10 mg by mouth 3 (three) times daily.   cyanocobalamin 1000 MCG tablet Commonly known as: VITAMIN B12 Take 1,000 mcg by mouth daily.   dalfampridine 10 MG Tb12 Take 1 tablet by mouth twice daily 12 hours apart as directed by physician   Dimethyl Fumarate 240 MG Cpdr Take 1 capsule by mouth daily.   donepezil 10 MG tablet Commonly known as: ARICEPT Take 10 mg by mouth 2 (two) times daily.   fluticasone 50 MCG/ACT nasal spray Commonly known as: FLONASE Place 2 sprays into both nostrils daily.   furosemide 20 MG tablet Commonly known as: LASIX Take 1 tablet by mouth daily.   gabapentin 100 MG capsule Commonly known as: NEURONTIN Take 200 mg by mouth 2 (two) times daily.   memantine 10 MG tablet Commonly known as: NAMENDA Take 1 tablet by mouth 2 (two) times daily.   oxyCODONE 5 MG immediate release tablet Commonly known as: Oxy IR/ROXICODONE Take one tablet by mouth every  6 hours as needed for pain.  Take 2 tablets for first AM dose.   Vitamin D-3 25 MCG (1000 UT) Caps Take 1,000 Units by mouth daily.        Allergies:  Allergies  Allergen Reactions   Sulfa Antibiotics Nausea And Vomiting   Tetanus Toxoids Swelling    Extreme swelling at injection site    Family History: Family History  Problem Relation Age of Onset   Seizures Mother    Emphysema Father    Heart disease Brother    Kidney disease Neg Hx    Prostate cancer Neg Hx    Bladder Cancer Neg Hx     Social History:  reports that she has never smoked. She has never been  exposed to tobacco smoke. She has never used smokeless tobacco. She reports current alcohol use. She reports that she does not use drugs.  ROS: Pertinent ROS in HPI  Physical Exam: LMP 11/23/2018 (Approximate)   Constitutional:  Well nourished. Alert and oriented, No acute distress. HEENT: [place] AT, moist mucus membranes.  Trachea midline Cardiovascular: No clubbing, cyanosis, or edema. Respiratory: Normal respiratory effort, no increased work of breathing. GU: No CVA tenderness.  No bladder fullness or masses.  Recession of labia minora, dry, pale vulvar vaginal mucosa and loss of mucosal ridges and folds.  Normal urethral meatus, no lesions, no prolapse, no discharge.   No urethral masses, tenderness and/or tenderness. No bladder fullness, tenderness or masses. Pale vagina mucosa, fair estrogen effect, no discharge, no lesions, fair pelvic support, grade I cystocele and no rectocele noted.  Anus and perineum are without rashes or lesions.    Neurologic: Grossly intact, no focal deficits, moving all 4 extremities. Psychiatric: Normal mood and affect.    Laboratory Data: CBC w/ Differential Order: [id number] Component Ref Range & Units 1 mo ago  WBC 3.6 - 11.2 10*9/L 8.9  RBC 3.95 - 5.13 10*12/L 4.86  HGB 11.3 - 14.9 g/dL 19.1  HCT 47.8 - 29.5 % 41.8  MCV 77.6 - 95.7 fL 86  MCH 25.9 - 32.4 pg 28.7  MCHC 32.0 - 36.0 g/dL 62.1  RDW 30.8 - 65.7 % 13.8  MPV 6.8 - 10.7 fL 8.4  Platelet 150 - 450 10*9/L 288  nRBC <=4 /100 WBCs 0  Neutrophils % % 76.2  Lymphocytes % % 15.7  Monocytes % % 5.4  Eosinophils % % 1.6  Basophils % % 1.1  Absolute Neutrophils 1.8 - 7.8 10*9/L 6.8  Absolute Lymphocytes 1.1 - 3.6 10*9/L 1.4  Absolute Monocytes 0.3 - 0.8 10*9/L 0.5  Absolute Eosinophils 0.0 - 0.5 10*9/L 0.1  Absolute Basophils 0.0 - 0.1 10*9/L 0.1  Resulting Agency [facility]   Specimen Collected: 02/12/24 18:15   Performed by: [facility] Last Resulted: 02/12/24 18:22  Received From: [facility]  Result Received: 02/16/24 08:08   Comprehensive Metabolic Panel Order: [id number] Component Ref Range & Units 1 mo ago  Sodium 135 - 145 mmol/L 143  Potassium 3.4 - 4.8 mmol/L 3.9  Chloride 98 - 107 mmol/L 103  CO2 20.0 - 31.0 mmol/L 27.4  Anion Gap 5 - 14 mmol/L 13  BUN 9 - 23 mg/dL 9  Creatinine 8.41 - 3.24 mg/dL 4.01  BUN/Creatinine Ratio 10  eGFR CKD-EPI (2021) Female >=60 mL/min/1.11m2 74  Comment: eGFR calculated with CKD-EPI 2021 equation in accordance with [employer] and [employer] of Nephrology Task Force recommendations.  Glucose 70 - 179 mg/dL 027  Calcium  8.7 - 10.4 mg/dL 9.8  Albumin 3.4 - 5.0 g/dL 4  Total Protein 5.7 - 8.2 g/dL 7.3  Total Bilirubin 0.3 - 1.2 mg/dL 0.2 Low   AST <=62 U/L 29  ALT 10 - 49 U/L 24  Alkaline Phosphatase 46 - 116 U/L 150 High   Resulting Agency [facility]   Specimen Collected: 02/12/24 18:15   Performed by: [facility] Last Resulted: 02/12/24 18:46  Received From: [facility]  Result Received: 02/16/24 08:08   Hemoglobin A1c Order: [id number] Component Ref Range & Units 3 mo ago  Hemoglobin A1C 4.8 - 5.6 % 5.3  Estimated Average Glucose mg/dL 696  Resulting Agency [facility]  Narrative Performed by [facility] Screening or Diagnosis of Diabetes Mellitus* A1c Reference Interval        Interpretation 4.8 - 5.6                     Normal 5.7 - 6.4                     Dysglycemia >6.4                          Diabetes Mellitus  *Not recommended for diagnosis of diabetes in children with Cystic Fibrosis or with symptoms suggestive of acute onset type 1 diabetes.   A1c Glycemic Goal: <7.0 %  **Goals should be individualized; more or less stringent A1c glycemic goals may be appropriate for individual patients. (Adopted from: 2020 ADA  Standards of Medical Care In Diabetes)  Specimen Collected: 12/05/23 11:30   Performed by: [facility] Last Resulted: 12/05/23 20:08  Received From: [facility]  Result Received: 12/25/23 07:08   Urinalysis See EPIC and HPI  I have reviewed the labs.   Pertinent Imaging: N/A   Simple Catheter Placement  Due to urinary retention patient is present today for a foley cath placement.  Patient was cleaned and prepped in a sterile fashion with betadine.  A 16 FR Silastic foley catheter was inserted, urine return was noted  40 ml, urine was yellow in color.  The balloon was filled with 30 cc of sterile water.  A leg bag was attached for drainage. Patient was also given a night bag to take home and was given instruction on how to change from one bag to another.  Patient was given instruction on proper catheter care.  Patient tolerated well, no complications were noted   Performed by: Michiel Cowboy, PA-C    1. Foley catheter issues - I cannot appreciate any urethral erosion on exam or any other suspicious finding that would make her more susceptible for a Foley catheter to fall out - This may be occurring due to high pressure volumes in the bladder due to her MS - I have put 30 cc in the balloon today and helps to prevent any further catheter dislodgment - She will return as scheduled for Foley catheter exchange   Return in about 3 weeks (around 04/22/2024) for Foley catheter exchange .  These notes generated with voice recognition software. I apologize for typographical errors.  Cloretta Ned  [facility] [address]  [address] [place], [place] [address] [phone]

## 2024-04-08 ENCOUNTER — Ambulatory Visit
Admission: RE | Admit: 2024-04-08 | Discharge: 2024-04-08 | Disposition: A | Discharge status: Home / Self Care | Source: Ambulatory Visit | Attending: Physician Assistant

## 2024-04-08 ENCOUNTER — Ambulatory Visit
Admission: RE | Admit: 2024-04-08 | Discharge: 2024-04-08 | Disposition: A | Discharge status: Home / Self Care | Attending: Physician Assistant | Admitting: Physician Assistant

## 2024-04-08 ENCOUNTER — Encounter: Attending: Physician Assistant | Admitting: Physician Assistant

## 2024-04-08 ENCOUNTER — Other Ambulatory Visit: Payer: Self-pay | Admitting: Physician Assistant

## 2024-04-08 DIAGNOSIS — B999 Unspecified infectious disease: Secondary | ICD-10-CM

## 2024-04-08 DIAGNOSIS — G35 Multiple sclerosis: Secondary | ICD-10-CM | POA: Diagnosis not present

## 2024-04-08 DIAGNOSIS — I87331 Chronic venous hypertension (idiopathic) with ulcer and inflammation of right lower extremity: Secondary | ICD-10-CM | POA: Insufficient documentation

## 2024-04-08 DIAGNOSIS — K267 Chronic duodenal ulcer without hemorrhage or perforation: Secondary | ICD-10-CM | POA: Insufficient documentation

## 2024-04-08 DIAGNOSIS — L03115 Cellulitis of right lower limb: Secondary | ICD-10-CM | POA: Diagnosis present

## 2024-04-08 DIAGNOSIS — L97412 Non-pressure chronic ulcer of right heel and midfoot with fat layer exposed: Secondary | ICD-10-CM | POA: Diagnosis not present

## 2024-04-15 ENCOUNTER — Encounter: Admitting: Physician Assistant

## 2024-04-15 ENCOUNTER — Ambulatory Visit: Admitting: Urology

## 2024-04-15 DIAGNOSIS — L03115 Cellulitis of right lower limb: Secondary | ICD-10-CM | POA: Diagnosis not present

## 2024-04-23 ENCOUNTER — Encounter: Admitting: Physician Assistant

## 2024-04-23 DIAGNOSIS — L03115 Cellulitis of right lower limb: Secondary | ICD-10-CM | POA: Diagnosis not present

## 2024-04-29 ENCOUNTER — Encounter: Attending: Physician Assistant | Admitting: Physician Assistant

## 2024-04-29 ENCOUNTER — Ambulatory Visit (INDEPENDENT_AMBULATORY_CARE_PROVIDER_SITE_OTHER): Admitting: Urology

## 2024-04-29 VITALS — BP 152/79 | HR 73

## 2024-04-29 DIAGNOSIS — L03115 Cellulitis of right lower limb: Secondary | ICD-10-CM | POA: Insufficient documentation

## 2024-04-29 DIAGNOSIS — N390 Urinary tract infection, site not specified: Secondary | ICD-10-CM

## 2024-04-29 DIAGNOSIS — G35 Multiple sclerosis: Secondary | ICD-10-CM | POA: Diagnosis not present

## 2024-04-29 DIAGNOSIS — L97412 Non-pressure chronic ulcer of right heel and midfoot with fat layer exposed: Secondary | ICD-10-CM | POA: Diagnosis not present

## 2024-04-29 DIAGNOSIS — I87331 Chronic venous hypertension (idiopathic) with ulcer and inflammation of right lower extremity: Secondary | ICD-10-CM | POA: Diagnosis not present

## 2024-04-29 DIAGNOSIS — K267 Chronic duodenal ulcer without hemorrhage or perforation: Secondary | ICD-10-CM | POA: Diagnosis not present

## 2024-04-29 DIAGNOSIS — R339 Retention of urine, unspecified: Secondary | ICD-10-CM | POA: Diagnosis not present

## 2024-04-29 DIAGNOSIS — N319 Neuromuscular dysfunction of bladder, unspecified: Secondary | ICD-10-CM

## 2024-04-29 NOTE — Progress Notes (Signed)
 Cath Change/ Replacement  Patient is present today for a catheter change due to urinary retention.  28 ml of water was removed from the balloon, a 16 FR Silastic foley cath was removed without difficulty.  Patient was cleaned and prepped in a sterile fashion with betadine.  A 16 FR Silastic foley cath was replaced into the bladder, no complications were noted. Urine return was noted 50 ml and urine was yellow  in color. The balloon was filled with 30 ml of sterile water. A leg bag was attached for drainage.  A night bag was also given to the patient and patient was given instruction on how to change from one bag to another. Patient was given proper instruction on catheter care.    Performed by: Matilde Son, PA-C and Anastasiya V. Hopkins, CMA    Follow up: Return in about 1 month (around 05/30/2024) for Renal ultrasound report and Foley catheter exchange.

## 2024-05-06 ENCOUNTER — Encounter: Admitting: Physician Assistant

## 2024-05-06 DIAGNOSIS — L03115 Cellulitis of right lower limb: Secondary | ICD-10-CM | POA: Diagnosis not present

## 2024-05-09 ENCOUNTER — Ambulatory Visit

## 2024-05-13 ENCOUNTER — Encounter: Admitting: Physician Assistant

## 2024-05-13 DIAGNOSIS — L03115 Cellulitis of right lower limb: Secondary | ICD-10-CM | POA: Diagnosis not present

## 2024-05-14 ENCOUNTER — Ambulatory Visit
Admission: RE | Admit: 2024-05-14 | Discharge: 2024-05-14 | Disposition: A | Discharge status: Home / Self Care | Source: Ambulatory Visit | Attending: Urology | Admitting: Urology

## 2024-05-14 DIAGNOSIS — N390 Urinary tract infection, site not specified: Secondary | ICD-10-CM

## 2024-05-14 DIAGNOSIS — N319 Neuromuscular dysfunction of bladder, unspecified: Secondary | ICD-10-CM

## 2024-05-21 ENCOUNTER — Encounter: Admitting: Physician Assistant

## 2024-05-21 DIAGNOSIS — L03115 Cellulitis of right lower limb: Secondary | ICD-10-CM | POA: Diagnosis not present

## 2024-05-27 ENCOUNTER — Ambulatory Visit: Admitting: Physician Assistant

## 2024-05-27 ENCOUNTER — Ambulatory Visit (INDEPENDENT_AMBULATORY_CARE_PROVIDER_SITE_OTHER): Admitting: Urology

## 2024-05-27 DIAGNOSIS — R339 Retention of urine, unspecified: Secondary | ICD-10-CM | POA: Diagnosis not present

## 2024-05-27 DIAGNOSIS — N390 Urinary tract infection, site not specified: Secondary | ICD-10-CM

## 2024-05-27 DIAGNOSIS — N319 Neuromuscular dysfunction of bladder, unspecified: Secondary | ICD-10-CM

## 2024-05-27 NOTE — Progress Notes (Signed)
 Cath Change/ Replacement  Patient is present today for a catheter change due to urinary retention.  25 ml of water was removed from the balloon, a 16 FR Silastic foley cath was removed without difficulty.  Patient was cleaned and prepped in a sterile fashion with betadine and 2% lidocaine  jelly was instilled into the urethra. A 16 FR Silastic foley cath was replaced into the bladder, no complications were noted.   We did not experience any urine return, but I did do pelvic exam and palpated a Foley in the correct position.  The balloon was filled with 30ml of sterile water. A leg bag was attached for drainage.  A night bag was also given to the patient and patient was given instruction on how to change from one bag to another. Patient was given proper instruction on catheter care.    Performed by: Savonna Birchmeier, PA-C and Okie Glanton, CMA (AAMA)  Follow up: Return in about 4 weeks (around 06/24/2024).  They did not get the renal ultrasound yet, they state it was scheduled and they showed for the appointment, but then the weight became too long and they had to leave.  I went ahead and reordered the ultrasound.

## 2024-05-28 ENCOUNTER — Encounter: Attending: Physician Assistant | Admitting: Physician Assistant

## 2024-05-28 DIAGNOSIS — G35 Multiple sclerosis: Secondary | ICD-10-CM | POA: Insufficient documentation

## 2024-05-28 DIAGNOSIS — K267 Chronic duodenal ulcer without hemorrhage or perforation: Secondary | ICD-10-CM | POA: Diagnosis not present

## 2024-05-28 DIAGNOSIS — L97412 Non-pressure chronic ulcer of right heel and midfoot with fat layer exposed: Secondary | ICD-10-CM | POA: Insufficient documentation

## 2024-05-28 DIAGNOSIS — L03115 Cellulitis of right lower limb: Secondary | ICD-10-CM | POA: Insufficient documentation

## 2024-05-28 DIAGNOSIS — I87331 Chronic venous hypertension (idiopathic) with ulcer and inflammation of right lower extremity: Secondary | ICD-10-CM | POA: Diagnosis not present

## 2024-06-11 ENCOUNTER — Ambulatory Visit (INDEPENDENT_AMBULATORY_CARE_PROVIDER_SITE_OTHER): Admitting: Physician Assistant

## 2024-06-11 VITALS — BP 130/80 | HR 74 | Ht 65.0 in | Wt 200.0 lb

## 2024-06-11 DIAGNOSIS — R339 Retention of urine, unspecified: Secondary | ICD-10-CM

## 2024-06-11 DIAGNOSIS — T839XXA Unspecified complication of genitourinary prosthetic device, implant and graft, initial encounter: Secondary | ICD-10-CM

## 2024-06-11 NOTE — Progress Notes (Signed)
 Patient presented for same-day visit for Foley replacement. It fell out yesterday in the setting of a large BM.  Simple Catheter Placement  Due to urinary retention patient is present today for a foley cath placement.  Patient was cleaned and prepped in a sterile fashion with betadine. A 16 FR Silastic foley catheter was inserted, urine return was noted 30ml, urine was clear in color.  The balloon was filled with 30cc of sterile water.  A leg bag was attached for drainage. Patient was also given a night bag to take home.  Patient tolerated well, no complications were noted   Performed by: Kalysta Kneisley, PA-C and Jessica Qualls, CMA  Follow up: Return in about 4 weeks (around 07/09/2024) for Catheter exchange.

## 2024-06-13 ENCOUNTER — Encounter: Admitting: Internal Medicine

## 2024-06-13 DIAGNOSIS — L03115 Cellulitis of right lower limb: Secondary | ICD-10-CM | POA: Diagnosis not present

## 2024-06-21 ENCOUNTER — Ambulatory Visit (INDEPENDENT_AMBULATORY_CARE_PROVIDER_SITE_OTHER): Admitting: Urology

## 2024-06-21 DIAGNOSIS — R339 Retention of urine, unspecified: Secondary | ICD-10-CM

## 2024-06-21 DIAGNOSIS — T839XXA Unspecified complication of genitourinary prosthetic device, implant and graft, initial encounter: Secondary | ICD-10-CM

## 2024-06-21 MED ORDER — POLYETHYLENE GLYCOL 3350 17 GM/SCOOP PO POWD: 119.0000 g | Freq: Every day | ORAL | 3 refills | Status: AC

## 2024-06-21 NOTE — Progress Notes (Unsigned)
 Simple Catheter Placement  Due to urinary retention patient is present today for a foley cath placement.  Patient was cleaned and prepped in a sterile fashion with betadine and 2% lidocaine  jelly was instilled into the urethra. A 16 FR Silastic foley catheter was inserted, urine return was noted  50 ml, urine was yellow in color.  The balloon was filled with 10cc of sterile water.  A leg bag was attached for drainage. Patient was also given a night bag to take home and was given instruction on how to change from one bag to another.  Patient was given instruction on proper catheter care.  Patient tolerated well, no complications were noted   Performed by: CLOTILDA CORNWALL, PA-C and [place] Glanton, CMA (AAMA)    Additional notes/ Follow up: Return for return as scheduled for catheter exchange .    Discussed switching to SPT, but she would expel them as well.

## 2024-06-24 ENCOUNTER — Ambulatory Visit: Admitting: Urology

## 2024-07-04 ENCOUNTER — Ambulatory Visit: Admitting: Physician Assistant

## 2024-07-05 NOTE — Progress Notes (Unsigned)
 Cath Change/ Replacement  Patient is present today for a catheter change due to urinary retention.  20 ml of water was removed from the balloon, a 16 FR Silastic foley cath was removed without difficulty.  Patient was cleaned and prepped in a sterile fashion with betadine and 2% lidocaine  jelly was instilled into the urethra. A 16 fr Silastic FR foley cath was replaced into the bladder, no complications were noted. Urine return was noted 20 ml and urine was yellow clear in color. The balloon was filled with 30ml of sterile water. A leg bag was attached for drainage.  A night bag was also given to the patient and patient was given instruction on how to change from one bag to another. Patient was given proper instruction on catheter care.    Performed by: CLOTILDA CORNWALL, PA-C and [place] Glanton, CMA (AAMA)  and Beauford Browner, CMA   Follow up: Return in about 1 month (around 08/08/2024) for Foley catheter exchange .

## 2024-07-08 ENCOUNTER — Ambulatory Visit (INDEPENDENT_AMBULATORY_CARE_PROVIDER_SITE_OTHER): Admitting: Urology

## 2024-07-08 VITALS — BP 113/68 | HR 68 | Ht 65.0 in | Wt 196.0 lb

## 2024-07-08 DIAGNOSIS — N319 Neuromuscular dysfunction of bladder, unspecified: Secondary | ICD-10-CM

## 2024-07-08 DIAGNOSIS — R339 Retention of urine, unspecified: Secondary | ICD-10-CM

## 2024-07-09 ENCOUNTER — Encounter: Attending: Physician Assistant | Admitting: Physician Assistant

## 2024-07-09 DIAGNOSIS — G35 Multiple sclerosis: Secondary | ICD-10-CM | POA: Insufficient documentation

## 2024-07-09 DIAGNOSIS — L97412 Non-pressure chronic ulcer of right heel and midfoot with fat layer exposed: Secondary | ICD-10-CM | POA: Insufficient documentation

## 2024-07-09 DIAGNOSIS — L03115 Cellulitis of right lower limb: Secondary | ICD-10-CM | POA: Diagnosis present

## 2024-07-09 DIAGNOSIS — I87331 Chronic venous hypertension (idiopathic) with ulcer and inflammation of right lower extremity: Secondary | ICD-10-CM | POA: Diagnosis not present

## 2024-07-09 DIAGNOSIS — K267 Chronic duodenal ulcer without hemorrhage or perforation: Secondary | ICD-10-CM | POA: Diagnosis not present

## 2024-07-18 NOTE — Progress Notes (Deleted)
 Cath Change/ Replacement  Patient is present today for a catheter change due to urinary retention.  29 ml of water was removed from the balloon, a 16 fr FR Silastic foley cath was removed without difficulty.  Patient was cleaned and prepped in a sterile fashion with betadine and 2% lidocaine  jelly was instilled into the urethra. A 16 fr  FR Silastic foley cath was replaced into the bladder, no complications were noted. Urine return was noted ***ml and urine was *** in color. The balloon was filled with 30ml of sterile water. A *** bag was attached for drainage.  A night bag was also given to the patient and patient was given instruction on how to change from one bag to another. Patient was given proper instruction on catheter care.    Performed by: ***  Follow up: No follow-ups on file.

## 2024-07-29 ENCOUNTER — Ambulatory Visit: Admitting: Urology

## 2024-07-29 DIAGNOSIS — N319 Neuromuscular dysfunction of bladder, unspecified: Secondary | ICD-10-CM

## 2024-08-09 ENCOUNTER — Telehealth: Payer: Self-pay

## 2024-08-09 NOTE — Telephone Encounter (Signed)
 Cleotilde Trish CROME, RN  Cleotilde Salines, CMA Can you check on why her insurance is denying our services.  Thank you,  Received a staff message from Lyn to contact pt regarding an insurance denial.   Pt was not available. S/W Adrienne Strickland - BF per DPR.  20.00 copay for 5 visits. Derek aware that we did have a SA change in 2/25 which did cause some denials. However, that has been corrected.   He asked about HAR which I am unable to  view.   He is aware that on her visit for 8/25 she will owe 120.00.

## 2024-08-12 ENCOUNTER — Ambulatory Visit: Admitting: Urology

## 2024-08-18 NOTE — Progress Notes (Unsigned)
   Cath Change/ Replacement  Patient is present today for a catheter change due to urinary retention.  29 ml of water was removed from the balloon, a 16 fr FR Silastic foley cath was removed without difficulty.  Patient was cleaned and prepped in a sterile fashion with betadine and 2% lidocaine  jelly was instilled into the urethra. A 16 fr  FR Silastic foley cath was replaced into the bladder, no complications were noted. Urine return was noted 60 ml and urine was yellow clear in color. The balloon was filled with 30ml of sterile water. A leg bag was attached for drainage.  A night bag was also given to the patient and patient was given instruction on how to change from one bag to another. Patient was given proper instruction on catheter care.    Performed by: CLOTILDA CORNWALL, PA-C and Humberta Magallon-Mariche, CMA    Follow up: Return in about 6 weeks (around 09/30/2024) for Foley exchange .   Still needs to have RUS, they state they cannot afford this at this time, but will try to get it in the near future.  I explained that I am concerned that she may have a high pressure bladder that could be possibly causing hydronephrosis which would result in renal damage.  They stated they will get the renal ultrasound as soon as they can fit it into their budget.

## 2024-08-19 ENCOUNTER — Ambulatory Visit (INDEPENDENT_AMBULATORY_CARE_PROVIDER_SITE_OTHER): Admitting: Urology

## 2024-08-19 ENCOUNTER — Encounter: Payer: Self-pay | Admitting: Urology

## 2024-08-19 VITALS — BP 122/72 | HR 66 | Ht 65.0 in | Wt 195.0 lb

## 2024-08-19 DIAGNOSIS — N319 Neuromuscular dysfunction of bladder, unspecified: Secondary | ICD-10-CM

## 2024-08-19 DIAGNOSIS — R339 Retention of urine, unspecified: Secondary | ICD-10-CM

## 2024-08-19 NOTE — Telephone Encounter (Signed)
 When pt arrived for visit today, they asked if they could pay $60 today.  I informed patient that would be O.K. but they will owe $100 at next visit, $80 past due (including today) and $20 co-pay for next visit.

## 2024-09-10 ENCOUNTER — Other Ambulatory Visit: Payer: Self-pay

## 2024-09-10 ENCOUNTER — Observation Stay
Admission: EM | Admit: 2024-09-10 | Discharge: 2024-09-12 | Disposition: A | Discharge status: Home / Self Care | Attending: Internal Medicine | Admitting: Internal Medicine

## 2024-09-10 ENCOUNTER — Emergency Department

## 2024-09-10 DIAGNOSIS — S8992XA Unspecified injury of left lower leg, initial encounter: Secondary | ICD-10-CM | POA: Diagnosis present

## 2024-09-10 DIAGNOSIS — Z79899 Other long term (current) drug therapy: Secondary | ICD-10-CM | POA: Diagnosis not present

## 2024-09-10 DIAGNOSIS — Z9189 Other specified personal risk factors, not elsewhere classified: Secondary | ICD-10-CM

## 2024-09-10 DIAGNOSIS — N319 Neuromuscular dysfunction of bladder, unspecified: Secondary | ICD-10-CM | POA: Diagnosis not present

## 2024-09-10 DIAGNOSIS — M7989 Other specified soft tissue disorders: Secondary | ICD-10-CM | POA: Diagnosis not present

## 2024-09-10 DIAGNOSIS — Z7409 Other reduced mobility: Secondary | ICD-10-CM | POA: Diagnosis not present

## 2024-09-10 DIAGNOSIS — D849 Immunodeficiency, unspecified: Secondary | ICD-10-CM | POA: Insufficient documentation

## 2024-09-10 DIAGNOSIS — G35 Multiple sclerosis: Secondary | ICD-10-CM | POA: Diagnosis not present

## 2024-09-10 DIAGNOSIS — Z960 Presence of urogenital implants: Secondary | ICD-10-CM | POA: Diagnosis not present

## 2024-09-10 DIAGNOSIS — L03116 Cellulitis of left lower limb: Principal | ICD-10-CM | POA: Insufficient documentation

## 2024-09-10 DIAGNOSIS — W050XXA Fall from non-moving wheelchair, initial encounter: Secondary | ICD-10-CM | POA: Diagnosis not present

## 2024-09-10 DIAGNOSIS — G894 Chronic pain syndrome: Secondary | ICD-10-CM | POA: Diagnosis not present

## 2024-09-10 DIAGNOSIS — W19XXXA Unspecified fall, initial encounter: Secondary | ICD-10-CM

## 2024-09-10 DIAGNOSIS — Z86718 Personal history of other venous thrombosis and embolism: Secondary | ICD-10-CM | POA: Diagnosis not present

## 2024-09-10 DIAGNOSIS — L03119 Cellulitis of unspecified part of limb: Secondary | ICD-10-CM

## 2024-09-10 DIAGNOSIS — Y92009 Unspecified place in unspecified non-institutional (private) residence as the place of occurrence of the external cause: Secondary | ICD-10-CM | POA: Diagnosis not present

## 2024-09-10 DIAGNOSIS — M79605 Pain in left leg: Principal | ICD-10-CM

## 2024-09-10 DIAGNOSIS — Z978 Presence of other specified devices: Secondary | ICD-10-CM

## 2024-09-10 LAB — COMPREHENSIVE METABOLIC PANEL WITH GFR
ALT: 20 U/L (ref 0–44)
AST: 24 U/L (ref 15–41)
Albumin: 3.8 g/dL (ref 3.5–5.0)
Alkaline Phosphatase: 107 U/L (ref 38–126)
Anion gap: 17 — ABNORMAL HIGH (ref 5–15)
BUN: 13 mg/dL (ref 8–23)
CO2: 24 mmol/L (ref 22–32)
Calcium: 9.2 mg/dL (ref 8.9–10.3)
Chloride: 98 mmol/L (ref 98–111)
Creatinine, Ser: 0.78 mg/dL (ref 0.44–1.00)
GFR, Estimated: >60 mL/min (ref >60)
Glucose, Bld: 118 mg/dL — ABNORMAL HIGH (ref 70–99)
Potassium: 4 mmol/L (ref 3.5–5.1)
Sodium: 139 mmol/L (ref 135–145)
Total Bilirubin: 1 mg/dL (ref 0.0–1.2)
Total Protein: 7.1 g/dL (ref 6.5–8.1)

## 2024-09-10 LAB — CBC WITH DIFFERENTIAL/PLATELET
Abs Immature Granulocytes: 0.06 K/uL (ref 0.00–0.07)
Basophils Absolute: 0.1 K/uL (ref 0.0–0.1)
Basophils Relative: 1 %
Eosinophils Absolute: 0.1 K/uL (ref 0.0–0.5)
Eosinophils Relative: 1 %
HCT: 42.6 % (ref 36.0–46.0)
Hemoglobin: 13.9 g/dL (ref 12.0–15.0)
Immature Granulocytes: 1 %
Lymphocytes Relative: 15 %
Lymphs Abs: 1.8 K/uL (ref 0.7–4.0)
MCH: 28.2 pg (ref 26.0–34.0)
MCHC: 32.6 g/dL (ref 30.0–36.0)
MCV: 86.4 fL (ref 80.0–100.0)
Monocytes Absolute: 0.8 K/uL (ref 0.1–1.0)
Monocytes Relative: 6 %
Neutro Abs: 9.3 K/uL — ABNORMAL HIGH (ref 1.7–7.7)
Neutrophils Relative %: 76 %
Platelets: 284 K/uL (ref 150–400)
RBC: 4.93 MIL/uL (ref 3.87–5.11)
RDW: 13.9 % (ref 11.5–15.5)
WBC: 12 K/uL — ABNORMAL HIGH (ref 4.0–10.5)
nRBC: 0 % (ref 0.0–0.2)

## 2024-09-10 LAB — LACTIC ACID, PLASMA: Lactic Acid, Venous: 1.2 mmol/L (ref 0.5–1.9)

## 2024-09-10 LAB — APTT: aPTT: 29 s (ref 24–36)

## 2024-09-10 LAB — SEDIMENTATION RATE: Sed Rate: 10 mm/h (ref 0–30)

## 2024-09-10 NOTE — ED Triage Notes (Signed)
 Chief Complaint  Patient presents with   Leg Injury   Pt arrives to ED via EMS after falling at home when transferring from her recliner to wheelchair. No shortening or rotation noted. Swelling noted to pts left leg and foot. Area noted to be swollen, red, and warm touch. Pt arrives to ED with chronic foley in place. Pt alert and oriented to person, place, and situation. Pt denies any pain, reports she takes oxycodone  5 times per day with last dose about 2000. Pt has hx of MS. Lives at home with her significant other.   Past Medical History:  Diagnosis Date   Abnormality of gait 09/26/2013   Arthritis    Duodenal ulcer 02/27/2015   Memory difficulty    Multiple sclerosis (HCC) 09/26/2013   Multiple sclerosis (HCC)    Neurogenic bladder    self caths   Urinary frequency    BP (!) 142/63   Pulse 92   Resp 17   LMP 11/23/2018 (Approximate)   SpO2 100%

## 2024-09-10 NOTE — ED Provider Notes (Signed)
 [facility] Provider Note    Event Date/Time   First MD Initiated Contact with Patient 09/10/24 2235     (approximate)   History   Leg Injury   HPI {Remember to add pertinent medical, surgical, social, and/or OB history to HPI:1} Adrienne Strickland is a 63 y.o. female  ***       Physical Exam   Triage Vital Signs: ED Triage Vitals  Encounter Vitals Group     BP 09/10/24 2130 (!) 142/63     Girls Systolic BP Percentile --      Girls Diastolic BP Percentile --      Boys Systolic BP Percentile --      Boys Diastolic BP Percentile --      Pulse Rate 09/10/24 2130 92     Resp 09/10/24 2130 17     Temp 09/10/24 2136 97.9 F (36.6 C)     Temp src --      SpO2 09/10/24 2130 100 %     Weight 09/10/24 2136 197 lb 5 oz (89.5 kg)     Height --      Head Circumference --      Peak Flow --      Pain Score 09/10/24 2131 0     Pain Loc --      Pain Education --      Exclude from Growth Chart --     Most recent vital signs: Vitals:   09/10/24 2130 09/10/24 2136  BP: (!) 142/63   Pulse: 92   Resp: 17   Temp:  97.9 F (36.6 C)  SpO2: 100%     {Only need to document appropriate and relevant physical exam:1} General: Awake, no distress. *** CV:  Good peripheral perfusion. *** Resp:  Normal effort. *** Abd:  No distention. *** Other:  ***   ED Results / Procedures / Treatments   Labs (all labs ordered are listed, but only abnormal results are displayed) Labs Reviewed - No data to display   EKG  ***   RADIOLOGY *** {USE THE WORD INTERPRETED!! You MUST document your own interpretation of imaging, as well as the fact that you reviewed the radiologist's report!:1}   PROCEDURES:  Critical Care performed: {CriticalCareYesNo:19197::Yes, see critical care procedure note(s),No}  Procedures   MEDICATIONS ORDERED IN ED: Medications - No data to display   IMPRESSION / MDM / ASSESSMENT AND PLAN / ED COURSE  I reviewed the triage  vital signs and the nursing notes.                              Differential diagnosis includes, but is not limited to, ***  Patient's presentation is most consistent with {EM COPA:27473}  *** {If the patient is on the monitor, remove the brackets and asterisks on the sentence below and remember to document it as a Procedure as well. Otherwise delete the sentence below:1} {**The patient is on the cardiac monitor to evaluate for evidence of arrhythmia and/or significant heart rate changes.**} {Remember to include, when applicable, any/all of the following data: independent review of imaging independent review of labs (comment specifically on pertinent positives and negatives) review of specific prior hospitalizations, PCP/specialist notes, etc. discuss meds given and prescribed document any discussion with consultants (including hospitalists) any clinical decision tools you used and why (PECARN, NEXUS, etc.) did you consider admitting the patient? document social determinants of health affecting patient's care (homelessness, inability  to follow up in a timely fashion, etc) document any pre-existing conditions increasing risk on current visit (e.g. diabetes and HTN increasing danger of high-risk chest pain/ACS) describes what meds you gave (especially parenteral) and why any other interventions?:1}     FINAL CLINICAL IMPRESSION(S) / ED DIAGNOSES   Final diagnoses:  None     Rx / DC Orders   ED Discharge Orders     None        Note:  This document was prepared using Dragon voice recognition software and may include unintentional dictation errors.

## 2024-09-11 ENCOUNTER — Emergency Department

## 2024-09-11 DIAGNOSIS — Z86718 Personal history of other venous thrombosis and embolism: Secondary | ICD-10-CM

## 2024-09-11 DIAGNOSIS — Z978 Presence of other specified devices: Secondary | ICD-10-CM

## 2024-09-11 DIAGNOSIS — L03116 Cellulitis of left lower limb: Secondary | ICD-10-CM

## 2024-09-11 DIAGNOSIS — Y92009 Unspecified place in unspecified non-institutional (private) residence as the place of occurrence of the external cause: Secondary | ICD-10-CM

## 2024-09-11 DIAGNOSIS — Z9189 Other specified personal risk factors, not elsewhere classified: Secondary | ICD-10-CM

## 2024-09-11 LAB — HIV ANTIBODY (ROUTINE TESTING W REFLEX): HIV Screen 4th Generation wRfx: NONREACTIVE

## 2024-09-11 MED ORDER — FUROSEMIDE 20 MG PO TABS
20.0000 mg | ORAL_TABLET | Freq: Every day | ORAL | Status: DC
  Administered 2024-09-11 – 2024-09-12 (×2): 20 mg via ORAL
  Filled 2024-09-11 (×2): qty 1

## 2024-09-11 MED ORDER — VANCOMYCIN HCL 2000 MG/400ML IV SOLN
2000.0000 mg | Freq: Once | INTRAVENOUS | Status: AC
Start: 2024-09-11 — End: 2024-09-11
  Administered 2024-09-11: 2000 mg via INTRAVENOUS
  Filled 2024-09-11: qty 400

## 2024-09-11 MED ORDER — ACETAMINOPHEN 325 MG PO TABS: 650.0000 mg | ORAL_TABLET | Freq: Four times a day (QID) | ORAL | Status: DC | PRN

## 2024-09-11 MED ORDER — ONDANSETRON HCL 4 MG PO TABS: 4.0000 mg | ORAL_TABLET | Freq: Four times a day (QID) | ORAL | Status: DC | PRN

## 2024-09-11 MED ORDER — KETOROLAC TROMETHAMINE 30 MG/ML IJ SOLN: 30.0000 mg | Freq: Four times a day (QID) | INTRAMUSCULAR | Status: DC | PRN

## 2024-09-11 MED ORDER — VANCOMYCIN HCL IN DEXTROSE 1-5 GM/200ML-% IV SOLN
1000.0000 mg | Freq: Two times a day (BID) | INTRAVENOUS | Status: DC
  Administered 2024-09-11: 1000 mg via INTRAVENOUS
  Filled 2024-09-11: qty 200

## 2024-09-11 MED ORDER — ACETAMINOPHEN 325 MG RE SUPP: 650.0000 mg | Freq: Four times a day (QID) | RECTAL | Status: DC | PRN

## 2024-09-11 MED ORDER — DALFAMPRIDINE ER 10 MG PO TB12: 10.0000 mg | ORAL_TABLET | Freq: Two times a day (BID) | ORAL | Status: DC

## 2024-09-11 MED ORDER — CHLORHEXIDINE GLUCONATE CLOTH 2 % EX PADS
6.0000 | MEDICATED_PAD | Freq: Every day | CUTANEOUS | Status: DC
  Administered 2024-09-12: 6 via TOPICAL

## 2024-09-11 MED ORDER — SODIUM CHLORIDE 0.9 % IV SOLN
2.0000 g | INTRAVENOUS | Status: DC
Start: 2024-09-11 — End: 2024-09-12
  Administered 2024-09-11 – 2024-09-12 (×2): 2 g via INTRAVENOUS
  Filled 2024-09-11 (×2): qty 20

## 2024-09-11 MED ORDER — ENOXAPARIN SODIUM 60 MG/0.6ML IJ SOSY
45.0000 mg | PREFILLED_SYRINGE | INTRAMUSCULAR | Status: DC
  Administered 2024-09-11: 45 mg via SUBCUTANEOUS
  Filled 2024-09-11 (×2): qty 0.6

## 2024-09-11 MED ORDER — DIMETHYL FUMARATE 240 MG PO CPDR: 1.0000 | DELAYED_RELEASE_CAPSULE | Freq: Every day | ORAL | Status: DC

## 2024-09-11 MED ORDER — OXYCODONE HCL 5 MG PO TABS
5.0000 mg | ORAL_TABLET | ORAL | Status: DC | PRN
  Administered 2024-09-11 – 2024-09-12 (×4): 5 mg via ORAL
  Filled 2024-09-11 (×4): qty 1

## 2024-09-11 MED ORDER — BACLOFEN 10 MG PO TABS
10.0000 mg | ORAL_TABLET | Freq: Three times a day (TID) | ORAL | Status: DC
  Administered 2024-09-11 – 2024-09-12 (×4): 10 mg via ORAL
  Filled 2024-09-11 (×4): qty 1

## 2024-09-11 MED ORDER — ACETAMINOPHEN 650 MG RE SUPP: 650.0000 mg | Freq: Four times a day (QID) | RECTAL | Status: DC | PRN

## 2024-09-11 MED ORDER — GABAPENTIN 100 MG PO CAPS
200.0000 mg | ORAL_CAPSULE | Freq: Two times a day (BID) | ORAL | Status: DC
  Administered 2024-09-11 – 2024-09-12 (×3): 200 mg via ORAL
  Filled 2024-09-11 (×3): qty 2

## 2024-09-11 MED ORDER — ONDANSETRON HCL 4 MG/2ML IJ SOLN: 4.0000 mg | Freq: Four times a day (QID) | INTRAMUSCULAR | Status: DC | PRN

## 2024-09-11 NOTE — Progress Notes (Signed)
 Pharmacy Antibiotic Note  Adrienne Strickland is a 63 y.o. female admitted on 09/10/2024 with cellulitis.  Pharmacy has been consulted for Vancomycin  dosing.  Plan: Pt given Vancomycin  2000 mg once. Vancomycin  1000 mg IV Q 12 hrs. Goal AUC 400-550. Expected AUC: 509.1 SCr used: 0.78  Pharmacy will continue to follow and will adjust abx dosing whenever warranted.  Temp (24hrs), Avg:98.2 F (36.8 C), Min:97.9 F (36.6 C), Max:98.5 F (36.9 C)   Recent Labs  Lab 09/10/24 2326  WBC 12.0*  CREATININE 0.78  LATICACIDVEN 1.2    Estimated Creatinine Clearance: 81.6 mL/min (by C-G formula based on SCr of 0.78 mg/dL).    Allergies  Allergen Reactions   Sulfa Antibiotics Nausea And Vomiting   Tetanus Toxoid-Containing Vaccines Swelling    Extreme swelling at injection site    Antimicrobials this admission: 9/17 Ceftriaxone  >>  9/17 Vancomycin  >>   Microbiology results: 9/16 BCx: Pending  Thank you for allowing pharmacy to be a part of this patient's care.  Rankin Adrienne Strickland, PharmD, [facility] 09/11/2024 4:03 AM

## 2024-09-11 NOTE — ED Notes (Signed)
 US  at bedside.

## 2024-09-11 NOTE — Progress Notes (Signed)
 Patient seen in the ER no family at bedside. Came in after she had mechanical fall at home without major trauma noticed left foot swelling along with redness. Started on broad-spectrum IV antibiotic with Rocephin  and vancomycin . Imaging studies shows no fracture. Advised patient to keep legs elevated will continue IV Rocephin . No indication for vancomycin  at present. Blood cultures remain negative. Resume home meds. Patient has chronic Foley catheter due to neurogenic bladder from MS. She follows with [facility] at bedside. Has an up upcoming appointment every six weeks gets Foley catheter changed.  No family at bedside  No charge

## 2024-09-11 NOTE — H&P (Addendum)
 History and Physical    Patient: Adrienne Strickland FMW:[id number] DOB: [birth year] DOA: 09/10/2024 DOS: the patient was seen and examined on 09/11/2024 PCP: Derenda Rockers, MD  Patient coming from: Home  Chief Complaint:  Chief Complaint  Patient presents with   Leg Injury    HPI: Adrienne Strickland is a 63 y.o. 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  Patient reports pain and swelling left lower leg for the past couple days that caused her to fall when transferring from the wheelchair to her recliner at home.  History provided in part by significant other at the bedside.  She denies other injury.  She denies fever or chills. In the ED, vitals mostly unremarkable, Labs notable for WBC 12,000 and lactic acid 1.2.  CBC and CMP otherwise unremarkable.  Sed rate 10. Left lower extremity Doppler negative for DVT X-ray left foot showing persistent soft tissue swelling without bony injury Patient started on vancomycin  Admission requested.     Review of Systems: As mentioned in the history of present illness. All other systems reviewed and are negative.  Past Medical History:  Diagnosis Date   Abnormality of gait 09/26/2013   Arthritis    Duodenal ulcer 02/27/2015   Memory difficulty    Multiple sclerosis (HCC) 09/26/2013   Multiple sclerosis (HCC)    Neurogenic bladder    self caths   Urinary frequency    Past Surgical History:  Procedure Laterality Date   BOTOX  INJECTION N/A 12/13/2017   Procedure: BOTOX  INJECTION;  Surgeon: Penne Knee, MD;  Location: [facility] ORS;  Service: Urology;  Laterality: N/A;   CHOLECYSTECTOMY     COLONOSCOPY WITH PROPOFOL  N/A 05/22/2018   Procedure: COLONOSCOPY WITH PROPOFOL ;  Surgeon: Gaylyn Gladis PENNER, MD;  Location: [facility] ENDOSCOPY;  Service: Endoscopy;   Laterality: N/A;   CYSTOSCOPY N/A 12/13/2017   Procedure: CYSTOSCOPY;  Surgeon: Penne Knee, MD;  Location: [facility] ORS;  Service: Urology;  Laterality: N/A;   GALLBLADDER SURGERY     Social History:  reports that she has never smoked. She has never been exposed to tobacco smoke. She has never used smokeless tobacco. She reports current alcohol use. She reports that she does not use drugs.  Allergies  Allergen Reactions   Sulfa Antibiotics Nausea And Vomiting   Tetanus Toxoid-Containing Vaccines Swelling    Extreme swelling at injection site    Family History  Problem Relation Age of Onset   Seizures Mother    Emphysema Father    Heart disease Brother    Kidney disease Neg Hx    Prostate cancer Neg Hx    Bladder Cancer Neg Hx     Prior to Admission medications   Medication Sig Start Date End Date Taking? Authorizing Provider  baclofen  (LIORESAL ) 10 MG tablet Take 10 mg by mouth 3 (three) times daily. 06/07/23   [provider]  Cholecalciferol  (VITAMIN D -3) 1000 UNITS CAPS Take 1,000 Units by mouth daily.     [provider]  dalfampridine  10 MG TB12 Take 1 tablet by mouth twice daily 12 hours apart as directed by physician 07/18/22 12/02/24  [provider]  Dimethyl Fumarate  240 MG CPDR Take 1 capsule by mouth daily. 07/22/23   [provider]  donepezil  (ARICEPT ) 10 MG tablet Take 10 mg by mouth 2 (two) times daily. 09/15/21  [provider]  fluticasone  (FLONASE ) 50 MCG/ACT nasal spray Place 2 sprays into both nostrils daily. 08/08/15   Van Knee, MD  furosemide  (LASIX ) 20 MG tablet Take 1 tablet by mouth daily. 12/05/23 12/04/24  [provider]  gabapentin  (NEURONTIN ) 100 MG capsule Take 200 mg by mouth 2 (two) times daily. 10/25/23 10/24/24  [provider]  memantine  (NAMENDA ) 10 MG tablet Take 1 tablet by mouth 2 (two) times daily. 07/19/21   [provider]  oxyCODONE  (OXY IR/ROXICODONE ) 5 MG  immediate release tablet Take one tablet by mouth every 6 hours as needed for pain.  Take 2 tablets for first AM dose. 03/03/23   [provider]  polyethylene glycol powder (GLYCOLAX /MIRALAX ) 17 GM/SCOOP powder Take 119 g by mouth daily. 06/21/24   Helon Kirsch A, PA-C  vitamin B-12 (CYANOCOBALAMIN ) 1000 MCG tablet Take 1,000 mcg by mouth daily.    [provider]    Physical Exam: Vitals:   09/11/24 0000 09/11/24 0015 09/11/24 0030 09/11/24 0149  BP:    (!) 115/54  Pulse: 89 82 81 93  Resp: 18 (!) 21 14 20   Temp:    98.5 F (36.9 C)  TempSrc:    Oral  SpO2: 99% 99% 100% 97%  Weight:       Physical Exam Vitals and nursing note reviewed.  Constitutional:      General: She is not in acute distress. HENT:     Head: Normocephalic and atraumatic.  Cardiovascular:     Rate and Rhythm: Normal rate and regular rhythm.     Heart sounds: Normal heart sounds.  Pulmonary:     Effort: Pulmonary effort is normal.     Breath sounds: Normal breath sounds.  Abdominal:     Palpations: Abdomen is soft.     Tenderness: There is no abdominal tenderness.  Musculoskeletal:     Comments: See pics below  Neurological:     Mental Status: Mental status is at baseline.        Labs on Admission: I have personally reviewed following labs and imaging studies  CBC: Recent Labs  Lab 09/10/24 2326  WBC 12.0*  NEUTROABS 9.3*  HGB 13.9  HCT 42.6  MCV 86.4  PLT 284   Basic Metabolic Panel: Recent Labs  Lab 09/10/24 2326  NA 139  K 4.0  CL 98  CO2 24  GLUCOSE 118*  BUN 13  CREATININE 0.78  CALCIUM 9.2   GFR: Estimated Creatinine Clearance: 81.6 mL/min (by C-G formula based on SCr of 0.78 mg/dL). Liver Function Tests: Recent Labs  Lab 09/10/24 2326  AST 24  ALT 20  ALKPHOS 107  BILITOT 1.0  PROT 7.1  ALBUMIN 3.8   No results for input(s): LIPASE, AMYLASE in the last 168 hours. No results for input(s): AMMONIA in the last 168 hours. Coagulation  Profile: No results for input(s): INR, PROTIME in the last 168 hours. Cardiac Enzymes: No results for input(s): CKTOTAL, CKMB, CKMBINDEX, TROPONINI in the last 168 hours. BNP (last 3 results) No results for input(s): PROBNP in the last 8760 hours. HbA1C: No results for input(s): HGBA1C in the last 72 hours. CBG: No results for input(s): GLUCAP in the last 168 hours. Lipid Profile: No results for input(s): CHOL, HDL, LDLCALC, TRIG, CHOLHDL, LDLDIRECT in the last 72 hours. Thyroid Function Tests: No results for input(s): TSH, T4TOTAL, FREET4, T3FREE, THYROIDAB in the last 72 hours. Anemia Panel: No results for input(s): VITAMINB12, FOLATE, FERRITIN, TIBC, IRON, RETICCTPCT in the  last 72 hours. Urine analysis:    Component Value Date/Time   COLORURINE STRAW (A) 04/03/2023 1340   APPEARANCEUR HAZY (A) 04/03/2023 1340   APPEARANCEUR Cloudy (A) 06/30/2021 1020   LABSPEC 1.010 04/03/2023 1340   PHURINE 6.5 04/03/2023 1340   GLUCOSEU NEGATIVE 04/03/2023 1340   HGBUR MODERATE (A) 04/03/2023 1340   BILIRUBINUR NEGATIVE 04/03/2023 1340   BILIRUBINUR Negative 06/30/2021 1020   KETONESUR NEGATIVE 04/03/2023 1340   PROTEINUR NEGATIVE 04/03/2023 1340   NITRITE NEGATIVE 04/03/2023 1340   LEUKOCYTESUR MODERATE (A) 04/03/2023 1340    Radiological Exams on Admission: US  Venous Img Lower Unilateral Left Result Date: 09/11/2024 CLINICAL DATA:  Left lower extremity pain and swelling EXAM: LEFT LOWER EXTREMITY VENOUS DOPPLER ULTRASOUND TECHNIQUE: Gray-scale sonography with graded compression, as well as color Doppler and duplex ultrasound were performed to evaluate the lower extremity deep venous systems from the level of the common femoral vein and including the common femoral, femoral, profunda femoral, popliteal and calf veins including the posterior tibial, peroneal and gastrocnemius veins when visible. The superficial great saphenous vein was also  interrogated. Spectral Doppler was utilized to evaluate flow at rest and with distal augmentation maneuvers in the common femoral, femoral and popliteal veins. COMPARISON:  None Available. FINDINGS: Contralateral Common Femoral Vein: Respiratory phasicity is normal and symmetric with the symptomatic side. No evidence of thrombus. Normal compressibility. Common Femoral Vein: No evidence of thrombus. Normal compressibility, respiratory phasicity and response to augmentation. Saphenofemoral Junction: No evidence of thrombus. Normal compressibility and flow on color Doppler imaging. Profunda Femoral Vein: No evidence of thrombus. Normal compressibility and flow on color Doppler imaging. Femoral Vein: No evidence of thrombus. Normal compressibility, respiratory phasicity and response to augmentation. Popliteal Vein: No evidence of thrombus. Normal compressibility, respiratory phasicity and response to augmentation. Calf Veins: No evidence of thrombus. Normal compressibility and flow on color Doppler imaging. Superficial Great Saphenous Vein: No evidence of thrombus. Normal compressibility. Venous Reflux:  None. Other Findings:  None. IMPRESSION: No evidence of deep venous thrombosis. Electronically Signed   By: Oneil Devonshire M.D.   On: 09/11/2024 02:41   DG Foot 2 Views Left Result Date: 09/10/2024 CLINICAL DATA:  Foot pain and swelling EXAM: LEFT FOOT - 2 VIEW COMPARISON:  04/08/2024 FINDINGS: Generalized soft tissue swelling is noted similar to that seen on the prior exam particularly over the metatarsals. The soft tissue wound noted adjacent to the calcaneus is not as well visualized on today's exam. No acute fracture or dislocation is noted. No erosive changes are seen. IMPRESSION: Persistent soft tissue swelling. No acute bony abnormality is noted. Electronically Signed   By: Oneil Devonshire M.D.   On: 09/10/2024 23:19   Data Reviewed for HPI: Relevant notes from primary care and specialist visits, past discharge  summaries as available in EHR, including Care Everywhere. Prior diagnostic testing as pertinent to current admission diagnoses Updated medications and problem lists for reconciliation ED course, including vitals, labs, imaging, treatment and response to treatment Triage notes, nursing and pharmacy notes and ED provider's notes Notable results as noted above in HPI      Assessment and Plan: * Cellulitis of left lower leg At risk of severe infection due to chronic immunosuppression Vancomycin  and Rocephin  Keep leg elevated Pain control  Fall at home, initial encounter Nonambulant status Patient is able to transfer but unable to bear weight X-ray foot without injury Clemens twice in the past 4 days however has not fallen in several months prior to that Consider  PT/OT consult  Multiple sclerosis (HCC) No acute issues suspected Continue home meds  Chronic indwelling Foley catheter Neurogenic bladder Exchanges Foley every 6 weeks-last exchange 3 weeks ago  Chronic pain syndrome Continue baclofen , gabapentin  and oxycodone   History of DVT (deep vein thrombosis) Venous ultrasound negative for DVT     DVT prophylaxis: Lovenox   Consults: none  Advance Care Planning:   Code Status: Prior   Family Communication:   Disposition Plan: Back to previous home environment  Severity of Illness: The appropriate patient status for this patient is OBSERVATION. Observation status is judged to be reasonable and necessary in order to provide the required intensity of service to ensure the patient's safety. The patient's presenting symptoms, physical exam findings, and initial radiographic and laboratory data in the context of their medical condition is felt to place them at decreased risk for further clinical deterioration. Furthermore, it is anticipated that the patient will be medically stable for discharge from the hospital within 2 midnights of admission.   Author: Delayne LULLA Solian,  MD 09/11/2024 3:46 AM  For on call review [email or web address].

## 2024-09-11 NOTE — Assessment & Plan Note (Addendum)
 At risk of severe infection due to chronic immunosuppression Vancomycin  and Rocephin  Keep leg elevated Pain control

## 2024-09-11 NOTE — Assessment & Plan Note (Signed)
 Venous ultrasound negative for DVT.

## 2024-09-11 NOTE — Assessment & Plan Note (Signed)
-   Continue memantine and donepezil

## 2024-09-11 NOTE — Assessment & Plan Note (Addendum)
 No acute issues suspected Continue home meds

## 2024-09-11 NOTE — ED Notes (Signed)
 Patient assisted to reposition while eating breakfast tray. This RN helped patient open containers for breakfast. Patient denies other needs at this time.

## 2024-09-11 NOTE — Plan of Care (Signed)

## 2024-09-11 NOTE — ED Notes (Signed)
 MD Tobie notified of patient's decreased BP. No new orders at this time.

## 2024-09-11 NOTE — Assessment & Plan Note (Signed)
 Neurogenic bladder Exchanges Foley every 6 weeks-last exchange 3 weeks ago

## 2024-09-11 NOTE — ED Notes (Addendum)
 Pt urinary catheter emptied. Urine noted to have foul odor. Pt removed from previous brief that was placed. Pt cleaned by this RN and Candida, RN with bath wipes and pulled up in the bed. Pt denies further complaints. Pt noted to have redness on buttocks but no skin breakdown noted. This RN offered to assist pt with lying on her side and tucking a pillow underneath her. Pt reports Thank you for offering to help. But, I would rather stay on my back. Pt also noted to have redness in her face. Pt reports to this RN I feel fine. My face gets flushed a lot and that's nothing new.

## 2024-09-11 NOTE — ED Provider Notes (Signed)
 [facility] Provider Note    Event Date/Time   First MD Initiated Contact with Patient 09/10/24 2258     (approximate)   History   Leg Injury   HPI  ENMA MAEDA is a 63 y.o. female very pleasant female with a past medical history of multiple sclerosis on immunosuppresants medications for such followed by [facility] neurology, prior history of DVT in adolescence, chronic injuring Foley who presents with several days of progressively worsening left lower extremity swelling erythema and pain especially over the dorsum of her foot.  Along with a slide off of a wheelchair to her knees today.  History is obtained both by the patient and her husband at bedside.  They have noticed for the past several days progressively pain and swelling of her left lower extremity.  Today while she was attempting to transfer from her wheelchair to the recliner, she misjudged the distance and slid slowly to her knees.  There was no head strike she denies any headache and she is not on any anticoagulation.  She denies any hip pain or knee pain.      Physical Exam   Triage Vital Signs: ED Triage Vitals  Encounter Vitals Group     BP 09/10/24 2130 (!) 142/63     Girls Systolic BP Percentile --      Girls Diastolic BP Percentile --      Boys Systolic BP Percentile --      Boys Diastolic BP Percentile --      Pulse Rate 09/10/24 2130 92     Resp 09/10/24 2130 17     Temp 09/10/24 2136 97.9 F (36.6 C)     Temp Source 09/11/24 0149 Oral     SpO2 09/10/24 2130 100 %     Weight 09/10/24 2136 197 lb 5 oz (89.5 kg)     Height --      Head Circumference --      Peak Flow --      Pain Score 09/10/24 2131 0     Pain Loc --      Pain Education --      Exclude from Growth Chart --     Most recent vital signs: Vitals:   09/11/24 0030 09/11/24 0149  BP:  (!) 115/54  Pulse: 81 93  Resp: 14 20  Temp:  98.5 F (36.9 C)  SpO2: 100% 97%    Nursing Triage Note reviewed. Vital signs  reviewed and patients oxygen saturation is normoxic  General: Patient is well nourished, well developed, awake and alert, resting comfortably Head: Normocephalic and atraumatic Eyes: Normal inspection, extraocular muscles intact, no conjunctival pallor Ear, nose, throat: Normal external exam Neck: Normal range of motion, no C-spine tenderness to palpation Respiratory: Patient is in no respiratory distress, lungs CTAB Cardiovascular: Patient is not tachycardic, RRR without murmur appreciated GI: Abd SNT with no guarding or rebound  Back: Normal inspection of the back with good strength and range of motion throughout all ext Extremities: pulses intact with good cap refills,  Left lower extremity is edematous and erythematous especially of the dorsum of the foot extending to mid calf.  There was a DP pulse with less than 2-second cap refill in all digits.  Able to straight leg lift bilaterally and flex at the knee bilaterally, there is no crepitus over the foot  Neuro: The patient is alert and oriented to person, place, and time, appropriately conversive, with 5/5 bilat UE/LE strength, no gross motor  or sensory defects noted. Coordination appears to be adequate. Skin: Warm, dry, and intact Psych: normal mood and affect, no SI or HI  ED Results / Procedures / Treatments   Labs (all labs ordered are listed, but only abnormal results are displayed) Labs Reviewed  CBC WITH DIFFERENTIAL/PLATELET - Abnormal; Notable for the following components:      Result Value   WBC 12.0 (*)    Neutro Abs 9.3 (*)    All other components within normal limits  COMPREHENSIVE METABOLIC PANEL WITH GFR - Abnormal; Notable for the following components:   Glucose, Bld 118 (*)    Anion gap 17 (*)    All other components within normal limits  CULTURE, BLOOD (ROUTINE X 2)  CULTURE, BLOOD (ROUTINE X 2)  APTT  SEDIMENTATION RATE  LACTIC ACID, PLASMA  HIV ANTIBODY (ROUTINE TESTING W REFLEX)      EKG None  RADIOLOGY Xray left foot: Soft tissue edema no fracture or gas tracking US  Doppler LE: No DVT    PROCEDURES:  Critical Care performed: No  Procedures   MEDICATIONS ORDERED IN ED: Medications  vancomycin  (VANCOREADY) IVPB 2000 mg/400 mL (2,000 mg Intravenous New Bag/Given 09/11/24 0329)  oxyCODONE  (Oxy IR/ROXICODONE ) immediate release tablet 5 mg (has no administration in time range)  furosemide  (LASIX ) tablet 20 mg (has no administration in time range)  baclofen  (LIORESAL ) tablet 10 mg (has no administration in time range)  gabapentin  (NEURONTIN ) capsule 200 mg (has no administration in time range)  enoxaparin  (LOVENOX ) injection 45 mg (has no administration in time range)  acetaminophen  (TYLENOL ) tablet 650 mg (has no administration in time range)    Or  acetaminophen  (TYLENOL ) suppository 650 mg (has no administration in time range)  ondansetron  (ZOFRAN ) tablet 4 mg (has no administration in time range)    Or  ondansetron  (ZOFRAN ) injection 4 mg (has no administration in time range)  ketorolac  (TORADOL ) 30 MG/ML injection 30 mg (has no administration in time range)  cefTRIAXone  (ROCEPHIN ) 2 g in sodium chloride  0.9 % 100 mL IVPB (has no administration in time range)  vancomycin  (VANCOCIN ) IVPB 1000 mg/200 mL premix (has no administration in time range)  Dimethyl Fumarate  CPDR 240 mg (has no administration in time range)  dalfampridine  TB12 10 mg (has no administration in time range)     IMPRESSION / MDM / ASSESSMENT AND PLAN / ED COURSE                                Differential diagnosis includes, but is not limited to, cellulitis, deep space infection, DVT, bacteremia   ED course: Patient presents with 2 days of significant lower extremity cellulitis.  She had a low mechanism fall and has no evidence of any traumatic injuries.  The cellulitis is significant but likely she has no elevation of inflammatory markers.  This may be secondary to her  immunosuppression status arrival.  Given prior history a Doppler ultrasound was ordered which demonstrated no DVT.  Will start the patient on vancomycin  and call her into the hospitalist today to monitor for improvement.    Clinical Course as of 09/11/24 0452  Wed Sep 11, 2024  0025 WBC(!): 12.0 There is a leukocytosis [HD]  0025 Lactic Acid, Venous: 1.2 Lactic acid is not elevated [HD]  0025 Sed Rate: 10 Sed rate not elevated [HD]  0025 DG Foot 2 Views Left No gas tracking [HD]  0156 Discussed with Doppler ultrasound  and they will do her next [HD]    Clinical Course User Index [HD] Nicholaus Rolland BRAVO, MD   -- Risk: 5 This patient has a high risk of morbidity due to further diagnostic testing or treatment. Rationale: This patient's evaluation and management involve a high risk of morbidity due to the potential severity of presenting symptoms, need for diagnostic testing, and/or initiation of treatment that may require close monitoring. The differential includes conditions with potential for significant deterioration or requiring escalation of care. Treatment decisions in the ED, including medication administration, procedural interventions, or disposition planning, reflect this level of risk. COPA: 5 The patient has the following acute or chronic illness/injury that poses a possible threat to life or bodily function: [X] : The patient has a potentially serious acute condition or an acute exacerbation of a chronic illness requiring urgent evaluation and management in the Emergency Department. The clinical presentation necessitates immediate consideration of life-threatening or function-threatening diagnoses, even if they are ultimately ruled out.   FINAL CLINICAL IMPRESSION(S) / ED DIAGNOSES   Final diagnoses:  Pain of left lower extremity  Cellulitis of foot     Rx / DC Orders   ED Discharge Orders     None        Note:  This document was prepared using Dragon voice  recognition software and may include unintentional dictation errors.   Nicholaus Rolland BRAVO, MD 09/11/24 [phone]

## 2024-09-11 NOTE — Progress Notes (Signed)
 PHARMACIST - PHYSICIAN COMMUNICATION  CONCERNING:  Enoxaparin  (Lovenox ) for DVT Prophylaxis    RECOMMENDATION: Patient was prescribed enoxaprin 40mg  q24 hours for VTE prophylaxis.   Filed Weights   09/10/24 2136  Weight: 89.5 kg (197 lb 5 oz)    Body mass index is 32.83 kg/m.  Estimated Creatinine Clearance: 81.6 mL/min (by C-G formula based on SCr of 0.78 mg/dL).   Based on [facility] policy patient is candidate for enoxaparin  0.5mg /kg TBW SQ every 24 hours based on BMI being >30.  DESCRIPTION: Pharmacy has adjusted enoxaparin  dose per [facility] policy.  Patient is now receiving enoxaparin  0.5 mg/kg every 24 hours   Rankin [name], PharmD, [facility] 09/11/2024 3:57 AM

## 2024-09-11 NOTE — Assessment & Plan Note (Addendum)
 Nonambulant status Patient is able to transfer but unable to bear weight X-ray foot without injury Clemens twice in the past 4 days however has not fallen in several months prior to that Consider PT/OT consult

## 2024-09-11 NOTE — Assessment & Plan Note (Signed)
 Continue baclofen , gabapentin  and oxycodone 

## 2024-09-12 ENCOUNTER — Encounter: Payer: Self-pay | Admitting: Internal Medicine

## 2024-09-12 ENCOUNTER — Other Ambulatory Visit: Payer: Self-pay

## 2024-09-12 DIAGNOSIS — L03116 Cellulitis of left lower limb: Secondary | ICD-10-CM | POA: Diagnosis not present

## 2024-09-12 LAB — CREATININE, SERUM
Creatinine, Ser: 0.99 mg/dL (ref 0.44–1.00)
GFR, Estimated: >60 mL/min (ref >60)

## 2024-09-12 MED ORDER — CEPHALEXIN 500 MG PO CAPS: 500.0000 mg | ORAL_CAPSULE | Freq: Four times a day (QID) | ORAL | 0 refills | Status: AC

## 2024-09-12 MED ORDER — CEPHALEXIN 500 MG PO CAPS: 500.0000 mg | ORAL_CAPSULE | Freq: Four times a day (QID) | ORAL | Status: DC

## 2024-09-12 NOTE — Discharge Instructions (Addendum)
 Keep left foot elevated   Pt advised to keep her upcoming Urology appt for Catheter change

## 2024-09-12 NOTE — Discharge Summary (Signed)
 Physician Discharge Summary   Patient: Adrienne Strickland MRN: [id number] DOB: [birth year]  Admit date:     09/10/2024  Discharge date: 09/12/24  Discharge Physician: Leita Blanch   PCP: Derenda Rockers, MD   Recommendations at discharge:   follow-up PCP in 1 to 2 weeks patient to keep appointment with urology for catheter change on her schedule  Discharge Diagnoses: Principal Problem:   Cellulitis of left lower leg Active Problems:   At high risk for infection due to immunosuppression   Fall at home, initial encounter   Multiple sclerosis (HCC)   Neurogenic bladder   Chronic indwelling Foley catheter   Chronic pain syndrome   History of DVT (deep vein thrombosis)   Adrienne Strickland is a 63 y.o. 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  Patient reports pain and swelling left lower leg for the past couple days that caused her to fall when transferring from the wheelchair to her recliner at home.   Left lower extremity Doppler negative for DVT X-ray left foot showing persistent soft tissue swelling without bony injury  Cellulitis of left lower leg At risk of severe infection due to chronic immunosuppression --Vancomycin  and Rocephin -->rocephin     -->po kelfex --Keep leg elevated --Pain control -- patient remains afebrile. Swelling improved and redness improved. Switch to oral   Fall at home, initial encounter Nonambulant status --Patient is able to transfer but unable to bear weight --X-ray foot without injury   Multiple sclerosis (HCC) No acute issues suspected Continue home meds   Chronic indwelling Foley catheter Neurogenic bladder Exchanges Foley every 6 weeks-last exchange 3 weeks ago   Chronic pain syndrome Continue baclofen , gabapentin  and oxycodone    History of DVT  (deep vein thrombosis) Venous ultrasound negative for DVT  Overall patient feels better. She will finish up antibiotics at home. Discharge to home. Patient is agreeable. No family at bedside.    Pain control - [place]  Controlled Substance Reporting System database was reviewed. and patient was instructed, not to drive, operate heavy machinery, perform activities at heights, swimming or participation in water activities or provide baby-sitting services while on Pain, Sleep and Anxiety Medications; until their outpatient Physician has advised to do so again. Also recommended to not to take more than prescribed Pain, Sleep and Anxiety Medications.  Disposition: Home Diet recommendation:  Discharge Diet Orders (From admission, onward)     Start     Ordered   09/12/24 0000  Diet - low sodium heart healthy        09/12/24 9061           Regular diet DISCHARGE MEDICATION: Allergies as of 09/12/2024       Reactions   Sulfa Antibiotics Nausea And Vomiting   Tetanus Toxoid-containing Vaccines Swelling   Extreme swelling at injection site        Medication List     TAKE these medications    baclofen  10 MG tablet Commonly known as: LIORESAL  Take 10 mg by mouth 3 (three) times daily.   cephALEXin  500 MG capsule Commonly known as: KEFLEX  Take 1 capsule (500 mg total) by mouth every 6 (six) hours for 5 days. Start taking on: September 13, 2024   cyanocobalamin  1000 MCG tablet Commonly known as: VITAMIN B12 Take 1,000 mcg by mouth daily.   dalfampridine  10 MG Tb12 Take  1 tablet by mouth twice daily 12 hours apart as directed by physician   Dimethyl Fumarate  240 MG Cpdr Take 1 capsule by mouth daily.   donepezil  10 MG tablet Commonly known as: ARICEPT  Take 10 mg by mouth 2 (two) times daily.   fluticasone  50 MCG/ACT nasal spray Commonly known as: FLONASE  Place 2 sprays into both nostrils daily.   furosemide  20 MG tablet Commonly known as: LASIX  Take 1 tablet by  mouth daily.   gabapentin  100 MG capsule Commonly known as: NEURONTIN  Take 200 mg by mouth 2 (two) times daily.   memantine  10 MG tablet Commonly known as: NAMENDA  Take 1 tablet by mouth 2 (two) times daily.   naloxone 4 MG/0.1ML Liqd nasal spray kit Commonly known as: NARCAN Place 4 mg into the nose once.   oxyCODONE  5 MG immediate release tablet Commonly known as: Oxy IR/ROXICODONE  Take one tablet by mouth every 6 hours as needed for pain.  Take 2 tablets for first AM dose.   polyethylene glycol powder 17 GM/SCOOP powder Commonly known as: GLYCOLAX /MIRALAX  Take 119 g by mouth daily.   Vitamin D -3 25 MCG (1000 UT) Caps Take 1,000 Units by mouth daily.        Follow-up Information     Derenda Rockers, MD. Schedule an appointment as soon as possible for a visit in 1 week(s).   Specialty: Internal Medicine Why: hospital f/u Contact information: [address] [facility] [place] [address] [phone]                Discharge Exam: Adrienne Strickland   09/10/24 2136  Weight: 89.5 kg  today   Condition at discharge: fair  The results of significant diagnostics from this hospitalization (including imaging, microbiology, ancillary and laboratory) are listed below for reference.   Imaging Studies: US  Venous Img Lower Unilateral Left Result Date: 09/11/2024 CLINICAL DATA:  Left lower extremity pain and swelling EXAM: LEFT LOWER EXTREMITY VENOUS DOPPLER ULTRASOUND TECHNIQUE: Gray-scale sonography with graded compression, as well as color Doppler and duplex ultrasound were performed to evaluate the lower extremity deep venous systems from the level of the common femoral vein and including the common femoral, femoral, profunda femoral, popliteal and calf veins including the posterior tibial, peroneal and gastrocnemius veins when visible. The superficial great saphenous vein was also interrogated. Spectral Doppler was utilized to evaluate flow at rest and  with distal augmentation maneuvers in the common femoral, femoral and popliteal veins. COMPARISON:  None Available. FINDINGS: Contralateral Common Femoral Vein: Respiratory phasicity is normal and symmetric with the symptomatic side. No evidence of thrombus. Normal compressibility. Common Femoral Vein: No evidence of thrombus. Normal compressibility, respiratory phasicity and response to augmentation. Saphenofemoral Junction: No evidence of thrombus. Normal compressibility and flow on color Doppler imaging. Profunda Femoral Vein: No evidence of thrombus. Normal compressibility and flow on color Doppler imaging. Femoral Vein: No evidence of thrombus. Normal compressibility, respiratory phasicity and response to augmentation. Popliteal Vein: No evidence of thrombus. Normal compressibility, respiratory phasicity and response to augmentation. Calf Veins: No evidence of thrombus. Normal compressibility and flow on color Doppler imaging. Superficial Great Saphenous Vein: No evidence of thrombus. Normal compressibility. Venous Reflux:  None. Other Findings:  None. IMPRESSION: No evidence of deep venous thrombosis. Electronically Signed   By: Oneil Devonshire M.D.   On: 09/11/2024 02:41   DG Foot 2 Views Left Result Date: 09/10/2024 CLINICAL DATA:  Foot pain and swelling EXAM: LEFT FOOT - 2 VIEW COMPARISON:  04/08/2024 FINDINGS:  Generalized soft tissue swelling is noted similar to that seen on the prior exam particularly over the metatarsals. The soft tissue wound noted adjacent to the calcaneus is not as well visualized on today's exam. No acute fracture or dislocation is noted. No erosive changes are seen. IMPRESSION: Persistent soft tissue swelling. No acute bony abnormality is noted. Electronically Signed   By: Oneil Devonshire M.D.   On: 09/10/2024 23:19    Microbiology: Results for orders placed or performed during the hospital encounter of 09/10/24  Blood culture (routine x 2)     Status: None (Preliminary result)    Collection Time: 09/10/24 11:26 PM   Specimen: BLOOD RIGHT ARM  Result Value Ref Range Status   Specimen Description BLOOD RIGHT ARM  Final   Special Requests   Final    BOTTLES DRAWN AEROBIC AND ANAEROBIC Blood Culture results may not be optimal due to an inadequate volume of blood received in culture bottles   Culture   Final    NO GROWTH 2 DAYS Performed at [facility], [address], [place], [place] [address]    Report Status PENDING  Incomplete  Blood culture (routine x 2)     Status: None (Preliminary result)   Collection Time: 09/10/24 11:36 PM   Specimen: BLOOD LEFT HAND  Result Value Ref Range Status   Specimen Description BLOOD LEFT HAND  Final   Special Requests   Final    BOTTLES DRAWN AEROBIC AND ANAEROBIC Blood Culture results may not be optimal due to an inadequate volume of blood received in culture bottles   Culture   Final    NO GROWTH 2 DAYS Performed at [facility], [address] [place], [place] [address]    Report Status PENDING  Incomplete    Labs: CBC: Recent Labs  Lab 09/10/24 2326  WBC 12.0*  NEUTROABS 9.3*  HGB 13.9  HCT 42.6  MCV 86.4  PLT 284   Basic Metabolic Panel: Recent Labs  Lab 09/10/24 2326 09/12/24 0323  NA 139  --   K 4.0  --   CL 98  --   CO2 24  --   GLUCOSE 118*  --   BUN 13  --   CREATININE 0.78 0.99  CALCIUM 9.2  --    Liver Function Tests: Recent Labs  Lab 09/10/24 2326  AST 24  ALT 20  ALKPHOS 107  BILITOT 1.0  PROT 7.1  ALBUMIN 3.8   CBG: No results for input(s): GLUCAP in the last 168 hours.  Discharge time spent: greater than 30 minutes.  Signed: Leita Blanch, MD [facility] 09/12/2024

## 2024-09-12 NOTE — Care Management Obs Status (Signed)
 MEDICARE OBSERVATION STATUS NOTIFICATION   Patient Details  Name: Adrienne Strickland MRN: [id number] Date of Birth: 1961-03-27   Medicare Observation Status Notification Given:  Chaney BRANDY CHRISTIANE LELON, CMA 09/12/2024, 11:27 AM

## 2024-09-15 LAB — CULTURE, BLOOD (ROUTINE X 2)
Culture: NO GROWTH
Culture: NO GROWTH

## 2024-09-16 ENCOUNTER — Ambulatory Visit: Admitting: Urology

## 2024-09-27 NOTE — Progress Notes (Signed)
 Cath Change/ Replacement  Patient is present today for a catheter change due to urinary retention.  26 ml of water was removed from the balloon, a 16 FR silastic foley cath was removed without difficulty.  Patient was cleaned and prepped in a sterile fashion with betadine.  A 16 FR silastic foley cath was replaced into the bladder, no complications were noted. Urine return was noted 20 ml and urine was yellow in color. The balloon was filled with 30ml of sterile water. A leg bag was attached for drainage.  A night bag was also given to the patient and patient was given instruction on how to change from one bag to another. Patient was given proper instruction on catheter care.    Performed by: CLOTILDA CORNWALL, PA-C and Andrea DELENA Kirks, LPN   Follow up: Return in about 6 weeks (around 11/11/2024) for Foley catheter exchange .   They are still not able to have the renal ultrasound as they cannot afford it.

## 2024-09-30 ENCOUNTER — Ambulatory Visit (INDEPENDENT_AMBULATORY_CARE_PROVIDER_SITE_OTHER): Admitting: Urology

## 2024-09-30 VITALS — BP 125/80 | HR 75

## 2024-09-30 DIAGNOSIS — N319 Neuromuscular dysfunction of bladder, unspecified: Secondary | ICD-10-CM | POA: Diagnosis not present

## 2024-10-14 NOTE — Progress Notes (Signed)
 Today the history is gathered from: 50% - patient  50% - patient husband  RECORDS SUMMARY:  REFERRING PHYSICIAN:  PRIMARY CARE PHYSICIAN:    IMPRESSION/PLAN  Adrienne Strickland is a 63 y.o.  female presenting for evaluation of MS.  MULTIPLE SCLEROSIS/ FALLS/ RIGHT KNEE PAIN / WEAKNESS -Stable. -Patient with ongoing MS. Has had two falls since last visit. Ongoing imbalance and difficulty walking. Progressing weakness in bilateral legs. Intermittent muscle spasms at night in legs. Memory, mood and sleep have been well.  Takes Tecfidera  240 daily, Baclofen  10 mg three time a day, Amprya 10 mg BID, Namenda  10 mg BID, Aricept  10 mg night,  Gabapentin  200 mg BID -Reviewed absolute lymphocyte level from 09/10/2024, level was 1.8. Normal.  -Continue taking Tecfidera  240 mg daily for MS.  -Continue taking Ampyra  10 mg twice daily for MS.  -Continue taking Baclofen  10 mg three times daily for muscle spasms.  -Continue taking Namenda  10 mg twice daily for memory loss.  -Continue taking Aricept  10 mg at night for memory loss.  -Continue taking Gabapentin  200 mg twice daily.  -Recommend referral for at home PT/OT. Patient has deferred at this time.  -Recommend repeat brain and cervical spine MRI for evaluation of progression of MS. Patient has deferred at this time.   Follow up with Dr Lane, in 6-12 months   CC/HPI  Adrienne Strickland is a 63 y.o. female presenting for evaluation of: Chief Complaint  Patient presents with  . MULTIPLE SCLEROSIS/ FALLS/ RIGHT KNEE PAIN/ WEAKNESS     MULTIPLE SCLEROSIS/ FALLS/ RIGHT KNEE PAIN/ WEAKNESS  Patient with ongoing MS.  No new exacerbations. Has had two falls since last visit. Recent hospital admission on 09/10/2024 for a muscle strain following a fall. Uses a wheel chair most of the time. Ongoing imbalance and difficulty walking. Progressing weakness in bilateral legs. Intermittent muscle spasms at night in legs. Denies any stiffness, fatigue, numbness, tingling,  changes in vision, tremors, dizziness. Memory has been well, no recent changes. Sleep and mood have been good. No other concerns today. Takes Tecfidera  240 daily, Baclofen  10 mg three time a day, Amprya 10 mg BID, Namenda  10 mg BID, Aricept  10 mg night,  Gabapentin  200 mg BID  ABS LYMPHOCYTE LEVEL  09/10/2024 - 1.8 02/12/2024 - 1.4 02/08/2024 - 1.7 02/16/2023 - 1.35 01/28/2022 - 0.8 12/18/2019 - 1.7 06/19/2019 - 1.3 10/09/2018 - 1.84 05/29/2018 - 0.98   [facility] INFUSION HISTORY  12/18/2019 - 2nd infusion June 2020 - 1st infusion  DATA REVIEW:  12/21/21 MRI BRAIN and CERV SPIN W WO IMPRESSION:  Brain MRI:  Advanced chronic demyelinating disease with atrophy. No active or  progressive finding since 2020.  Cervical MRI:  1. Stable nonenhancing cervical cord plaques since 2020.  2. Disc degeneration at C4-5 to C6-7 with disc protrusions deforming  the ventral cord. Foraminal impingement at these levels, especially  on the left.   06/05/2020 LUMBAR SPINE MRI WO IMPRESSION:  Convex right scoliosis is new since 2014.   Degenerative disease is worst at L4-5 where there is marked  narrowing in the right subarticular recess and foramen with  encroachment on the exiting right L4 and descending right L5 roots.   Disc and prominent dorsal epidural fat cause moderate compression of  the thecal sac at L3-4.   Mild narrowing in the left lateral recess and foramen at L2-3  without nerve root compression.     10/23/2019 MRI CERVICAL SPINE W AND WO IMPRESSION: Multiple cord lesions are present  similar to prior MRI and consistent with demyelinating disease. Hyperintensity in the cord at C4-5 is unchanged and associated with moderate spinal stenosis. This may be due to compressive myelopathy or demyelinating disease.  Central disc protrusion at C3-4 has progressed in the interval causing mild spinal stenosis and mild foraminal narrowing bilaterally. Other degenerative changes are  stable from the prior MRI.  10/23/2019 MRI BRAIN W AND WO IMPRESSION: Unchanged extensive chronic multiple sclerosis. No evidence of acute intracranial abnormality.   11/06/2018 MRI BRAIN W AND WO IMPRESSION: 1. Chronic severe demyelinating disease in the brain with no progression identified since 2016. 2. Chronic widespread heterogeneous cervical spinal cord demyelination with no progression identified. 3. Stable superimposed cervical spine degeneration maximal at C4-C5 and C5-C6.  11/06/2018 MRI CERVICAL SPINE W AND WO IMPRESSION: 1. Chronic severe demyelinating disease in the brain with no progression identified since 2016. 2. Chronic widespread heterogeneous cervical spinal cord demyelination with no progression identified. 3. Stable superimposed cervical spine degeneration maximal at C4-C5 and C5-C6.  10/24/2017 MR CERVICAL SPINE WO CONTRAST  IMPRESSION:  IMPRESSION: C3-4: Central disc herniation which effaces the ventral subarachnoid space and contacts the cord. No foraminal compromise.  C4-5: Chronic spondylosis with endplate osteophytes and protruding disc material. Spinal stenosis with AP diameter of canal only 7 mm. Indentation of the cord. Bilateral neural foraminal stenosis. Abnormal T2 signal affecting the cord from mid C4 to mid C5 as seen previously. This could be due to demyelinating disease or could be compressive myelopathy. Similar appearance to the previous exam.  C5-6: Spondylosis with canal stenosis and bilateral foraminal stenosis. AP diameter of the canal only 7 mm. No abnormal cord signal centered at this disc space.  C6-7: Spondylosis with right paracentral disc herniation. Canal AP diameter as narrow as 8 mm. No foraminal stenosis.  10/24/2017 MR BRAIN W WO CONTRAST  IMPRESSION:  Stable examination since 2016. Extensive widespread demyelinating lesions throughout the brain a generalized atrophy. No new or progressive lesions. No lesions show  restricted diffusion or contrast enhancement.  PAST MEDICAL HISTORY Past Medical History:  Diagnosis Date  . 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 Catheter (urine) change required 09/09/2019  . Constipation   . Duodenal ulcer 02/27/2015   one Circumferential  . Erosive esophagitis 02/27/2015   LA Grade C  . Esophagitis, [place] grade B 03/02/2015  . Gait instability    uses a walker or cane  . Mild cognitive impairment 04/19/2013   short term memory loss, difficulty with word finding  . MS (multiple sclerosis)    Dr. Lane is neurologist  . Neurogenic bladder 05/25/2007   self caths 6 times a day    PAST SURGICAL HISTORY Past Surgical History:  Procedure Laterality Date  . COLONOSCOPY W/BIOPSY N/A 05/21/2014   Procedure: SCREENING COLONOSCOPY;  Surgeon: Charlie Franky Debarah Mickey., MD;  Location: [facility];  Service: Gastroenterology;  Laterality: N/A;  . EGD  02/27/2015   gastritis/esophagitis/Repeat 8weeks/MUS  . EGD  03/02/2015   gastritis/esophagitis/Repeat 8weeks/MUS  . COLONOSCOPY  05/22/2018   Melanosis coli/Negative biopsy/Repeat 74yrs/MUS  . CHOLECYSTECTOMY      FAMILY HISTORY Family History  Problem Relation Name Age of Onset  . High blood pressure (Hypertension) Mother    . COPD Father    . Neurofibromatosis Father      SOCIAL HISTORY  Social History   Tobacco Use  . Smoking status: Never  . Smokeless tobacco: Never  Vaping Use  . Vaping status: Never Used  Substance Use  Topics  . Alcohol use: Yes    Alcohol/week: 7.0 standard drinks of alcohol    Types: 7 Glasses of wine per week    Comment: pt states she has a glass of wine or a shot of vodka every day, one or the other  . Drug use: No    MEDICATIONS Current Outpatient Medications  Medication Sig Dispense Refill  . baclofen  (LIORESAL ) 10 MG tablet TAKE 1 TABLET BY MOUTH 3 TIMES  DAILY 90 tablet 0  . cholecalciferol  (VITAMIN D3) 1000 unit tablet Take by mouth.    . cyanocobalamin   (VITAMIN B12) 1000 MCG tablet Take by mouth.    . dalfampridine  10 mg Tb12 TAKE 1 TABLET BY MOUTH EVERY 12 HOURS AS DIRECTED BY PHYSICIAN 60 tablet 1  . dimethyl fumarate  (TECFIDERA ) 240 mg DR capsule TAKE 1 CAPSULE BY MOUTH ONCE DAILY 60 capsule 0  . donepeziL  (ARICEPT ) 10 MG tablet TAKE 1 TABLET BY MOUTH DAILY AT  NIGHT 100 tablet 2  . gabapentin  (NEURONTIN ) 100 MG capsule TAKE 1 CAP TWICE A DAY FOR ONE WEEK, THEN INCREASE TO 2 CAP TWICE A DAY AND CONTINUE (Patient taking differently: Take 200 mg by mouth 2 (two) times daily) 120 capsule 3  . memantine  (NAMENDA ) 10 MG tablet TAKE 1 TABLET BY MOUTH TWICE  DAILY 180 tablet 1  . oxyCODONE  (ROXICODONE ) 5 MG immediate release tablet Take 5 mg by mouth every 6 (six) hours as needed.      . Compound Medication Med Name: 4-aminopyridine sustained-release capsule (Patient not taking: Reported on 10/14/2024)     No current facility-administered medications for this visit.    ALLERGIES Allergies  Allergen Reactions  . Tetanus Vaccines And Toxoid Swelling and Other (See Comments)    Pt states as well as fever and sweating. Other reaction(s): Other (See Comments) Pt states as well as fever and sweating. Other reaction(s): swelling  . Tetanus And Diphther. Tox (Pf) Swelling    Extreme swelling at injection site  . Sulfa (Sulfonamide Antibiotics) Diarrhea and Vomiting    REVIEW OF SYSTEMS:  13 system ROS was verbally reviewed with patient. Pertinent positives and negatives are mentioned above in the HPI and all other systems are negative.  EXAM   Vitals:   10/14/24 1436  BP: 108/66  Height: 165.1 cm (5' 5)  PainSc: 0-No pain     Body mass index is 33.28 kg/m.   GENERAL: Pleasant female. No acute distress.  Normocephalic and atraumatic.   The baseline comprehensive neurological exam obtained by Dr. Lane on 08/20/20 is included below.  Significant changes from today's visit are shown in bold.  MUSCULOSKELETAL: Bulk - Normal Tone  - Normal Pronator Drift - Absent bilaterally. Ambulation - Gait and station are impaired again today.  Using a wheelchair today.  R/L 4/4 Shoulder abduction (deltoid/supraspinatus, axillary/suprascapular n, C5) 4/4 Elbow flexion (biceps brachii, musculoskeletal n, C5-6) 4/4 Elbow extension (triceps, radial n, C7) 4/4 Finger adduction (interossei, ulnar n, T1)  4-/4- Hip flexion (iliopsoas, L1/L2) 4-/4- Knee flexion (hamstrings, sciatic n, L5/S1)  4/4 Knee extension (quadriceps, femoral n, L3/4) 4/4 Ankle dorsiflexion (tibialis anterior, deep fibular n, L4/5) 4/4 Ankle plantarflexion (gastroc, tibial n, S1)   NEUROLOGICAL: MENTAL STATUS: Patient is oriented to person, place and time.  Recent memory is again mildly reduced.  Remote memory is normal.  Attention span and concentration are intact. Naming, repetition, comprehension and expressive speech are within normal limits. Patient's fund of knowledge is normal for educational level.  CRANIAL NERVES:  Abnormal CN II (decreased visual acuity and mildly decreased visual fields) Normal CN III, IV, VI (extraocular muscles are intact) Normal CN V (facial sensation is intact bilaterally) Normal CN VII (facial strength is intact bilaterally) Normal CN VIII (hearing is intact bilaterally) Abnormal CN IX/X (palate elevates midline, mild dysarthria) Normal CN XI (shoulder shrug strength is normal and symmetric) Normal CN XII (tongue protrudes midline)  SENSATION: There is decreased sensation to all modalities in the legs noted again today. Otherwise, sensation is within normal limits to all modalities throughout again today.   REFLEXES: R/L 3+/3+ Biceps 3+/3+ Brachioradialis  3+/3+ Patellar 3+/3+ Achilles  COORDINATION/CEREBELLAR: Finger to nose testing is normal again today.  DATA  IMAGING:  10/23/2019 MRI BRAIN W AND WO  CLINICAL DATA:  Multiple sclerosis.  EXAM: MRI HEAD WITHOUT CONTRAST  TECHNIQUE: Multiplanar,  multiecho pulse sequences of the brain and surrounding structures were obtained without intravenous contrast.  COMPARISON:  11/06/2018  FINDINGS: Brain: Extensive chronic cerebral white matter disease is again seen including confluent T2 hyperintensity in the periventricular and deep cerebral white matter as well as multiple juxtacortical lesions. There are numerous black holes on T1 weighted imaging. The corpus callosum is involved with marked diffuse callosal atrophy. Numerous lesions involve the brainstem and cerebellum including middle cerebellar peduncles. Overall, no definite progression is identified. No lesions demonstrate restricted diffusion. Advanced cerebral atrophy is unchanged. No acute infarct, intracranial hemorrhage, mass, midline shift, or extra-axial fluid collection is identified.  Vascular: Major intracranial vascular flow voids are preserved.  Skull and upper cervical spine: No suspicious marrow lesion.  Sinuses/Orbits: Unremarkable orbits. Paranasal sinuses and mastoid air cells are clear.  Other: None.  IMPRESSION: Unchanged extensive chronic multiple sclerosis. No evidence of acute intracranial abnormality.   Electronically Signed   By: Dasie Hamburg M.D.   On: 10/23/2019 09:20  Other Result Information  Interface, Rad Results In - 10/23/2019  9:22 AM EDT CLINICAL DATA:  Multiple sclerosis.  EXAM: MRI HEAD WITHOUT CONTRAST  TECHNIQUE: Multiplanar, multiecho pulse sequences of the brain and surrounding structures were obtained without intravenous contrast.  COMPARISON:  11/06/2018  FINDINGS: Brain: Extensive chronic cerebral white matter disease is again seen including confluent T2 hyperintensity in the periventricular and deep cerebral white matter as well as multiple juxtacortical lesions. There are numerous black holes on T1 weighted imaging. The corpus callosum is involved with marked diffuse callosal atrophy. Numerous lesions involve  the brainstem and cerebellum including middle cerebellar peduncles. Overall, no definite progression is identified. No lesions demonstrate restricted diffusion. Advanced cerebral atrophy is unchanged. No acute infarct, intracranial hemorrhage, mass, midline shift, or extra-axial fluid collection is identified.  Vascular: Major intracranial vascular flow voids are preserved.  Skull and upper cervical spine: No suspicious marrow lesion.  Sinuses/Orbits: Unremarkable orbits. Paranasal sinuses and mastoid air cells are clear.  Other: None.  IMPRESSION: Unchanged extensive chronic multiple sclerosis. No evidence of acute intracranial abnormality.   Electronically Signed   By: Dasie Hamburg M.D.   On: 10/23/2019 09:20     __________________________________________________  10/282020 MRI CERVICAL SPINE CLINICAL DATA:  Multiple sclerosis  EXAM: MRI CERVICAL SPINE WITHOUT CONTRAST  TECHNIQUE: Multiplanar, multisequence MR imaging of the cervical spine was performed. No intravenous contrast was administered.  COMPARISON:  Cervical MRI 11/06/2018, 10/24/2017  FINDINGS: Alignment: Normal alignment with straightening of the cervical lordosis  Vertebrae: Normal bone marrow.  Negative for fracture or mass.  Cord: Ill-defined mild hyperintensity in the spinal cord at C4-5 is unchanged from prior studies.  There is associated spinal stenosis at this level.  Small cord lesion on the right at C2 appears unchanged.  Small cord lesion on the left at C6 is a possible lesion and not definitely seen previously.  Small lesion in the cord on the left at T1 is unchanged.  Posterior Fossa, vertebral arteries, paraspinal tissues: Abnormal signal in the pons and brachium pontis bilaterally compatible with chronic demyelinating disease as noted on prior brain MRI.  Disc levels:  C2-3: Negative  C3-4: Small central disc protrusion has progressed. Mild uncinate spurring with mild  foraminal narrowing bilaterally. Mild spinal stenosis  C4-5: Moderate disc degeneration and spondylosis unchanged. Cord flattening with moderate spinal stenosis unchanged. Moderate foraminal narrowing bilaterally unchanged. Cord signal hyperintensity bilaterally unchanged  C5-6: Disc degeneration and spondylosis. Moderate spinal stenosis and moderate foraminal stenosis bilaterally unchanged from prior study.  C6-7: Small right paracentral disc protrusion and spurring unchanged. Mild cord flattening and stenosis on the right. Neural foramina patent bilaterally  C7-T1: Negative  IMPRESSION: Multiple cord lesions are present similar to prior MRI and consistent with demyelinating disease. Hyperintensity in the cord at C4-5 is unchanged and associated with moderate spinal stenosis. This may be due to compressive myelopathy or demyelinating disease.  Central disc protrusion at C3-4 has progressed in the interval causing mild spinal stenosis and mild foraminal narrowing bilaterally. Other degenerative changes are stable from the prior MRI.   Electronically Signed   By: Carlin Gaskins M.D.   On: 10/23/2019 09:20  __________________________________________________   MRI CERVICAL SPINE W AND WITHOUT CONTRAST 11/06/2018  CLINICAL DATA:  63 year old female with chronic multiple sclerosis. Restaging.  EXAM: MRI HEAD WITHOUT AND WITH CONTRAST  MRI CERVICAL SPINE WITHOUT AND WITH CONTRAST  TECHNIQUE: Multiplanar, multiecho pulse sequences of the brain and surrounding structures, and cervical spine, to include the craniocervical junction and cervicothoracic junction, were obtained without and with intravenous contrast.  CONTRAST:  7 milliliters Gadavist   COMPARISON:  10/24/2017, brain MRI 07/14/2015.  FINDINGS: MRI HEAD FINDINGS  Brain: Chronically age advanced generalized brain volume loss with pronounced corpus callosum atrophy. Ex vacuo ventricular enlargement.  Extensive bilateral white matter T2 and FLAIR hyperintense lesions, with discrete cystic areas of encephalomalacia superimposed on patchy background signal abnormality. All lobes affected. Scattered areas of cortical involvement (e.g. Series 10, image 62). Asymmetric bilateral deep gray matter involvement, most pronounced in the left thalamus. Multifocal brainstem and extensive bilateral cerebellar peduncle involvement.  Areas of T2 shine through on diffusion weighted imaging. No restricted or enhancing lesions. No discrete progression since 2016.  No dural thickening. No restricted diffusion to suggest acute infarction. No midline shift, mass effect, evidence of mass lesion, ventriculomegaly, extra-axial collection or acute intracranial hemorrhage. Cervicomedullary junction and pituitary are within normal limits.  Vascular: Major intracranial vascular flow voids are stable. The major dural venous sinuses are enhancing and appear patent.  Skull and upper cervical spine: Cervical spine reported below. Visualized bone marrow signal is within normal limits.  Sinuses/Orbits: Orbit soft tissues appears stable and within normal limits. Paranasal Visualized paranasal sinuses and mastoids are stable and well pneumatized.  Other: Visible internal auditory structures appear normal. Scalp and face soft tissues appear negative.  MRI CERVICAL SPINE FINDINGS  Alignment: Stable mild straightening of cervical lordosis.  Vertebrae: No marrow edema or evidence of acute osseous abnormality. Visualized bone marrow signal is within normal limits.  Cord: Generalized heterogeneous cervical spinal cord T2 and STIR signal, most abnormal at the C4 and C5 levels. Generalized mild spinal cord  volume loss.  No abnormal intradural enhancement or discrete new cervical cord lesion.  Posterior Fossa, vertebral arteries, paraspinal tissues: Brain findings reported above. Preserved major vascular flow  voids in the neck, the right vertebral artery is dominant. Negative neck soft tissues and visible lung apices.  Disc levels: Cervical spine degeneration maximal at C4-C5 and C5-C6 is stable since 2018 with mild multifactorial spinal and moderate to severe bilateral C5 and C6 foraminal stenosis.  IMPRESSION: 1. Chronic severe demyelinating disease in the brain with no progression identified since 2016. 2. Chronic widespread heterogeneous cervical spinal cord demyelination with no progression identified. 3. Stable superimposed cervical spine degeneration maximal at C4-C5 and C5-C6.   Electronically Signed   By: VEAR Hurst M.D.   On: 11/06/2018 12:38 __________________________________________________  MRI BRAIN W AND WO CONTRAST 11/06/2018 CLINICAL DATA:  63 year old female with chronic multiple sclerosis. Restaging.  EXAM: MRI HEAD WITHOUT AND WITH CONTRAST  MRI CERVICAL SPINE WITHOUT AND WITH CONTRAST  TECHNIQUE: Multiplanar, multiecho pulse sequences of the brain and surrounding structures, and cervical spine, to include the craniocervical junction and cervicothoracic junction, were obtained without and with intravenous contrast.  CONTRAST:  7 milliliters Gadavist   COMPARISON:  10/24/2017, brain MRI 07/14/2015.  FINDINGS: MRI HEAD FINDINGS  Brain: Chronically age advanced generalized brain volume loss with pronounced corpus callosum atrophy. Ex vacuo ventricular enlargement. Extensive bilateral white matter T2 and FLAIR hyperintense lesions, with discrete cystic areas of encephalomalacia superimposed on patchy background signal abnormality. All lobes affected. Scattered areas of cortical involvement (e.g. Series 10, image 62). Asymmetric bilateral deep gray matter involvement, most pronounced in the left thalamus. Multifocal brainstem and extensive bilateral cerebellar peduncle involvement.  Areas of T2 shine through on diffusion weighted imaging. No restricted or  enhancing lesions. No discrete progression since 2016.  No dural thickening. No restricted diffusion to suggest acute infarction. No midline shift, mass effect, evidence of mass lesion, ventriculomegaly, extra-axial collection or acute intracranial hemorrhage. Cervicomedullary junction and pituitary are within normal limits.  Vascular: Major intracranial vascular flow voids are stable. The major dural venous sinuses are enhancing and appear patent.  Skull and upper cervical spine: Cervical spine reported below. Visualized bone marrow signal is within normal limits.  Sinuses/Orbits: Orbit soft tissues appears stable and within normal limits. Paranasal Visualized paranasal sinuses and mastoids are stable and well pneumatized.  Other: Visible internal auditory structures appear normal. Scalp and face soft tissues appear negative.  MRI CERVICAL SPINE FINDINGS  Alignment: Stable mild straightening of cervical lordosis.  Vertebrae: No marrow edema or evidence of acute osseous abnormality. Visualized bone marrow signal is within normal limits.  Cord: Generalized heterogeneous cervical spinal cord T2 and STIR signal, most abnormal at the C4 and C5 levels. Generalized mild spinal cord volume loss.  No abnormal intradural enhancement or discrete new cervical cord lesion.  Posterior Fossa, vertebral arteries, paraspinal tissues: Brain findings reported above. Preserved major vascular flow voids in the neck, the right vertebral artery is dominant. Negative neck soft tissues and visible lung apices.  Disc levels: Cervical spine degeneration maximal at C4-C5 and C5-C6 is stable since 2018 with mild multifactorial spinal and moderate to severe bilateral C5 and C6 foraminal stenosis.  IMPRESSION: 1. Chronic severe demyelinating disease in the brain with no progression identified since 2016. 2. Chronic widespread heterogeneous cervical spinal cord demyelination with no progression  identified. 3. Stable superimposed cervical spine degeneration maximal at C4-C5 and C5-C6. __________________________________________________  MR BRAIN W WO CONTRAST 10/24/2017:  CLINICAL DATA:  Followup multiple  sclerosis.  No new symptoms.  EXAM: MRI HEAD WITHOUT AND WITH CONTRAST  TECHNIQUE: Multiplanar, multiecho pulse sequences of the brain and surrounding structures were obtained without and with intravenous contrast.  CONTRAST:  13mL MULTIHANCE  GADOBENATE DIMEGLUMINE  529 MG/ML IV SOLN  COMPARISON:  07/13/2005  FINDINGS: Brain: Diffusion imaging does not show any acute or subacute infarction. 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 There chronic white matter lesions throughout the pons, middle cerebellar peduncle S, and cerebral hemispheric white matter consistent with extensive chronic demyelinating disease. There are no new or progressive lesions. No lesions show restricted diffusion or contrast enhancement. Ventricles are enlarged consistent with central atrophy. No suspicion of obstructive hydrocephalus. No mass or extra-axial collection.  Vascular: Major vessels at the base of the brain show flow.  Skull and upper cervical spine: See results of cervical spine exam.  Sinuses/Orbits: Negative  Other: None  IMPRESSION: Stable examination since 2016. Extensive widespread demyelinating lesions throughout the brain a generalized atrophy. No new or progressive lesions. No lesions show restricted diffusion or contrast enhancement. __________________________________________________  MR CERVICAL SPINE WO CONTRAST 10/24/2017:  CLINICAL DATA:  Followup multiple sclerosis.  EXAM: MRI CERVICAL SPINE WITHOUT CONTRAST  TECHNIQUE: Multiplanar, multisequence MR imaging of the cervical spine was performed. No intravenous contrast was administered.  COMPARISON:  10/26/2013  FINDINGS: Alignment: Normal  Vertebrae: No primary bone lesion.  Cord:  Abnormal T2 signal within the cord from mid C4 to mid C5, similar to the previous study, which could indicate the region of the cord of bulbi chronic demyelination. Some potential that this could be compressive myelopathy as well. See below.  Posterior Fossa, vertebral arteries, paraspinal tissues: See results of intracranial exam. Soft tissues of the neck. Negative.  Disc levels:  Foramen magnum widely patent.  C1-2 and C2-3 are normal.  C3-4: Central disc herniation effaces the ventral subarachnoid space and contacts the cord. No cord compression. No foraminal extension.  C4-5: Spondylosis with endplate osteophytes and protruding disc material more prominent towards the left. Effacement of the subarachnoid space with indentation of the cord. AP diameter of the canal as narrow as 7 mm. Abnormal T2 signal in the cord which could be due to chronic demyelinating disease or could be due to the compressive pathology. Foraminal stenosis bilaterally that could affect either C5 nerve.  C5-6: Spondylosis with endplate osteophytes and protruding disc material. Effacement of the subarachnoid space. AP diameter of the canal as narrow as 7 mm. Bilateral foraminal stenosis that could affect either C6 nerve.  C6-7: Spondylosis with endplate osteophytes and a shallow right paracentral disc herniation. Effacement of the ventral subarachnoid space with contact of the cord. AP diameter of the canal as narrow as 8 mm. No foraminal stenosis.  C7-T1: Normal.  IMPRESSION: C3-4: Central disc herniation which effaces the ventral subarachnoid space and contacts the cord. No foraminal compromise.  C4-5: Chronic spondylosis with endplate osteophytes and protruding disc material. Spinal stenosis with AP diameter of canal only 7 mm. Indentation of the cord. Bilateral neural foraminal stenosis. Abnormal T2 signal affecting the cord from mid C4 to mid C5 as seen previously. This could be due to  demyelinating disease or could be compressive myelopathy. Similar appearance to the previous exam.  C5-6: Spondylosis with canal stenosis and bilateral foraminal stenosis. AP diameter of the canal only 7 mm. No abnormal cord signal centered at this disc space.  C6-7: Spondylosis with right paracentral disc herniation. Canal AP diameter as narrow as 8 mm. No foraminal stenosis. __________________________________________________  07/14/2015  Multiple sclerosis.  Followup in evaluate treatment.  EXAM: MRI HEAD WITHOUT AND WITH CONTRAST  TECHNIQUE: Multiplanar, multiecho pulse sequences of the brain and surrounding structures were obtained without and with intravenous contrast.  CONTRAST:  13mL MULTIHANCE  GADOBENATE DIMEGLUMINE  529 MG/ML IV SOLN  COMPARISON:  05/30/2014  FINDINGS: Calvarium and upper cervical spine: No focal marrow signal abnormality.  Orbits: No significant findings.  Sinuses and Mastoids: Clear.  Brain: Stable extensive cerebral white matter signal abnormality, present periventricular, deep, and juxta cortical. There is also extensive infratentorial disease involving the brainstem and bilateral cerebellar peduncles. Advanced brain atrophy with corpus callosum thinning and wallerian degeneration in the brainstem. The left cerebral peduncle is more atrophic than the right, correlating with a large focus of chronic demyelination in the central left corona radiata. There is no diffusion or contrast signal abnormality to suggest active demyelination. Black holes are present diffusely. Stable CSF collection below the mildly up lifted vermis. No evidence of superimposed acute infarct, hemorrhage, hydrocephalus, or major vessel occlusion.  IMPRESSION: Multiple sclerosis with extensive disease and advanced atrophy. No progression from 2015 or evidence of active inflammation.  06/19/2014 MRI HEAD WITHOUT AND WITH CONTRAST  TECHNIQUE: Multiplanar, multiecho  pulse sequences of the brain and surrounding structures were obtained without and with intravenous contrast.  CONTRAST: 13mL MULTIHANCE  GADOBENATE DIMEGLUMINE  529 MG/ML IV SOLN  COMPARISON: MRI brain without and with contrast 10/26/2013.  FINDINGS: Moderate atrophy and diffuse white matter changes are similar to the prior exam. There is marked atrophy of the corpus callosum. No acute infarct, hemorrhage, or mass lesion is present. The ventricles are proportionate to the degree of atrophy and stable  Flow is present in the major intracranial arteries. The globes and orbits are intact. The paranasal sinuses and mastoid air cells are clear.  Postcontrast images demonstrate no pathologic enhancement.  IMPRESSION: 1. Stable diffuse white matter changes compatible with multiple sclerosis. 2. Marked atrophy of the corpus callosum. 3. Diffuse atrophy is likely related to multiple sclerosis. 4. No acute intracranial abnormality __________________________________________________ 10/26/13 Brain MRI Multiple enhancing subcortical and periventricular lesions are noted, some showing ring enhancement, most with punctate enhancement. There is also noncommunicating hydrocephalus out of proportion to the degree of sulcal atrophy.  04/02/2013 MRI c-spine w/ and w/o contrast Impression: 1) Abnormal cord signal C4 through C7. Some or all of this could be due to a mass. 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 Findings are similar at every level to the central and foraminal stenosis. Findings atre similar at every level to the previous study with the exception of C6-7 where findings appear to have progressed slightly. Multilevel bilateral foraminal stenosis.  MRI of brain w/ and w/o  contrast Impression:  1) Multiple new lesions seen which have somewhat rounded configuration. Many of the new lesions show complete ring-enhancement. Some show enhancement in the midportions. 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 Alternate considerations would be encephalomyelitis, neoplasm/metastatic disease/ lymphoma, multiple abscesses. 2) Many of the old and new lesions show encephalmalacic change 3) Atrophy 4) Atrophy of corpus callosum 5) To attempt to differentiate tumefactive MS from other etiologies of these new findings,may want to refer patient to a center that performs perfusion imaging __________________________________________________  08/28/2012 MRI of brain w/ and w/o contrast  Impression: 1) Numerous abnormal white matter lesions in the subcortical white matter of both hemispheres, periventricular white matter, centrum semiovale bilaterally, cerebellum, and cerebellar peduncles, as well as the pons. Abnormal signal is also seen in the corpus callosum is atrophic. Several of these lesions show encephalomalacic change. No definite abnormal enhancement is aprreciated. Several of the lesions also show abnormal signal and diffusion-weighted lesions 2) Atrophy and ventriculomegaly  MRI of cervical spine w/ and w/o contrast Impression: 1) Ill-defined faint area of abnormal cord signal at C4-5 anteriorly on the sagittal view only. Could represent demylinating plaque but could also represent cord edema as there is central canal stenosis and cord compression at this level which could result in abnormal cord signal 2) Straightening of normal c-spine lordosis. Congenitally small neural canal. 3) Multilevel degnerative disc disease and spondylosis. Multilevel central canal stenosis which is most severe at C4-5, C5-6, C6-7 where there is cord compression at each of these levels. 4) Multilevel bilateral foraminal  stenosis  LABS  No visits with results within 6 Month(s) from this visit.  Latest known visit with results is:  Appointment on 02/16/2023  Component Date Value Ref Range Status  . WBC (White Blood Cell Count) 02/16/2023 8.9  4.1 - 10.2 10^3/uL Final  . RBC (Red Blood Cell Count) 02/16/2023 4.63  4.04 - 5.48 10^6/uL Final  . Hemoglobin 02/16/2023 13.4  12.0 - 15.0 gm/dL Final  . Hematocrit 97/77/7975 41.2  35.0 - 47.0 % Final  . MCV (Mean Corpuscular Volume) 02/16/2023 89.0  80.0 - 100.0 fl Final  . MCH (Mean Corpuscular Hemoglobin) 02/16/2023 28.9  27.0 - 31.2 pg Final  . MCHC (Mean Corpuscular Hemoglobin * 02/16/2023 32.5  32.0 - 36.0 gm/dL Final  . Platelet Count 02/16/2023 280  150 - 450 10^3/uL Final  . RDW-CV (Red Cell Distribution Widt* 02/16/2023 13.4  11.6 - 14.8 % Final  . MPV (Mean Platelet Volume) 02/16/2023 11.4  9.4 - 12.4 fl Final  . Neutrophils 02/16/2023 6.75  1.50 - 7.80 10^3/uL Final  . Lymphocytes 02/16/2023 1.35  1.00 - 3.60 10^3/uL Final  . Monocytes 02/16/2023 0.55  0.00 - 1.50 10^3/uL Final  . Eosinophils 02/16/2023 0.13  0.00 - 0.55 10^3/uL Final  . Basophils 02/16/2023 0.05  0.00 - 0.09 10^3/uL Final  . Neutrophil % 02/16/2023 76.2 (H)  32.0 - 70.0 % Final  . Lymphocyte % 02/16/2023 15.2  10.0 - 50.0 % Final  . Monocyte % 02/16/2023 6.2  4.0 - 13.0 % Final  . Eosinophil % 02/16/2023 1.5  1.0 - 5.0 % Final  . Basophil% 02/16/2023 0.6  0.0 - 2.0 % Final  . Immature Granulocyte % 02/16/2023 0.3  <=0.7 % Final  . Immature Granulocyte Count 02/16/2023 0.03  <=0.06 10^3/L Final  . Glucose 02/16/2023 93  70 - 110 mg/dL Final  . Sodium 97/77/7975 142  136 - 145 mmol/L Final  . Potassium 02/16/2023 4.5  3.6 - 5.1 mmol/L Final  . Chloride 02/16/2023 106  97 - 109 mmol/L Final  . Carbon Dioxide (CO2) 02/16/2023 32.3 (H)  22.0 - 32.0 mmol/L Final  . Urea  Nitrogen (BUN) 02/16/2023 16  7 - 25 mg/dL Final  . Creatinine 97/77/7975 1.0  0.6 - 1.1 mg/dL Final  .  Glomerular Filtration Rate (eGFR) 02/16/2023 64  >60 mL/min/1.73sq m Final  . Calcium 02/16/2023 9.2  8.7 - 10.3 mg/dL Final  . AST  97/77/7975 16  8 - 39 U/L Final  . ALT  02/16/2023 15  5 -  38 U/L Final  . Alk Phos (alkaline Phosphatase) 02/16/2023 120 (H)  34 - 104 U/L Final  . Albumin 02/16/2023 4.1  3.5 - 4.8 g/dL Final  . Bilirubin, Total 02/16/2023 0.4  0.3 - 1.2 mg/dL Final  . Protein, Total 02/16/2023 6.4  6.1 - 7.9 g/dL Final  . A/G Ratio 97/77/7975 1.8  1.0 - 5.0 gm/dL Final    No follow-ups on file.  This note is partially written by Roe Mater, in the presence of and acting as the scribe of Dr. Arthea Farrow.    I have reviewed, edited and added to the note as needed to reflect my best personal medical judgment.    Dr. Arthea Farrow, MD [facility] [place], [place] Ph:  [phone] Fax:  [phone]

## 2024-11-07 NOTE — Progress Notes (Signed)
 Cath Change/ Replacement  Patient is present today for a catheter change due to urinary retention.  18 ml of water was removed from the balloon, a 16 FR Silastic foley cath was removed without difficulty.  Patient was cleaned and prepped in a sterile fashion with betadine and 2% lidocaine  jelly was instilled into the urethra. A 16 FR Silastic foley cath was replaced into the bladder, no complications were noted. Urine return was noted 50 ml and urine was yellow in color. The balloon was filled with 10ml of sterile water. A leg bag was attached for drainage.  A night bag was also given to the patient and patient was given instruction on how to change from one bag to another. Patient was given proper instruction on catheter care.    Performed by: CLOTILDA CORNWALL, PA-C and Andrea DELENA Kirks, LPN   Follow up: Return in about 6 weeks (around 12/23/2024) for Foley catheter exchange .

## 2024-11-11 ENCOUNTER — Ambulatory Visit: Admitting: Urology

## 2024-11-11 VITALS — BP 104/71 | HR 68 | Wt 195.0 lb

## 2024-11-11 DIAGNOSIS — N319 Neuromuscular dysfunction of bladder, unspecified: Secondary | ICD-10-CM

## 2024-12-23 ENCOUNTER — Ambulatory Visit: Admitting: Urology

## 2024-12-30 ENCOUNTER — Ambulatory Visit: Admitting: Urology

## 2025-01-23 NOTE — Progress Notes (Unsigned)
 Cath Change/ Replacement  Patient is present today for a catheter change due to urinary retention.  ***ml of water was removed from the balloon, a ***FR foley cath was removed without difficulty.  Patient was cleaned and prepped in a sterile fashion with betadine and 2% lidocaine  jelly was instilled into the urethra. A *** FR foley cath was replaced into the bladder, {dnt [id number]}. Urine return was noted ***ml and urine was *** in color. The balloon was filled with 10ml of sterile water. A *** bag was attached for drainage.  A night bag was also given to the patient and patient was given instruction on how to change from one bag to another. Patient was given proper instruction on catheter care.    Performed by: ***  Follow up: No follow-ups on file.

## 2025-01-24 ENCOUNTER — Ambulatory Visit: Admitting: Urology

## 2025-01-24 DIAGNOSIS — N319 Neuromuscular dysfunction of bladder, unspecified: Secondary | ICD-10-CM

## 2025-01-31 NOTE — Progress Notes (Unsigned)
 Cath Change/ Replacement  Patient is present today for a catheter change due to urinary retention.  ***ml of water was removed from the balloon, a ***FR foley cath was removed without difficulty.  Patient was cleaned and prepped in a sterile fashion with betadine and 2% lidocaine  jelly was instilled into the urethra. A *** FR foley cath was replaced into the bladder, {dnt [id number]}. Urine return was noted ***ml and urine was *** in color. The balloon was filled with 10ml of sterile water. A *** bag was attached for drainage.  A night bag was also given to the patient and patient was given instruction on how to change from one bag to another. Patient was given proper instruction on catheter care.    Performed by: ***  Follow up: No follow-ups on file.

## 2025-02-03 ENCOUNTER — Ambulatory Visit: Admitting: Urology

## 2025-02-03 DIAGNOSIS — N319 Neuromuscular dysfunction of bladder, unspecified: Secondary | ICD-10-CM
